# Patient Record
Sex: Female | Born: 1995 | Race: Asian | Hispanic: No | Marital: Single | Smoking: Never smoker
Health system: Southern US, Community
[De-identification: ages and names within clinical notes are randomized; demographics above are authoritative.]

## PROBLEM LIST (undated history)

## (undated) ENCOUNTER — Inpatient Hospital Stay (HOSPITAL_COMMUNITY): Payer: Self-pay

## (undated) DIAGNOSIS — R51 Headache: Secondary | ICD-10-CM

## (undated) DIAGNOSIS — F329 Major depressive disorder, single episode, unspecified: Secondary | ICD-10-CM

## (undated) DIAGNOSIS — R519 Headache, unspecified: Secondary | ICD-10-CM

## (undated) DIAGNOSIS — R87629 Unspecified abnormal cytological findings in specimens from vagina: Secondary | ICD-10-CM

## (undated) DIAGNOSIS — F419 Anxiety disorder, unspecified: Secondary | ICD-10-CM

## (undated) DIAGNOSIS — F32A Depression, unspecified: Secondary | ICD-10-CM

## (undated) DIAGNOSIS — I10 Essential (primary) hypertension: Secondary | ICD-10-CM

## (undated) DIAGNOSIS — O139 Gestational [pregnancy-induced] hypertension without significant proteinuria, unspecified trimester: Secondary | ICD-10-CM

## (undated) HISTORY — PX: OTHER SURGICAL HISTORY: SHX169

## (undated) HISTORY — DX: Depression, unspecified: F32.A

## (undated) HISTORY — DX: Essential (primary) hypertension: I10

## (undated) HISTORY — DX: Major depressive disorder, single episode, unspecified: F32.9

## (undated) HISTORY — DX: Unspecified abnormal cytological findings in specimens from vagina: R87.629

## (undated) HISTORY — DX: Headache: R51

## (undated) HISTORY — DX: Headache, unspecified: R51.9

## (undated) HISTORY — DX: Gestational (pregnancy-induced) hypertension without significant proteinuria, unspecified trimester: O13.9

## (undated) HISTORY — DX: Anxiety disorder, unspecified: F41.9

---
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
## 2014-01-11 ENCOUNTER — Encounter: Payer: Self-pay | Admitting: Obstetrics & Gynecology

## 2014-01-11 ENCOUNTER — Ambulatory Visit (INDEPENDENT_AMBULATORY_CARE_PROVIDER_SITE_OTHER): Payer: Medicaid Other | Admitting: Obstetrics & Gynecology

## 2014-01-11 ENCOUNTER — Ambulatory Visit: Payer: Medicaid Other | Admitting: Obstetrics & Gynecology

## 2014-01-11 VITALS — BP 114/86 | Temp 98.5°F | Ht 61.0 in | Wt 221.0 lb

## 2014-01-11 DIAGNOSIS — Z09 Encounter for follow-up examination after completed treatment for conditions other than malignant neoplasm: Secondary | ICD-10-CM

## 2014-01-11 NOTE — Progress Notes (Signed)
Patient ID: Monica [name], female   DOB: 04/09/1996, 18 y.o.   MRN: [id number]  Chief Complaint  Patient presents with  . Nexplanon Stitches Removal    HPI Monica [name] is a 18 y.o. female.  HPI  History reviewed. No pertinent past medical history.  Past Surgical History  Procedure Laterality Date  . Knee srthoscopy N/A   . Arthoscopic knee Right 2013  . Cesarean section N/A 11/05/2012    Procedure: CESAREAN SECTION;  Surgeon: Antionette CharLisa Jackson-Moore, MD;  Location: [facility];  Service: Obstetrics;  Laterality: N/A;    Family History  Problem Relation Age of Onset  . Heart attack Maternal Grandmother   . Hypertension Maternal Grandmother   . Cancer Neg Hx   . Diabetes Neg Hx   . Healthy Brother     x1  . Healthy Sister     x1`    Social History History  Substance Use Topics  . Smoking status: Never Smoker   . Smokeless tobacco: Never Used  . Alcohol Use: Yes     Comment: sometimes     Allergies  Allergen Reactions  . Shellfish Allergy Hives and Rash    Current Outpatient Prescriptions  Medication Sig Dispense Refill  . cetirizine (ZYRTEC) 10 MG tablet Take 10 mg by mouth daily.       No current facility-administered medications for this visit.    Review of Systems Review of Systems Constitutional: negative for fatigue and weight loss Respiratory: negative for cough and wheezing Cardiovascular: negative for chest pain, fatigue and palpitations Gastrointestinal: negative for abdominal pain and change in bowel habits Genitourinary:negative for abnormal vaginal discharge Integument/breast: negative for nipple discharge Musculoskeletal: positive for pain radiating down back/buttock Neurological: negative for gait problems and tremors Behavioral/Psych: negative for abusive relationship, depression Endocrine: negative for temperature intolerance     Blood pressure 114/86, temperature 98.5 F (36.9 C), height 5\' 1"  (1.549 m), weight 100.245 kg (221 lb), not currently  breastfeeding.  Physical Exam Physical Exam General:   alert  Skin:   Nexplanon site healing well; suture removed      Data Reviewed None  Assessment    S/P suture removal ?radicular back pain     Plan    Possible management options include: supportive measures reviewed Follow up as needed.         JACKSON-MOORE,Monica [name] A 01/11/2014, 3:40 PM

## 2014-01-11 NOTE — Patient Instructions (Signed)
Sciatica Sciatica is pain, weakness, numbness, or tingling along the path of the sciatic nerve. The nerve starts in the lower back and runs down the back of each leg. The nerve controls the muscles in the lower leg and in the back of the knee, while also providing sensation to the back of the thigh, lower leg, and the sole of your foot. Sciatica is a symptom of another medical condition. For instance, nerve damage or certain conditions, such as a herniated disk or bone spur on the spine, pinch or put pressure on the sciatic nerve. This causes the pain, weakness, or other sensations normally associated with sciatica. Generally, sciatica only affects one side of the body. CAUSES   Herniated or slipped disc.  Degenerative disk disease.  A pain disorder involving the narrow muscle in the buttocks (piriformis syndrome).  Pelvic injury or fracture.  Pregnancy.  Tumor (rare). SYMPTOMS  Symptoms can vary from mild to very severe. The symptoms usually travel from the low back to the buttocks and down the back of the leg. Symptoms can include:  Mild tingling or dull aches in the lower back, leg, or hip.  Numbness in the back of the calf or sole of the foot.  Burning sensations in the lower back, leg, or hip.  Sharp pains in the lower back, leg, or hip.  Leg weakness.  Severe back pain inhibiting movement. These symptoms may get worse with coughing, sneezing, laughing, or prolonged sitting or standing. Also, being overweight may worsen symptoms. DIAGNOSIS  Your caregiver will perform a physical exam to look for common symptoms of sciatica. He or she may ask you to do certain movements or activities that would trigger sciatic nerve pain. Other tests may be performed to find the cause of the sciatica. These may include:  Blood tests.  X-rays.  Imaging tests, such as an MRI or CT scan. TREATMENT  Treatment is directed at the cause of the sciatic pain. Sometimes, treatment is not necessary  and the pain and discomfort goes away on its own. If treatment is needed, your caregiver may suggest:  Over-the-counter medicines to relieve pain.  Prescription medicines, such as anti-inflammatory medicine, muscle relaxants, or narcotics.  Applying heat or ice to the painful area.  Steroid injections to lessen pain, irritation, and inflammation around the nerve.  Reducing activity during periods of pain.  Exercising and stretching to strengthen your abdomen and improve flexibility of your spine. Your caregiver may suggest losing weight if the extra weight makes the back pain worse.  Physical therapy.  Surgery to eliminate what is pressing or pinching the nerve, such as a bone spur or part of a herniated disk. HOME CARE INSTRUCTIONS   Only take over-the-counter or prescription medicines for pain or discomfort as directed by your caregiver.  Apply ice to the affected area for 20 minutes, 3-4 times a day for the first 48-72 hours. Then try heat in the same way.  Exercise, stretch, or perform your usual activities if these do not aggravate your pain.  Attend physical therapy sessions as directed by your caregiver.  Keep all follow-up appointments as directed by your caregiver.  Do not wear high heels or shoes that do not provide proper support.  Check your mattress to see if it is too soft. A firm mattress may lessen your pain and discomfort. SEEK IMMEDIATE MEDICAL CARE IF:   You lose control of your bowel or bladder (incontinence).  You have increasing weakness in the lower back, pelvis, buttocks,   it is too soft. A firm mattress may lessen your pain and discomfort.  SEEK IMMEDIATE MEDICAL CARE IF:   · You lose control of your bowel or bladder (incontinence).  · You have increasing weakness in the lower back, pelvis, buttocks, or legs.  · You have redness or swelling of your back.  · You have a burning sensation when you urinate.  · You have pain that gets worse when you lie down or awakens you at night.  · Your pain is worse than you have experienced in the past.  · Your pain is lasting longer than 4 weeks.  · You are suddenly losing weight without reason.  MAKE SURE YOU:  · Understand these  instructions.  · Will watch your condition.  · Will get help right away if you are not doing well or get worse.  Document Released: 05/06/2001 Document Revised: 11/11/2011 Document Reviewed: 09/21/2011  Exit[employer]® Patient Information ©2015 [employer]. This information is not intended to replace advice given to you by your health care provider. Make sure you discuss any questions you have with your health care provider.  Sciatica  Sciatica is pain, weakness, numbness, or tingling along the path of the sciatic nerve. The nerve starts in the lower back and runs down the back of each leg. The nerve controls the muscles in the lower leg and in the back of the knee, while also providing sensation to the back of the thigh, lower leg, and the sole of your foot. Sciatica is a symptom of another medical condition. For instance, nerve damage or certain conditions, such as a herniated disk or bone spur on the spine, pinch or put pressure on the sciatic nerve. This causes the pain, weakness, or other sensations normally associated with sciatica. Generally, sciatica only affects one side of the body.  CAUSES   · Herniated or slipped disc.  · Degenerative disk disease.  · A pain disorder involving the narrow muscle in the buttocks (piriformis syndrome).  · Pelvic injury or fracture.  · Pregnancy.  · Tumor (rare).  SYMPTOMS   Symptoms can vary from mild to very severe. The symptoms usually travel from the low back to the buttocks and down the back of the leg. Symptoms can include:  · Mild tingling or dull aches in the lower back, leg, or hip.  · Numbness in the back of the calf or sole of the foot.  · Burning sensations in the lower back, leg, or hip.  · Sharp pains in the lower back, leg, or hip.  · Leg weakness.  · Severe back pain inhibiting movement.  These symptoms may get worse with coughing, sneezing, laughing, or prolonged sitting or standing. Also, being overweight may worsen symptoms.  DIAGNOSIS   Your caregiver will  perform a physical exam to look for common symptoms of sciatica. He or she may ask you to do certain movements or activities that would trigger sciatic nerve pain. Other tests may be performed to find the cause of the sciatica. These may include:  · Blood tests.  · X-rays.  · Imaging tests, such as an MRI or CT scan.  TREATMENT   Treatment is directed at the cause of the sciatic pain. Sometimes, treatment is not necessary and the pain and discomfort goes away on its own. If treatment is needed, your caregiver may suggest:  · Over-the-counter medicines to relieve pain.  · Prescription medicines, such as anti-inflammatory medicine, muscle relaxants, or narcotics.  · Applying heat or   ice to the painful area.  · Steroid injections to lessen pain, irritation, and inflammation around the nerve.  · Reducing activity during periods of pain.  · Exercising and stretching to strengthen your abdomen and improve flexibility of your spine. Your caregiver may suggest losing weight if the extra weight makes the back pain worse.  · Physical therapy.  · Surgery to eliminate what is pressing or pinching the nerve, such as a bone spur or part of a herniated disk.  HOME CARE INSTRUCTIONS   · Only take over-the-counter or prescription medicines for pain or discomfort as directed by your caregiver.  · Apply ice to the affected area for 20 minutes, 3-4 times a day for the first 48-72 hours. Then try heat in the same way.  · Exercise, stretch, or perform your usual activities if these do not aggravate your pain.  · Attend physical therapy sessions as directed by your caregiver.  · Keep all follow-up appointments as directed by your caregiver.  · Do not wear high heels or shoes that do not provide proper support.  · Check your mattress to see if it is too soft. A firm mattress may lessen your pain and discomfort.  SEEK IMMEDIATE MEDICAL CARE IF:   · You lose control of your bowel or bladder (incontinence).  · You have increasing weakness in  the lower back, pelvis, buttocks, or legs.  · You have redness or swelling of your back.  · You have a burning sensation when you urinate.  · You have pain that gets worse when you lie down or awakens you at night.  · Your pain is worse than you have experienced in the past.  · Your pain is lasting longer than 4 weeks.  · You are suddenly losing weight without reason.  MAKE SURE YOU:  · Understand these instructions.  · Will watch your condition.  · Will get help right away if you are not doing well or get worse.  Document Released: 05/06/2001 Document Revised: 11/11/2011 Document Reviewed: 09/21/2011  [employer]® Patient Information ©2015 [employer]. This information is not intended to replace advice given to you by your health care provider. Make sure you discuss any questions you have with your health care provider.

## 2014-01-23 ENCOUNTER — Ambulatory Visit: Payer: Medicaid Other | Admitting: Obstetrics & Gynecology

## 2014-01-24 ENCOUNTER — Telehealth: Payer: Self-pay | Admitting: *Deleted

## 2014-01-24 NOTE — Telephone Encounter (Signed)
Error

## 2014-01-25 ENCOUNTER — Other Ambulatory Visit: Payer: Self-pay | Admitting: Obstetrics & Gynecology

## 2014-01-25 ENCOUNTER — Ambulatory Visit (INDEPENDENT_AMBULATORY_CARE_PROVIDER_SITE_OTHER): Payer: Medicaid Other | Admitting: Obstetrics & Gynecology

## 2014-01-25 VITALS — BP 113/77 | HR 88 | Temp 98.4°F | Wt 223.0 lb

## 2014-01-25 DIAGNOSIS — Z3049 Encounter for surveillance of other contraceptives: Secondary | ICD-10-CM

## 2014-01-25 DIAGNOSIS — N939 Abnormal uterine and vaginal bleeding, unspecified: Secondary | ICD-10-CM

## 2014-01-25 DIAGNOSIS — N926 Irregular menstruation, unspecified: Secondary | ICD-10-CM

## 2014-01-25 DIAGNOSIS — Z30013 Encounter for initial prescription of injectable contraceptive: Secondary | ICD-10-CM

## 2014-01-25 DIAGNOSIS — Z30432 Encounter for removal of intrauterine contraceptive device: Secondary | ICD-10-CM

## 2014-01-25 MED ORDER — MEDROXYPROGESTERONE ACETATE 150 MG/ML IM SUSP: 150.0000 mg | INTRAMUSCULAR | Status: DC

## 2014-01-25 MED ORDER — MEDROXYPROGESTERONE ACETATE 150 MG/ML IM SUSP
150.0000 mg | INTRAMUSCULAR | Status: AC
  Administered 2014-01-25 – 2014-10-03 (×3): 150 mg via INTRAMUSCULAR

## 2014-01-26 LAB — WET PREP BY MOLECULAR PROBE
Candida species: NEGATIVE
Gardnerella vaginalis: POSITIVE — AB
Trichomonas vaginosis: NEGATIVE

## 2014-01-26 LAB — GC/CHLAMYDIA PROBE AMP
CT Probe RNA: NEGATIVE
GC Probe RNA: NEGATIVE

## 2014-01-27 ENCOUNTER — Encounter: Payer: Self-pay | Admitting: Obstetrics & Gynecology

## 2014-01-27 NOTE — Progress Notes (Signed)
Patient ID: Monica Monica [name], female   DOB: 16-Sep-1995, 18 y.o.   MRN: [id number]  Chief Complaint  Patient presents with  . Menstrual Problem    bleeding with IUD    HPI Monica Monica [name] is Monica [name] 18 y.o. female.  Vaginal Bleeding The patient's primary symptoms include pelvic pain and vaginal bleeding. This is Monica [name] new problem. The current episode started more than 1 month ago. The problem occurs constantly. The problem has been unchanged. The pain is moderate. The problem affects both sides. Pertinent negatives include no constipation, diarrhea, discolored urine, frequency, nausea, painful intercourse or urgency. The vaginal bleeding is lighter than menses. She has not been passing clots. She has not been passing tissue. She has tried nothing for the symptoms. She uses an IUD for contraception. Her menstrual history has been irregular.    No past medical history on file.  Past Surgical History  Procedure Laterality Date  . Knee srthoscopy N/Monica [name]   . Arthoscopic knee Right 2013  . Cesarean section N/Monica [name] 11/05/2012    Procedure: CESAREAN SECTION;  Surgeon: Antionette Char, MD;  Location: [facility];  Service: Obstetrics;  Laterality: N/Monica [name];    Family History  Problem Relation Age of Onset  . Heart attack Maternal Grandmother   . Hypertension Maternal Grandmother   . Cancer Neg Hx   . Diabetes Neg Hx   . Healthy Brother     x1  . Healthy Sister     x1`    Social History History  Substance Use Topics  . Smoking status: Never Smoker   . Smokeless tobacco: Never Used  . Alcohol Use: Yes     Comment: sometimes     Allergies  Allergen Reactions  . Shellfish Allergy Hives and Rash    Current Outpatient Prescriptions  Medication Sig Dispense Refill  . cetirizine (ZYRTEC) 10 MG tablet Take 10 mg by mouth daily.      . medroxyPROGESTERone (DEPO-PROVERA) 150 MG/ML injection Inject 1 mL (150 mg total) into the muscle every 3 (three) months.  1 mL  3   Current Facility-Administered Medications   Medication Dose Route Frequency Provider Last Rate Last Dose  . medroxyPROGESTERone (DEPO-PROVERA) injection 150 mg  150 mg Intramuscular Q90 days Antionette Char, MD   150 mg at 01/25/14 1737    Review of Systems Review of Systems  Gastrointestinal: Negative for nausea, diarrhea and constipation.  Genitourinary: Positive for vaginal bleeding and pelvic pain. Negative for urgency and frequency.   Constitutional: negative for fatigue and weight loss Respiratory: negative for cough and wheezing Cardiovascular: negative for chest pain, fatigue and palpitations Gastrointestinal: negative for abdominal pain and change in bowel habits Integument/breast: negative for nipple discharge Musculoskeletal:negative for myalgias Neurological: negative for gait problems and tremors Behavioral/Psych: negative for abusive relationship, depression Endocrine: negative for temperature intolerance     Blood pressure 113/77, pulse 88, temperature 98.4 F (36.9 C), weight 101.152 kg (223 lb), last menstrual period 01/18/2014, not currently breastfeeding.  Physical Exam Physical Exam General:   alert  Skin:   no rash or abnormalities  Lungs:   clear to auscultation bilaterally  Heart:   regular rate and rhythm, S1, S2 normal, no murmur, click, rub or gallop  Abdomen:  normal findings: no organomegaly, soft, non-tender and no hernia  Pelvis:  External genitalia: normal general appearance Urinary system: urethral meatus normal and bladder without fullness, nontender Vaginal: normal without tenderness, induration or masses Cervix: normal appearance; IUD strings seen Adnexa: normal bimanual exam Uterus: anteverted  and non-tender, normal size    Pelvic U/S performed w/ 3-D coronal views of the endometrial cavity: 2 cm cyst on left ovary; IUD in an oblique position--? Myometrial penetration of the distal ends of the T, the IUD is in the lower 1/2 of the cavity   The IUD was removed intact Data  Reviewed UPT  Assessment    Malpositioned IUD; now s/p removal     Plan    Orders Placed This Encounter  Procedures  . GC/Chlamydia Probe Amp  . WET PREP BY MOLECULAR PROBE   Meds ordered this encounter  Medications  . medroxyPROGESTERone (DEPO-PROVERA) 150 MG/ML injection    Sig: Inject 1 mL (150 mg total) into the muscle every 3 (three) months.    Dispense:  1 mL    Refill:  3  . medroxyPROGESTERone (DEPO-PROVERA) injection 150 mg    Sig:     Follow up as needed.         JACKSON-MOORE,Monica Monica [name] 01/27/2014, 5:40 PM

## 2014-01-27 NOTE — Patient Instructions (Signed)

## 2014-02-02 ENCOUNTER — Ambulatory Visit: Payer: Medicaid Other | Admitting: Obstetrics & Gynecology

## 2014-02-03 ENCOUNTER — Other Ambulatory Visit: Payer: Self-pay | Admitting: *Deleted

## 2014-02-03 DIAGNOSIS — B9689 Other specified bacterial agents as the cause of diseases classified elsewhere: Secondary | ICD-10-CM

## 2014-02-03 DIAGNOSIS — N760 Acute vaginitis: Principal | ICD-10-CM

## 2014-02-03 MED ORDER — METRONIDAZOLE 500 MG PO TABS
500.0000 mg | ORAL_TABLET | Freq: Two times a day (BID) | ORAL | Status: DC
Start: 2014-02-03 — End: 2014-04-18

## 2014-03-27 ENCOUNTER — Encounter: Payer: Self-pay | Admitting: Obstetrics & Gynecology

## 2014-04-06 ENCOUNTER — Ambulatory Visit: Payer: Medicaid Other | Admitting: Obstetrics & Gynecology

## 2014-04-18 ENCOUNTER — Ambulatory Visit (INDEPENDENT_AMBULATORY_CARE_PROVIDER_SITE_OTHER): Payer: Medicaid Other | Admitting: *Deleted

## 2014-04-18 VITALS — BP 114/78 | HR 71 | Ht 61.0 in | Wt 222.0 lb

## 2014-04-18 DIAGNOSIS — Z3042 Encounter for surveillance of injectable contraceptive: Secondary | ICD-10-CM

## 2014-04-18 DIAGNOSIS — Z30013 Encounter for initial prescription of injectable contraceptive: Secondary | ICD-10-CM

## 2014-04-18 NOTE — Progress Notes (Signed)
Patient is in the office today for her DEPO Injection. Patient is on time for her Injection. Injection given in Right Upper Outer Quadrant. Patient tolerated well. Patient notified to make an appointment with the front for July 10, 2013 for her next Depo Injection. Patient voiced understanding.  BP 114/78 mmHg  Pulse 71  Ht 5\' 1"  (1.549 m)  Wt 222 lb (100.699 kg)  BMI 41.97 kg/m2   Administrations This Visit    medroxyPROGESTERone (DEPO-PROVERA) injection 150 mg    Administered Action Dose Route Administered By         04/18/2014 Given 150 mg Intramuscular Odessa FlemingKristina M Bess Saltzman, LPN

## 2014-05-17 ENCOUNTER — Encounter: Payer: Self-pay | Admitting: Obstetrics & Gynecology

## 2014-05-17 ENCOUNTER — Ambulatory Visit: Payer: Medicaid Other | Admitting: Obstetrics & Gynecology

## 2014-05-17 ENCOUNTER — Ambulatory Visit (INDEPENDENT_AMBULATORY_CARE_PROVIDER_SITE_OTHER): Payer: Medicaid Other | Admitting: Obstetrics & Gynecology

## 2014-05-17 VITALS — BP 131/86 | HR 106 | Temp 99.1°F | Ht 62.0 in | Wt 228.0 lb

## 2014-05-17 DIAGNOSIS — Z0000 Encounter for general adult medical examination without abnormal findings: Secondary | ICD-10-CM

## 2014-05-17 DIAGNOSIS — Z01419 Encounter for gynecological examination (general) (routine) without abnormal findings: Secondary | ICD-10-CM

## 2014-05-17 NOTE — Progress Notes (Signed)
Subjective:     Monica [name] is a 18 y.o. female here for a routine exam.      Personal health questionnaire:  Is patient Ashkenazi Jewish, have a family history of breast and/or ovarian cancer: no Is there a family history of uterine cancer diagnosed at age < 7950, gastrointestinal cancer, urinary tract cancer, family member who is a Personnel officerLynch syndrome-associated carrier: no Is the patient overweight and hypertensive, family history of diabetes, personal history of gestational diabetes or PCOS: yes Is patient over 1255, have PCOS,  family history of premature CHD under age 19, diabetes, smoke, have hypertension or peripheral artery disease:  no At any time, has a partner hit, kicked or otherwise hurt or frightened you?: no Over the past 2 weeks, have you felt down, depressed or hopeless?: no Over the past 2 weeks, have you felt little interest or pleasure in doing things?:no   Gynecologic History No LMP recorded. Patient has had an injection. Contraception: Depo-Provera injections   Obstetric History OB History  Gravida Para Term Preterm AB SAB TAB Ectopic Multiple Living  1 1 1       1     # Outcome Date GA Lbr Len/2nd Weight Sex Delivery Anes PTL Lv  1 Term 11/05/12 [id number]  3.615 kg (7 lb 15.5 oz) F CS-LTranv Spinal  Y      History reviewed. No pertinent past medical history.  Past Surgical History  Procedure Laterality Date  . Knee srthoscopy N/A   . Arthoscopic knee Right 2013  . Cesarean section N/A 11/05/2012    Procedure: CESAREAN SECTION;  Surgeon: Antionette CharLisa Jackson-Moore, MD;  Location: [facility];  Service: Obstetrics;  Laterality: N/A;    Current outpatient prescriptions: cetirizine (ZYRTEC) 10 MG tablet, Take 10 mg by mouth daily., Disp: , Rfl: ;  medroxyPROGESTERone (DEPO-PROVERA) 150 MG/ML injection, Inject 1 mL (150 mg total) into the muscle every 3 (three) months., Disp: 1 mL, Rfl: 3 Current facility-administered medications: medroxyPROGESTERone (DEPO-PROVERA) injection 150 mg,  150 mg, Intramuscular, Q90 days, Antionette CharLisa Jackson-Moore, MD, 150 mg at 04/18/14 0920 Allergies  Allergen Reactions  . Shellfish Allergy Hives and Rash    History  Substance Use Topics  . Smoking status: Never Smoker   . Smokeless tobacco: Never Used  . Alcohol Use: 0.0 oz/week    0 Not specified per week     Comment: sometimes     Family History  Problem Relation Age of Onset  . Heart attack Maternal Grandmother   . Hypertension Maternal Grandmother   . Cancer Neg Hx   . Diabetes Neg Hx   . Healthy Brother     x1  . Healthy Sister     x1`      Review of Systems  Constitutional: negative for fatigue and weight loss Respiratory: negative for cough and wheezing Cardiovascular: negative for chest pain, fatigue and palpitations Gastrointestinal: negative for abdominal pain and change in bowel habits Musculoskeletal:negative for myalgias Neurological: negative for gait problems and tremors Behavioral/Psych: negative for abusive relationship, depression Endocrine: negative for temperature intolerance   Genitourinary:negative for abnormal menstrual periods, genital lesions, hot flashes, sexual problems and vaginal discharge Integument/breast: negative for breast lump, breast tenderness, nipple discharge and skin lesion(s)    Objective:       BP 131/86 mmHg  Pulse 106  Temp(Src) 99.1 F (37.3 C)  Ht 5\' 2"  (1.575 m)  Wt 103.42 kg (228 lb)  BMI 41.69 kg/m2 General:   alert  Skin:   no rash or  abnormalities  Lungs:   clear to auscultation bilaterally  Heart:   regular rate and rhythm, S1, S2 normal, no murmur, click, rub or gallop  Breasts:   normal without suspicious masses, skin or nipple changes or axillary nodes  Abdomen:  normal findings: no organomegaly, soft, non-tender and no hernia  Pelvis:  External genitalia: normal general appearance Urinary system: urethral meatus normal and bladder without fullness, nontender Vaginal: normal without tenderness, induration or  masses Cervix: normal appearance Adnexa: normal bimanual exam Uterus: anteverted and non-tender, normal size   Lab Review  Labs reviewed no Radiologic studies reviewed no    Assessment:    Healthy female exam.   Has received the HPV vaccine Plan:    Education reviewed: calcium supplements, low fat, low cholesterol diet, safe sex/STD prevention and weight bearing exercise.    Follow up as needed.

## 2014-05-19 NOTE — Patient Instructions (Signed)

## 2014-05-22 ENCOUNTER — Encounter: Payer: Self-pay | Admitting: *Deleted

## 2014-05-23 ENCOUNTER — Encounter: Payer: Self-pay | Admitting: Obstetrics & Gynecology

## 2014-07-11 ENCOUNTER — Ambulatory Visit (INDEPENDENT_AMBULATORY_CARE_PROVIDER_SITE_OTHER): Payer: Medicaid Other | Admitting: *Deleted

## 2014-07-11 VITALS — BP 105/72 | HR 77 | Temp 98.4°F | Ht 61.0 in | Wt 215.0 lb

## 2014-07-11 DIAGNOSIS — Z3042 Encounter for surveillance of injectable contraceptive: Secondary | ICD-10-CM

## 2014-07-11 NOTE — Progress Notes (Signed)
Patient is doing well with her depo. Patient reports no bleeding. She is actively trying to loss weight and doing good with that. Blood pressure 105/72, pulse 77, temperature 98.4 F (36.9 C), height 5\' 1"  (1.549 m), weight 215 lb (97.523 kg), not currently breastfeeding. .Marland Kitchen

## 2014-10-03 ENCOUNTER — Ambulatory Visit (INDEPENDENT_AMBULATORY_CARE_PROVIDER_SITE_OTHER): Payer: Medicaid Other | Admitting: *Deleted

## 2014-10-03 VITALS — BP 122/82 | HR 78 | Temp 98.4°F | Ht 61.0 in | Wt 212.0 lb

## 2014-10-03 DIAGNOSIS — Z3042 Encounter for surveillance of injectable contraceptive: Secondary | ICD-10-CM

## 2014-10-03 DIAGNOSIS — Z3049 Encounter for surveillance of other contraceptives: Secondary | ICD-10-CM | POA: Diagnosis not present

## 2014-10-03 DIAGNOSIS — Z30013 Encounter for initial prescription of injectable contraceptive: Secondary | ICD-10-CM

## 2014-10-03 NOTE — Progress Notes (Signed)
Patient in office today for a Depo injection. Patient is on time for her injection. Patient tolerated injection well.  Patient to return to office on December 25, 2014  BP 122/82 mmHg  Pulse 78  Temp(Src) 98.4 F (36.9 C)  Ht 5\' 1"  (1.549 m)  Wt 212 lb (96.163 kg)  BMI 40.08 kg/m2  Breastfeeding? No   Administrations This Visit    medroxyPROGESTERone (DEPO-PROVERA) injection 150 mg    Admin Date Action Dose Route Administered By         10/03/2014 Given 150 mg Intramuscular Henriette CombsAndrea L Harrington Jobe, LPN

## 2014-12-25 ENCOUNTER — Ambulatory Visit: Payer: Medicaid Other

## 2016-02-09 ENCOUNTER — Emergency Department (HOSPITAL_COMMUNITY)
Admission: EM | Admit: 2016-02-09 | Discharge: 2016-02-09 | Disposition: A | Discharge status: Home / Self Care | Payer: Medicaid Other | Attending: Emergency Medicine | Admitting: Emergency Medicine

## 2016-02-09 ENCOUNTER — Emergency Department (HOSPITAL_COMMUNITY): Payer: Medicaid Other

## 2016-02-09 ENCOUNTER — Encounter (HOSPITAL_COMMUNITY): Payer: Self-pay | Admitting: Emergency Medicine

## 2016-02-09 DIAGNOSIS — Y999 Unspecified external cause status: Secondary | ICD-10-CM | POA: Insufficient documentation

## 2016-02-09 DIAGNOSIS — M25561 Pain in right knee: Secondary | ICD-10-CM | POA: Diagnosis not present

## 2016-02-09 DIAGNOSIS — W101XXA Fall (on)(from) sidewalk curb, initial encounter: Secondary | ICD-10-CM | POA: Insufficient documentation

## 2016-02-09 DIAGNOSIS — S93402A Sprain of unspecified ligament of left ankle, initial encounter: Secondary | ICD-10-CM

## 2016-02-09 DIAGNOSIS — Y9289 Other specified places as the place of occurrence of the external cause: Secondary | ICD-10-CM | POA: Insufficient documentation

## 2016-02-09 DIAGNOSIS — Y939 Activity, unspecified: Secondary | ICD-10-CM | POA: Diagnosis not present

## 2016-02-09 DIAGNOSIS — S99912A Unspecified injury of left ankle, initial encounter: Secondary | ICD-10-CM | POA: Diagnosis present

## 2016-02-09 MED ORDER — NAPROXEN 500 MG PO TABS: 500.0000 mg | ORAL_TABLET | Freq: Two times a day (BID) | ORAL | 0 refills | Status: DC | PRN

## 2016-02-09 NOTE — ED Notes (Signed)
MD at bedside. EDPA PRESENT 

## 2016-02-09 NOTE — Discharge Instructions (Signed)
Be sure to read and understand instructions below prior to leaving the hospital. If your symptoms persist without any improvement in 1-2 weeks it is recommended that you follow up with the orthopedist listed above. Naproxen for pain as needed.   Ankle Sprain  An ankle sprain is an injury to the ligaments that hold the ankle joint together. Your X-ray today showed no evidence of fracture, however keep all follow-up appointments with an orthopedic specialist to have follow-up X-rays, because fractures may not appear until 3 days after the acute injury.    TREATMENT  Rest, ice, elevation, and compression are the basic modes of treatment.    HOME CARE INSTRUCTIONS  Apply ice to the sore area for 15 to 20 minutes, 3 to 4 times per day. Do this while you are awake for the first 2 days. This can be stopped when the swelling goes away. Put the ice in a plastic bag and place a towel between the bag of ice and your skin.  Keep your leg elevated when possible to lessen swelling.  If your caregiver recommends crutches, use them as instructed for 1 week. Then, you may walk on your ankle weight bearing as tolerated.  You may take off your ankle stabilizer at night and to take a shower or bath. Wiggle your toes in the splint several times per day if you are able.  Do not drive a vehicle on pain medication. ACTIVITY:            - Weight bearing as tolerated            - Exercises should be limited to pain free range of motion            - Can start mobilization by tracing the alphabet with your foot in the air.       SEEK MEDICAL CARE IF:  You have an increase in bruising, swelling, or pain.  Your toes feel cold.  Pain relief is not achieved with medications.  EMERGENCY:: Your toes are numb or blue or you have severe pain.   MAKE SURE YOU:  Understand these instructions.  Will watch your condition.  Will get help right away if you are not doing well or get worse   COLD THERAPY DIRECTIONS:  Ice or  gel packs can be used to reduce both pain and swelling. Ice is the most helpful within the first 24 to 48 hours after an injury or flareup from overusing a muscle or joint.  Ice is effective, has very few side effects, and is safe for most people to use.   If you expose your skin to cold temperatures for too long or without the proper protection, you can damage your skin or nerves. Watch for signs of skin damage due to cold.   HOME CARE INSTRUCTIONS  Follow these tips to use ice and cold packs safely.  Place a dry or damp towel between the ice and skin. A damp towel will cool the skin more quickly, so you may need to shorten the time that the ice is used.  For a more rapid response, add gentle compression to the ice.  Ice for no more than 10 to 20 minutes at a time. The bonier the area you are icing, the less time it will take to get the benefits of ice.  Check your skin after 5 minutes to make sure there are no signs of a poor response to cold or skin damage.  Rest 20 minutes  or more in between uses.  Once your skin is numb, you can end your treatment. You can test numbness by very lightly touching your skin. The touch should be so light that you do not see the skin dimple from the pressure of your fingertip. When using ice, most people will feel these normal sensations in this order: cold, burning, aching, and numbness.

## 2016-02-09 NOTE — ED Notes (Signed)
Crutches and aso with pt. Good CMS intact with left foot

## 2016-02-09 NOTE — ED Provider Notes (Signed)
[facility]-EMERGENCY DEPT Provider Note   CSN: [id number] Arrival date & time: 02/09/16  1225  By signing my name below, I, Monica [name], attest that this documentation has been prepared under the direction and in the presence of non-physician practitioner, Elizabeth SauerJaime Morrisa Aldaba, PA-C. Electronically Signed: Nelwyn SalisburyJoshua [name], Scribe. 02/09/2016. 12:35 PM.  History   Chief Complaint Chief Complaint  Patient presents with  . Ankle Injury   The history is provided by the patient. No language interpreter was used.    HPI Comments:  Monica [name] is a 20 y.o. female with no pertinent PMHx who presents to the Emergency Department complaining of sudden-onset unchanged left ankle pain onset last night. Pt reports that she stepped down from a curb when she injured her ankle and fell. She has tried icing the location with minimal relief. No medications taken prior to arrival for symptoms. Pt notes that her pain is exacerbated by palpation and movement. She reports associated wound to her right knee which she also hit during the fall. She denies any numbness or weakness.  History reviewed. No pertinent past medical history.  There are no active problems to display for this patient.   Past Surgical History:  Procedure Laterality Date  . arthoscopic knee Right 2013  . CESAREAN SECTION N/A 11/05/2012   Procedure: CESAREAN SECTION;  Surgeon: Antionette CharLisa Jackson-Moore, MD;  Location: [facility];  Service: Obstetrics;  Laterality: N/A;  . knee srthoscopy N/A     OB History    Gravida Para Term Preterm AB Living   1 1 1     1    SAB TAB Ectopic Multiple Live Births           1       Home Medications    Prior to Admission medications   Medication Sig Start Date End Date Taking? Authorizing Provider  cetirizine (ZYRTEC) 10 MG tablet Take 10 mg by mouth daily.    Historical Provider, MD  medroxyPROGESTERone (DEPO-PROVERA) 150 MG/ML injection Inject 1 mL (150 mg total) into the muscle every 3 (three) months. 01/25/14   Antionette CharLisa  Jackson-Moore, MD  naproxen (NAPROSYN) 500 MG tablet Take 1 tablet (500 mg total) by mouth 2 (two) times daily as needed. 02/09/16   Chase PicketJaime Pilcher Kepler Mccabe, PA-C    Family History Family History  Problem Relation Age of Onset  . Heart attack Maternal Grandmother   . Hypertension Maternal Grandmother   . Cancer Neg Hx   . Diabetes Neg Hx   . Healthy Brother     x1  . Healthy Sister     x1`    Social History Social History  Substance Use Topics  . Smoking status: Never Smoker  . Smokeless tobacco: Never Used  . Alcohol use 0.0 oz/week     Comment: sometimes      Allergies   Shellfish allergy   Review of Systems Review of Systems  Musculoskeletal: Positive for arthralgias and joint swelling.  Skin: Positive for wound.  Neurological: Negative for weakness and numbness.     Physical Exam Updated Vital Signs BP 134/85 (BP Location: Left Arm)   Pulse 97   Temp 98 F (36.7 C) (Oral)   Resp 18   Ht 5\' 2"  (1.575 m)   Wt 99.8 kg   LMP 01/19/2016   SpO2 100%   BMI 40.24 kg/m   Physical Exam  Constitutional: She is oriented to person, place, and time. She appears well-developed and well-nourished. No distress.  HENT:  Head: Normocephalic and atraumatic.  Cardiovascular: Normal rate, regular rhythm, normal heart sounds and intact distal pulses.   No murmur heard. Pulmonary/Chest: Effort normal and breath sounds normal. No respiratory distress. She has no wheezes. She has no rales.  Musculoskeletal:  Left ankle with swelling and tenderness to lateral malleolus. No deformity appreciated. No TTP or swelling of fore foot or calf. Achilles intact. Good pedal pulse and cap refill of all toes. Wiggling toes without difficulty. Sensation grossly intact. Right knee with tenderness to palpation and superficial abrasions over patella.  No gross deformity noted.Full ROM. Ligaments intact. No abnormal alignment or patellar mobility. No bruising, erythema, or warmth overlaying the joint.  2+ DP pulses bilaterally. All compartments are soft. Sensation intact distal to injury.  Neurological: She is alert and oriented to person, place, and time.  Skin: Skin is warm and dry.  Nursing note and vitals reviewed.    ED Treatments / Results  DIAGNOSTIC STUDIES:  Oxygen Saturation is 100% on RA, normal by my interpretation.    COORDINATION OF CARE:  12:47 PM Discussed treatment plan with pt at bedside which included imaging and pt agreed to plan.  Labs (all labs ordered are listed, but only abnormal results are displayed) Labs Reviewed - No data to display  EKG  EKG Interpretation None       Radiology Dg Ankle Complete Left  Result Date: 02/09/2016 CLINICAL DATA:  Patient is status post fall. Anterior knee pain and ankle pain. Initial encounter. EXAM: LEFT ANKLE COMPLETE - 3+ VIEW COMPARISON:  None. FINDINGS: There is no evidence of fracture, dislocation, or joint effusion. There is no evidence of arthropathy or other focal bone abnormality. Soft tissues are unremarkable. IMPRESSION: No acute osseous abnormality. Electronically Signed   By: Monica Belt M.D.   On: 02/09/2016 13:15   Dg Knee Complete 4 Views Right  Result Date: 02/09/2016 CLINICAL DATA:  Patient status post fall. Knee pain. Initial encounter. EXAM: RIGHT KNEE - COMPLETE 4+ VIEW COMPARISON:  None. FINDINGS: Normal anatomic alignment. Postsurgical changes compatible with prior ACL repair. No joint effusion. Soft tissues unremarkable. IMPRESSION: No acute osseous abnormality. Electronically Signed   By: Monica Belt M.D.   On: 02/09/2016 13:17    Procedures Procedures (including critical care time)  Medications Ordered in ED Medications - No data to display   Initial Impression / Assessment and Plan / ED Course  I have reviewed the triage vital signs and the nursing notes.  Pertinent labs & imaging results that were available during my care of the patient were reviewed by me and considered in my medical  decision making (see chart for details).  Clinical Course   Monica [name] is a 20 y.o. female who presents to ED for left ankle pain and right knee pain after fall yesterday. Bilateral lower extremities are NVI. X-rays obtained which are reassuring. Patient feels as if she needs crutches for ambulation which were provided in ED. ASO brace also provided. Symptomatic home care instructions were discussed and Rx for naproxen given. Reasons to return to ED discussed. PCP follow-up strongly encouraged if symptoms persist greater than 1-2 weeks. All questions answered.  Final Clinical Impressions(s) / ED Diagnoses   Final diagnoses:  Ankle sprain, left, initial encounter  Knee pain, acute, right    New Prescriptions New Prescriptions   NAPROXEN (NAPROSYN) 500 MG TABLET    Take 1 tablet (500 mg total) by mouth 2 (two) times daily as needed.   I personally performed the services described in this documentation,  which was scribed in my presence. The recorded information has been reviewed and is accurate.    [facility] Curlie Sittner, PA-C 02/09/16 1353    Maia Plan, MD 02/09/16 2004

## 2016-02-09 NOTE — ED Triage Notes (Signed)
Pt c/o left ankle pain and edema onset last night after tripping and falling off curb. Pt does not remember specifics of injury. Was using alcohol.

## 2016-03-05 ENCOUNTER — Other Ambulatory Visit (HOSPITAL_COMMUNITY)
Admission: RE | Admit: 2016-03-05 | Discharge: 2016-03-05 | Disposition: A | Discharge status: Home / Self Care | Payer: Medicaid Other | Source: Ambulatory Visit | Attending: Obstetrics | Admitting: Obstetrics

## 2016-03-05 ENCOUNTER — Ambulatory Visit (INDEPENDENT_AMBULATORY_CARE_PROVIDER_SITE_OTHER): Payer: Medicaid Other | Admitting: Obstetrics

## 2016-03-05 ENCOUNTER — Encounter: Payer: Self-pay | Admitting: Obstetrics

## 2016-03-05 VITALS — BP 120/85 | HR 74 | Temp 97.9°F | Wt 221.0 lb

## 2016-03-05 DIAGNOSIS — N760 Acute vaginitis: Secondary | ICD-10-CM | POA: Diagnosis present

## 2016-03-05 DIAGNOSIS — Z1151 Encounter for screening for human papillomavirus (HPV): Secondary | ICD-10-CM | POA: Insufficient documentation

## 2016-03-05 DIAGNOSIS — Z01419 Encounter for gynecological examination (general) (routine) without abnormal findings: Secondary | ICD-10-CM

## 2016-03-05 DIAGNOSIS — Z01411 Encounter for gynecological examination (general) (routine) with abnormal findings: Secondary | ICD-10-CM | POA: Insufficient documentation

## 2016-03-05 DIAGNOSIS — Z3042 Encounter for surveillance of injectable contraceptive: Secondary | ICD-10-CM

## 2016-03-05 DIAGNOSIS — Z124 Encounter for screening for malignant neoplasm of cervix: Secondary | ICD-10-CM

## 2016-03-05 DIAGNOSIS — Z30013 Encounter for initial prescription of injectable contraceptive: Secondary | ICD-10-CM

## 2016-03-05 LAB — POCT URINE PREGNANCY: Preg Test, Ur: NEGATIVE

## 2016-03-05 MED ORDER — MEDROXYPROGESTERONE ACETATE 150 MG/ML IM SUSP: 150.0000 mg | INTRAMUSCULAR | 3 refills | Status: DC

## 2016-03-05 MED ORDER — MEDROXYPROGESTERONE ACETATE 150 MG/ML IM SUSP
150.0000 mg | INTRAMUSCULAR | Status: DC
  Administered 2016-03-05 – 2016-05-29 (×2): 150 mg via INTRAMUSCULAR

## 2016-03-05 NOTE — Addendum Note (Signed)
Addended by: Elby BeckPAUL, Aundra Espin F on: 03/05/2016 10:45 AM   Modules accepted: Orders

## 2016-03-05 NOTE — Progress Notes (Signed)
Subjective:        Monica [name] is a 20 y.o. female here for a routine exam.  Current complaints: None.    Personal health questionnaire:  Is patient Monica [name], have a family history of breast and/or ovarian cancer: no Is there a family history of uterine cancer diagnosed at age < 93, gastrointestinal cancer, urinary tract cancer, family member who is a Personnel officer syndrome-associated carrier: no Is the patient overweight and hypertensive, family history of diabetes, personal history of gestational diabetes, preeclampsia or PCOS: no Is patient over 66, have PCOS,  family history of premature CHD under age 24, diabetes, smoke, have hypertension or peripheral artery disease:  no At any time, has a partner hit, kicked or otherwise hurt or frightened you?: no Over the past 2 weeks, have you felt down, depressed or hopeless?: no Over the past 2 weeks, have you felt little interest or pleasure in doing things?:no   Gynecologic History Patient's last menstrual period was 02/14/2016 (approximate). Contraception: Depo-Provera injections Last Pap: n/a. Results were: n/a Last mammogram: n/a. Results were: n/a  Obstetric History OB History  Gravida Para Term Preterm AB Living  1 1 1     1   SAB TAB Ectopic Multiple Live Births          1    # Outcome Date GA Lbr Len/2nd Weight Sex Delivery Anes PTL Lv  1 Term 11/05/12 [redacted]w[redacted]d  7 lb 15.5 oz (3.615 kg) F CS-LTranv Spinal  LIV      History reviewed. No pertinent past medical history.  Past Surgical History:  Procedure Laterality Date  . arthoscopic knee Right 2013  . CESAREAN SECTION N/A 11/05/2012   Procedure: CESAREAN SECTION;  Surgeon: Antionette Char, MD;  Location: [facility];  Service: Obstetrics;  Laterality: N/A;  . knee srthoscopy N/A      Current Outpatient Prescriptions:  .  cetirizine (ZYRTEC) 10 MG tablet, Take 10 mg by mouth daily., Disp: , Rfl:  .  naproxen (NAPROSYN) 500 MG tablet, Take 1 tablet (500 mg total) by mouth  2 (two) times daily as needed., Disp: 30 tablet, Rfl: 0 .  medroxyPROGESTERone (DEPO-PROVERA) 150 MG/ML injection, Inject 1 mL (150 mg total) into the muscle every 3 (three) months. (Patient not taking: Reported on 03/05/2016), Disp: 1 mL, Rfl: 3 Allergies  Allergen Reactions  . Shellfish Allergy Hives and Rash    Social History  Substance Use Topics  . Smoking status: Never Smoker  . Smokeless tobacco: Never Used  . Alcohol use 0.0 oz/week     Comment: sometimes     Family History  Problem Relation Age of Onset  . Heart attack Maternal Grandmother   . Hypertension Maternal Grandmother   . Healthy Brother     x1  . Healthy Sister     x1`  . Cancer Neg Hx   . Diabetes Neg Hx       Review of Systems  Constitutional: negative for fatigue and weight loss Respiratory: negative for cough and wheezing Cardiovascular: negative for chest pain, fatigue and palpitations Gastrointestinal: negative for abdominal pain and change in bowel habits Musculoskeletal:negative for myalgias Neurological: negative for gait problems and tremors Behavioral/Psych: negative for abusive relationship, depression Endocrine: negative for temperature intolerance   Genitourinary:negative for abnormal menstrual periods, genital lesions, hot flashes, sexual problems and vaginal discharge Integument/breast: negative for breast lump, breast tenderness, nipple discharge and skin lesion(s)    Objective:       BP 120/85  Pulse 74   Temp 97.9 F (36.6 C)   Wt 221 lb (100.2 kg)   LMP 02/14/2016 (Approximate)   BMI 40.42 kg/m  General:   alert  Skin:   no rash or abnormalities  Lungs:   clear to auscultation bilaterally  Heart:   regular rate and rhythm, S1, S2 normal, no murmur, click, rub or gallop  Breasts:   normal without suspicious masses, skin or nipple changes or axillary nodes  Abdomen:  normal findings: no organomegaly, soft, non-tender and no hernia  Pelvis:  External genitalia: normal  general appearance Urinary system: urethral meatus normal and bladder without fullness, nontender Vaginal: normal without tenderness, induration or masses Cervix: normal appearance Adnexa: normal bimanual exam Uterus: anteverted and non-tender, normal size   Lab Review Urine pregnancy test Labs reviewed yes Radiologic studies reviewed no  50% of 20 min visit spent on counseling and coordination of care.   Assessment:    Healthy female exam.    Contraceptive management.  Wants Depo Provera.   Plan:    Depo Provera Rx  Education reviewed: low fat, low cholesterol diet, safe sex/STD prevention, self breast exams and weight bearing exercise. Contraception: Depo-Provera injections. Follow up in: 1 year.   No orders of the defined types were placed in this encounter.  No orders of the defined types were placed in this encounter.     Patient ID: Monica [name], female   DOB: 01/15/1996, 20 y.o.   MRN: [id number]

## 2016-03-05 NOTE — Addendum Note (Signed)
Addended by: Elby BeckPAUL, Oliviana Mcgahee F on: 03/05/2016 10:34 AM   Modules accepted: Orders

## 2016-03-06 LAB — CERVICOVAGINAL ANCILLARY ONLY: Trichomonas: NEGATIVE

## 2016-03-10 LAB — CERVICOVAGINAL ANCILLARY ONLY: Candida vaginitis: NEGATIVE

## 2016-03-10 LAB — CYTOLOGY - PAP
Diagnosis: UNDETERMINED — AB
HPV: DETECTED — AB

## 2016-03-13 ENCOUNTER — Other Ambulatory Visit: Payer: Self-pay | Admitting: Obstetrics

## 2016-03-13 DIAGNOSIS — N760 Acute vaginitis: Principal | ICD-10-CM

## 2016-03-13 DIAGNOSIS — B9689 Other specified bacterial agents as the cause of diseases classified elsewhere: Secondary | ICD-10-CM

## 2016-03-13 MED ORDER — METRONIDAZOLE 500 MG PO TABS: 500.0000 mg | ORAL_TABLET | Freq: Two times a day (BID) | ORAL | 0 refills | Status: DC

## 2016-04-10 ENCOUNTER — Encounter: Payer: Self-pay | Admitting: *Deleted

## 2016-05-07 ENCOUNTER — Encounter: Payer: Self-pay | Admitting: Obstetrics

## 2016-05-07 ENCOUNTER — Ambulatory Visit (INDEPENDENT_AMBULATORY_CARE_PROVIDER_SITE_OTHER): Payer: Medicaid Other | Admitting: Obstetrics

## 2016-05-07 ENCOUNTER — Other Ambulatory Visit: Payer: Self-pay | Admitting: Obstetrics

## 2016-05-07 ENCOUNTER — Encounter: Payer: Self-pay | Admitting: *Deleted

## 2016-05-07 VITALS — BP 113/78 | HR 75 | Temp 98.5°F | Wt 229.9 lb

## 2016-05-07 DIAGNOSIS — R87610 Atypical squamous cells of undetermined significance on cytologic smear of cervix (ASC-US): Secondary | ICD-10-CM | POA: Diagnosis not present

## 2016-05-07 DIAGNOSIS — R87810 Cervical high risk human papillomavirus (HPV) DNA test positive: Secondary | ICD-10-CM

## 2016-05-07 HISTORY — PX: COLPOSCOPY: SHX161

## 2016-05-07 NOTE — Progress Notes (Signed)
Colposcopy Procedure Note  Indications: Pap smear 2 months ago showed: ASCUS with POSITIVE high risk HPV. The prior pap showed n/a.  Prior cervical/vaginal disease: normal exam without visible pathology. Prior cervical treatment: no treatment.  Procedure Details  The risks and benefits of the procedure and Written informed consent obtained.  A time-out was performed confirming the patient, procedure and allergy status  Speculum placed in vagina and excellent visualization of cervix achieved, cervix swabbed x 3 with acetic acid solution.  Findings: Cervix: no visible lesions, no mosaicism, no punctation and no abnormal vasculature; SCJ visualized 360 degrees without lesions, endocervical curettage performed, cervical biopsies taken at 6 and 12 o'clock, specimen labelled and sent to pathology and hemostasis achieved with silver nitrate.   Vaginal inspection: normal without visible lesions. Vulvar colposcopy: vulvar colposcopy not performed.   Physical Exam   Specimens: ECC and cervical biopsies  Complications: none.  Plan: Specimens labelled and sent to Pathology. Will base further treatment on Pathology findings. Treatment options discussed with patient. Post biopsy instructions given to patient. Return to discuss Pathology results in 2 weeks.

## 2016-05-07 NOTE — Patient Instructions (Addendum)
Cervical Biopsy °A cervical biopsy is a procedure to remove a small sample of tissue from the cervix. The cervix is the lowest part of the womb (uterus), which opens into the vagina (birth canal). °You may have this procedure to check for cancer or growths that may become cancer. This procedure may also be done if the results of your Pap test were abnormal or if your health care provider saw an abnormality during a pelvic exam. °Tell a health care provider about: °· Any allergies you have. °· All medicines you are taking, including vitamins, herbs, eye drops, creams, and over-the-counter medicines. °· Any problems you or family members have had with anesthetic medicines. °· Any blood disorders you have. °· Any surgeries you have had. °· Any medical conditions you have. °· Whether you are pregnant or may be pregnant. °· Whether you are having your menstrual period or will be having your period at the time of the procedure. °What are the risks? °Generally, this is a safe procedure. However, problems may occur, including: °· Infection. °· Bleeding. °· Allergic reactions to medicines or dyes. °· Damage to other structures or organs. °What happens before the procedure? °· Do not douche, have sex, use tampons, or use any vaginal medicines before the procedure as told by your health care provider. °· Follow instructions from your health care provider about eating or drinking restrictions. °· Ask your health care provider about: °¨ Changing or stopping your regular medicines. This is especially important if you are taking diabetes medicines or blood thinners. °¨ Taking medicines such as aspirin and ibuprofen. These medicines can thin your blood. Do not take these medicines before your procedure if your health care provider instructs you not to. °· You may be given an over-the-counter pain medicine to take right before the procedure. °· You may be asked to empty your bladder and bowel right before the procedure. °What  happens during the procedure? °· You will undress from the waist down. °· You will lie on an examining table and put your feet in stirrups. °· To reduce your risk of infection: °¨ Your health care team will wash or sanitize their hands. °¨ Your skin will be washed with soap. °· Your health care provider will use a lubricated instrument (speculum) to open your vagina. An instrument that has a magnifying lens and a light (colposcope) will let your health care provider examine the cervix more closely. °· You may be given a medicine to numb the area (local anesthetic). °· Your health care provider will apply a solution to your cervix. This turns abnormal areas a pale color. °· Your health care provider will use an instrument (biopsy forceps) to take one or more small pieces of tissue that appear abnormal. °· If there seems to be an abnormal area in the part of your cervix that leads to the uterus (endocervical canal), your health care provider will use an instrument (curette) to scrape tissue from that area. This is called endocervical curettage. °· Your health care provider will apply a paste over the biopsy areas to help control bleeding. °The procedure may vary among health care providers and hospitals. °What happens after the procedure? °It is your responsibility to get the results of your procedure. Ask your health care provider or the department performing the procedure when your results will be ready. °This information is not intended to replace advice given to you by your health care provider. Make sure you discuss any questions you have with your health   care provider. Document Released: 05/12/2005 Document Revised: 09/20/2015 Document Reviewed: 09/27/2014 Elsevier Interactive Patient Education  2017 [employer]  Colposcopy, Care After This sheet gives you information about how to care for yourself after your procedure. Your health care provider may also give you more specific instructions. If you have  problems or questions, contact your health care provider. What can I expect after the procedure? If you had a colposcopy without a biopsy, you can expect to feel fine right away, but you may have some spotting for a few days. You can go back to your normal activities. If you had a colposcopy with a biopsy, it is common to have:  Soreness and pain. This may last for a few days.  Light-headedness.  Mild vaginal bleeding or dark-colored, grainy discharge. This may last for a few days. The discharge may be due to a solution that was used during the procedure. You may need to wear a sanitary pad during this time.  Spotting for at least 48 hours after the procedure. Follow these instructions at home:  Take over-the-counter and prescription medicines only as told by your health care provider. Talk with your health care provider about what type of over-the-counter pain medicine and prescription medicine you can start taking again. It is especially important to talk with your health care provider if you take blood-thinning medicine.  Do not drive or use heavy machinery while taking prescription pain medicine.  For at least 3 days after your procedure, or as long as told by your health care provider, avoid:  Douching.  Using tampons.  Having sexual intercourse.  Continue to use birth control (contraception).  Limit your physical activity for the first day after the procedure as told by your health care provider. Ask your health care provider what activities are safe for you.  It is up to you to get the results of your procedure. Ask your health care provider, or the department performing the procedure, when your results will be ready.  Keep all follow-up visits as told by your health care provider. This is important. Contact a health care provider if:  You develop a skin rash. Get help right away if:  You are bleeding heavily from your vagina or you are passing blood clots. This includes  using more than one sanitary pad per hour for 2 hours in a row.  You have a fever or chills.  You have pelvic pain.  You have abnormal, yellow-colored, or bad-smelling vaginal discharge. This could be a sign of infection.  You have severe pain or cramps in your lower abdomen that do not get better with medicine.  You feel light-headed or dizzy, or you faint. Summary  If you had a colposcopy without a biopsy, you can expect to feel fine right away, but you may have some spotting for a few days. You can go back to your normal activities.  If you had a colposcopy with a biopsy, you may notice mild pain and spotting for 48 hours after the procedure.  Avoid douching, using tampons, and having sexual intercourse for 3 days after the procedure or as long as told by your health care provider.  Contact your health care provider if you have bleeding, severe pain, or signs of infection. This information is not intended to replace advice given to you by your health care provider. Make sure you discuss any questions you have with your health care provider. Document Released: 03/02/2013 Document Revised: 12/28/2015 Document Reviewed: 12/28/2015 Elsevier Interactive Patient  Education  2017 [employer] Inc[employer]  

## 2016-05-11 ENCOUNTER — Other Ambulatory Visit: Payer: Self-pay | Admitting: Obstetrics

## 2016-05-21 NOTE — Progress Notes (Signed)
Pt has appt scheduled for f/u results. Will discuss at that time.

## 2016-05-27 ENCOUNTER — Ambulatory Visit: Payer: Medicaid Other

## 2016-05-29 ENCOUNTER — Ambulatory Visit (INDEPENDENT_AMBULATORY_CARE_PROVIDER_SITE_OTHER): Payer: Medicaid Other | Admitting: Obstetrics

## 2016-05-29 ENCOUNTER — Encounter: Payer: Self-pay | Admitting: Obstetrics

## 2016-05-29 VITALS — BP 117/80 | HR 99 | Ht 62.0 in | Wt 229.0 lb

## 2016-05-29 DIAGNOSIS — Z30013 Encounter for initial prescription of injectable contraceptive: Secondary | ICD-10-CM

## 2016-05-29 DIAGNOSIS — R87612 Low grade squamous intraepithelial lesion on cytologic smear of cervix (LGSIL): Secondary | ICD-10-CM

## 2016-05-29 NOTE — Progress Notes (Signed)
Pt present to discuss Colposcopy bx results. LGSIL, CIN1, mild dysplasia. Depo given today R buttock w/o difficulty. Next depo due March 28th.

## 2016-08-21 ENCOUNTER — Ambulatory Visit: Payer: Medicaid Other

## 2016-09-09 ENCOUNTER — Ambulatory Visit (INDEPENDENT_AMBULATORY_CARE_PROVIDER_SITE_OTHER): Payer: Medicaid Other | Admitting: Obstetrics

## 2016-09-09 VITALS — BP 151/86 | HR 82 | Wt 242.0 lb

## 2016-09-09 DIAGNOSIS — Z30011 Encounter for initial prescription of contraceptive pills: Secondary | ICD-10-CM

## 2016-09-09 DIAGNOSIS — Z3009 Encounter for other general counseling and advice on contraception: Secondary | ICD-10-CM

## 2016-09-09 MED ORDER — NORETHIN-ETH ESTRAD-FE BIPHAS 1 MG-10 MCG / 10 MCG PO TABS: 1.0000 | ORAL_TABLET | Freq: Every day | ORAL | 11 refills | Status: DC

## 2016-09-09 NOTE — Progress Notes (Signed)
Subjective:    Monica [name] is a 21 y.o. female who presents for contraception counseling. The patient has no complaints today. The patient is sexually active. Pertinent past medical history: none.  The information documented in the HPI was reviewed and verified.  Menstrual History: OB History    Gravida Para Term Preterm AB Living   SAB TAB Ectopic Multiple Live Births           1      No LMP recorded. Patient is not currently having periods (Reason: Other).   There are no active problems to display for this patient.  No past medical history on file.  Past Surgical History:  Procedure Laterality Date  . arthoscopic knee Right 2013  . CESAREAN SECTION N/A 11/05/2012   Procedure: CESAREAN SECTION;  Surgeon: Antionette Char, MD;  Location: [facility];  Service: Obstetrics;  Laterality: N/A;  . COLPOSCOPY  05/07/2016  . knee srthoscopy N/A      Current Outpatient Prescriptions:  .  Norethin-Eth Estrad-Fe Biphas (LO LOESTRIN FE PO), Take by mouth., Disp: , Rfl:  .  cetirizine (ZYRTEC) 10 MG tablet, Take 10 mg by mouth daily., Disp: , Rfl:  .  naproxen (NAPROSYN) 500 MG tablet, Take 1 tablet (500 mg total) by mouth 2 (two) times daily as needed. (Patient not taking: Reported on 05/29/2016), Disp: 30 tablet, Rfl: 0 .  Norethindrone-Ethinyl Estradiol-Fe Biphas (LO LOESTRIN FE) 1 MG-10 MCG / 10 MCG tablet, Take 1 tablet by mouth daily., Disp: 1 Package, Rfl: 11  Current Facility-Administered Medications:  .  medroxyPROGESTERone (DEPO-PROVERA) injection 150 mg, 150 mg, Intramuscular, Q90 days, Brock Bad, MD, 150 mg at 05/29/16 1425 Allergies  Allergen Reactions  . Iodine   . Shellfish Allergy Hives and Rash    Social History  Substance Use Topics  . Smoking status: Never Smoker  . Smokeless tobacco: Never Used  . Alcohol use 0.0 oz/week     Comment: sometimes     Family History  Problem Relation Age of Onset  . Heart attack Maternal Grandmother   .  Hypertension Maternal Grandmother   . Healthy Brother     x1  . Healthy Sister     x1`  . Cancer Neg Hx   . Diabetes Neg Hx        Review of Systems Constitutional: positive for weight gain on Depo Provera Genitourinary:negative for abnormal menstrual periods and vaginal discharge   Objective:   BP (!) 151/86   Pulse 82   Wt 242 lb (109.8 kg)   BMI 44.26 kg/m    PE:  Deferred   Lab Review Urine pregnancy test Labs reviewed yes Radiologic studies reviewed no  >50% of 10 min visit spent on counseling and coordination of care.    Assessment:    21 y.o., discontinuing Depo-Provera injections and starting OCP's, no contraindications.   Plan:   Lo Loestrin Fe 24 Rx  All questions answered. Contraception: OCP (estrogen/progesterone). Discussed healthy lifestyle modifications. Follow up in 7 months.    Meds ordered this encounter  Medications  . Norethin-Eth Estrad-Fe Biphas (LO LOESTRIN FE PO)    Sig: Take by mouth.  . Norethindrone-Ethinyl Estradiol-Fe Biphas (LO LOESTRIN FE) 1 MG-10 MCG / 10 MCG tablet    Sig: Take 1 tablet by mouth daily.    Dispense:  1 Package    Refill:  11   No orders of the defined types were placed  in this encounter.

## 2016-09-09 NOTE — Progress Notes (Signed)
Pt states that she would like long term BC. Pt had had problems with all birth control in past.

## 2016-09-15 ENCOUNTER — Emergency Department (HOSPITAL_COMMUNITY): Payer: Medicaid Other

## 2016-09-15 ENCOUNTER — Emergency Department (HOSPITAL_COMMUNITY)
Admission: EM | Admit: 2016-09-15 | Discharge: 2016-09-15 | Disposition: A | Discharge status: Home / Self Care | Payer: Medicaid Other | Attending: Emergency Medicine | Admitting: Emergency Medicine

## 2016-09-15 ENCOUNTER — Encounter (HOSPITAL_COMMUNITY): Payer: Self-pay

## 2016-09-15 DIAGNOSIS — S63502A Unspecified sprain of left wrist, initial encounter: Secondary | ICD-10-CM | POA: Diagnosis not present

## 2016-09-15 DIAGNOSIS — Y9389 Activity, other specified: Secondary | ICD-10-CM | POA: Diagnosis not present

## 2016-09-15 DIAGNOSIS — Y999 Unspecified external cause status: Secondary | ICD-10-CM | POA: Diagnosis not present

## 2016-09-15 DIAGNOSIS — S20212A Contusion of left front wall of thorax, initial encounter: Secondary | ICD-10-CM | POA: Diagnosis not present

## 2016-09-15 DIAGNOSIS — Y92481 Parking lot as the place of occurrence of the external cause: Secondary | ICD-10-CM | POA: Diagnosis not present

## 2016-09-15 DIAGNOSIS — S6992XA Unspecified injury of left wrist, hand and finger(s), initial encounter: Secondary | ICD-10-CM | POA: Diagnosis present

## 2016-09-15 MED ORDER — METHOCARBAMOL 500 MG PO TABS
500.0000 mg | ORAL_TABLET | Freq: Two times a day (BID) | ORAL | 0 refills | Status: DC
Start: 2016-09-15 — End: 2016-09-18

## 2016-09-15 MED ORDER — DICLOFENAC SODIUM 75 MG PO TBEC: 75.0000 mg | DELAYED_RELEASE_TABLET | Freq: Two times a day (BID) | ORAL | 0 refills | Status: DC
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
## 2016-09-15 NOTE — Discharge Instructions (Signed)
Return if any problems.  Ice to area of swelling.  See your Physician for recheck in 1 week if pain persist
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
## 2016-09-18 ENCOUNTER — Encounter (HOSPITAL_COMMUNITY): Payer: Self-pay | Admitting: Emergency Medicine

## 2016-09-18 ENCOUNTER — Ambulatory Visit (HOSPITAL_COMMUNITY)
Admission: EM | Admit: 2016-09-18 | Discharge: 2016-09-18 | Disposition: A | Discharge status: Home / Self Care | Payer: Medicaid Other | Attending: Family Medicine | Admitting: Family Medicine

## 2016-09-18 DIAGNOSIS — S63502A Unspecified sprain of left wrist, initial encounter: Secondary | ICD-10-CM | POA: Diagnosis not present

## 2016-09-18 DIAGNOSIS — S40012A Contusion of left shoulder, initial encounter: Secondary | ICD-10-CM | POA: Diagnosis not present

## 2016-09-18 MED ORDER — PREDNISONE 20 MG PO TABS: ORAL_TABLET | ORAL | 0 refills | Status: DC

## 2016-09-18 MED ORDER — HYDROCODONE-ACETAMINOPHEN 5-325 MG PO TABS: 1.0000 | ORAL_TABLET | Freq: Every evening | ORAL | 0 refills | Status: DC | PRN
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
## 2016-09-18 NOTE — Discharge Instructions (Signed)
Return if pain is not subsiding by Sunday

## 2017-06-29 ENCOUNTER — Ambulatory Visit: Payer: Self-pay | Admitting: Obstetrics and Gynecology

## 2017-07-17 ENCOUNTER — Ambulatory Visit: Payer: Medicaid Other | Admitting: Medical

## 2017-09-01 ENCOUNTER — Ambulatory Visit (INDEPENDENT_AMBULATORY_CARE_PROVIDER_SITE_OTHER): Payer: Medicaid Other

## 2017-09-01 DIAGNOSIS — N912 Amenorrhea, unspecified: Secondary | ICD-10-CM | POA: Diagnosis not present

## 2017-09-01 DIAGNOSIS — Z3201 Encounter for pregnancy test, result positive: Secondary | ICD-10-CM | POA: Diagnosis not present

## 2017-09-01 LAB — POCT URINE PREGNANCY: Preg Test, Ur: POSITIVE — AB

## 2017-09-01 NOTE — Progress Notes (Signed)
Monica [name] presents today for UPT. She has no unusual complaints. LMP:07/03/2017    OBJECTIVE: Appears well, in no apparent distress.  OB History    Gravida  1   Para  1   Term  1   Preterm      AB      Living  1     SAB      TAB      Ectopic      Multiple      Live Births  1          Home UPT Result:Positive took 3 at home test  In-Office UPT result:Positive I have reviewed the patient's medical, obstetrical, social, and family histories, and medications.   ASSESSMENT: Positive pregnancy test  PLAN [facility]

## 2017-09-14 DIAGNOSIS — O0990 Supervision of high risk pregnancy, unspecified, unspecified trimester: Secondary | ICD-10-CM | POA: Insufficient documentation

## 2017-09-15 ENCOUNTER — Other Ambulatory Visit (HOSPITAL_COMMUNITY)
Admission: RE | Admit: 2017-09-15 | Discharge: 2017-09-15 | Disposition: A | Discharge status: Home / Self Care | Payer: Medicaid Other | Source: Ambulatory Visit | Attending: Certified Nurse Midwife | Admitting: Certified Nurse Midwife

## 2017-09-15 ENCOUNTER — Other Ambulatory Visit: Payer: Self-pay

## 2017-09-15 ENCOUNTER — Ambulatory Visit (INDEPENDENT_AMBULATORY_CARE_PROVIDER_SITE_OTHER): Payer: Medicaid Other | Admitting: Certified Nurse Midwife

## 2017-09-15 ENCOUNTER — Encounter: Payer: Self-pay | Admitting: Certified Nurse Midwife

## 2017-09-15 VITALS — BP 133/88 | HR 92 | Temp 97.8°F | Wt 237.9 lb

## 2017-09-15 DIAGNOSIS — O10919 Unspecified pre-existing hypertension complicating pregnancy, unspecified trimester: Secondary | ICD-10-CM

## 2017-09-15 DIAGNOSIS — O0990 Supervision of high risk pregnancy, unspecified, unspecified trimester: Secondary | ICD-10-CM | POA: Diagnosis not present

## 2017-09-15 DIAGNOSIS — O99211 Obesity complicating pregnancy, first trimester: Secondary | ICD-10-CM

## 2017-09-15 DIAGNOSIS — O99210 Obesity complicating pregnancy, unspecified trimester: Secondary | ICD-10-CM | POA: Insufficient documentation

## 2017-09-15 DIAGNOSIS — O34219 Maternal care for unspecified type scar from previous cesarean delivery: Secondary | ICD-10-CM

## 2017-09-15 DIAGNOSIS — O0991 Supervision of high risk pregnancy, unspecified, first trimester: Secondary | ICD-10-CM

## 2017-09-15 DIAGNOSIS — O219 Vomiting of pregnancy, unspecified: Secondary | ICD-10-CM

## 2017-09-15 DIAGNOSIS — Z3A10 10 weeks gestation of pregnancy: Secondary | ICD-10-CM | POA: Diagnosis not present

## 2017-09-15 DIAGNOSIS — I10 Essential (primary) hypertension: Secondary | ICD-10-CM | POA: Insufficient documentation

## 2017-09-15 DIAGNOSIS — Z98891 History of uterine scar from previous surgery: Secondary | ICD-10-CM | POA: Insufficient documentation

## 2017-09-15 DIAGNOSIS — E669 Obesity, unspecified: Secondary | ICD-10-CM

## 2017-09-15 HISTORY — DX: Unspecified pre-existing hypertension complicating pregnancy, unspecified trimester: O10.919

## 2017-09-15 MED ORDER — DOXYLAMINE-PYRIDOXINE 10-10 MG PO TBEC: DELAYED_RELEASE_TABLET | ORAL | 4 refills | Status: DC

## 2017-09-15 MED ORDER — PROMETHAZINE HCL 12.5 MG PO TABS: 12.5000 mg | ORAL_TABLET | Freq: Four times a day (QID) | ORAL | 0 refills | Status: DC | PRN

## 2017-09-15 MED ORDER — ASPIRIN 81 MG PO CHEW: 81.0000 mg | CHEWABLE_TABLET | Freq: Every day | ORAL | 12 refills | Status: DC

## 2017-09-15 MED ORDER — PROVIDA OB 20-20-1.25 MG PO CAPS
1.0000 | ORAL_CAPSULE | Freq: Every day | ORAL | 12 refills | Status: DC
Start: 2017-09-15 — End: 2020-02-09

## 2017-09-15 NOTE — Progress Notes (Signed)
NOB 

## 2017-09-15 NOTE — Progress Notes (Signed)
Subjective:   Monica [name] is a 22 y.o. G2P1001 at 61w4dby LMP being seen today for her first obstetrical visit.  Her obstetrical history is significant for obesity, pregnancy induced hypertension and chronic hypertension, previous C-section X1: desires TOLAC. Patient does not intend to breast feed. Pregnancy history fully reviewed.  Employed as a nEngineer, civil (consulting)   Patient reports nausea, no bleeding, no contractions, no cramping and no leaking.  HISTORY: OB History  Gravida Para Term Preterm AB Living  _0 0 0 1  SAB TAB Ectopic Multiple Live Births  0 0 0 0 1    # Outcome Date GA Lbr Len/2nd Weight Sex Delivery Anes PTL Lv  2 Current           1 Term 11/05/12 381w0d7 lb 15.5 oz (3.615 kg) F CS-LTranv Spinal  LIV     Name: Frazee,GIRL Alithea     Apgar1: 9  Apgar5: 9    Last pap smear was done 03/05/16 and was abnormal - ASCUS +HPV  Past Medical History:  Diagnosis Date  . Anxiety   . Depression   . Headache   . Hypertension   . Pregnancy induced hypertension   . Vaginal Pap smear, abnormal    Past Surgical History:  Procedure Laterality Date  . arthoscopic knee Right 2013  . CESAREAN SECTION N/A 11/05/2012   Procedure: CESAREAN SECTION;  Surgeon: LiLahoma CrockerMD;  [facility];  Service: Obstetrics;  Laterality: N/A;  . COLPOSCOPY  05/07/2016  . knee srthoscopy N/A    Family History  Problem Relation Age of Onset  . Heart attack Maternal Grandmother   . Hypertension Maternal Grandmother   . Hearing loss Maternal Grandmother   . Kidney disease Maternal Grandmother   . Healthy Brother        x1  . Asthma Brother   . Healthy Sister        x1`  . Intellectual disability Maternal Aunt   . Cancer Neg Hx   . Diabetes Neg Hx    Social History   Tobacco Use  . Smoking status: Never Smoker  . Smokeless tobacco: Never Used  Substance Use Topics  . Alcohol use: Yes    Alcohol/week: 0.0 oz    Comment: sometimes   . Drug use: No   Allergies  Allergen  Reactions  . Iodine   . Shellfish Allergy Hives and Rash   Current Outpatient Medications on File Prior to Visit  Medication Sig Dispense Refill  . cetirizine (ZYRTEC) 10 MG tablet Take 10 mg by mouth daily.     No current facility-administered medications on file prior to visit.     Review of Systems Pertinent items noted in HPI and remainder of comprehensive ROS otherwise negative.  Exam   Vitals:   09/15/17 1015 09/15/17 1042  BP: (!) 139/93 133/88  Pulse: 79 92  Temp: 97.8 F (36.6 C)   Weight: 237 lb 14.4 oz (107.9 kg)    Fetal Heart Rate (bpm): 162; [place]terus:     Pelvic Exam: Perineum: no hemorrhoids, normal perineum   Vulva: normal external genitalia, no lesions   Vagina:  normal mucosa, normal discharge   Cervix: no lesions and normal, pap smear done.    Adnexa: normal adnexa and no mass, fullness, tenderness   Bony Pelvis: average  System: General: well-developed, well-nourished female in no acute distress   Breast:  normal appearance, no masses or tenderness  Skin: normal coloration and turgor, no rashes   Neurologic: oriented, normal, negative, normal mood   Extremities: normal strength, tone, and muscle mass, ROM of all joints is normal   HEENT PERRLA, extraocular movement intact and sclera clear, anicteric   Mouth/Teeth mucous membranes moist, pharynx normal without lesions and dental hygiene good   Neck supple and no masses   Cardiovascular: regular rate and rhythm   Respiratory:  no respiratory distress, normal breath sounds   Abdomen: soft, non-tender; bowel sounds normal; no masses,  no organomegaly    Limited US in office for viability: FHR present.  Assessment:   Pregnancy: G2P1001 Patient Active Problem List   Diagnosis Date Noted  . History of C-section 09/15/2017  . Hypertension 09/15/2017  . Obesity in pregnancy 09/15/2017  . Supervision of high risk pregnancy, antepartum 09/14/2017     Plan:  1. Supervision of high risk pregnancy,  antepartum     - Enroll Patient in Babyscripts - Obstetric Panel, Including HIV - Hemoglobinopathy evaluation - Culture, OB Urine - Cervicovaginal ancillary only - Cytology - PAP - Cystic Fibrosis Mutation 97 - Genetic Screening - Prenat w/o A Vit-FeFum-FePo-FA (PROVIDA OB) 20-20-1.25 MG CAPS; Take 1 tablet by mouth daily.  Dispense: 30 capsule; Refill: 12 - [place] MFM OB DETAIL +14 WK; Future  2. History of C-section     TOLAC  3. Essential hypertension      - aspirin 81 MG chewable tablet; Chew 1 tablet (81 mg total) by mouth daily.  Dispense: 30 tablet; Refill: 12 - Comp Met (CMET) - Protein / creatinine ratio, urine - [place] MFM OB DETAIL +14 WK; Future  4. Nausea/vomiting in pregnancy     - Doxylamine-Pyridoxine (DICLEGIS) 10-10 MG TBEC; Take 1 tablet with breakfast and lunch.  Take 2 tablets at bedtime.  Dispense: 100 tablet; Refill: 4 - promethazine (PHENERGAN) 12.5 MG tablet; Take 1 tablet (12.5 mg total) by mouth every 6 (six) hours as needed for nausea or vomiting.  Dispense: 30 tablet; Refill: 0   Initial labs drawn. Continue prenatal vitamins. Genetic Screening discussed, NIPS: requested. Ultrasound discussed; fetal anatomic survey: ordered. Problem list reviewed and updated. The nature of [place] with multiple MDs and other Advanced Practice Providers was explained to patient; also emphasized that residents, students are part of our team. Routine obstetric precautions reviewed. Return in about 1 month (around 10/13/2017) for [facility].     Kandis Cocking, [facility], [place]

## 2017-09-16 ENCOUNTER — Other Ambulatory Visit: Payer: Self-pay | Admitting: Certified Nurse Midwife

## 2017-09-16 DIAGNOSIS — B9689 Other specified bacterial agents as the cause of diseases classified elsewhere: Secondary | ICD-10-CM

## 2017-09-16 DIAGNOSIS — N760 Acute vaginitis: Principal | ICD-10-CM

## 2017-09-16 LAB — CERVICOVAGINAL ANCILLARY ONLY
Bacterial vaginitis: POSITIVE — AB
Candida vaginitis: NEGATIVE
Chlamydia: NEGATIVE
Neisseria Gonorrhea: NEGATIVE
Trichomonas: NEGATIVE

## 2017-09-16 LAB — PROTEIN / CREATININE RATIO, URINE
Creatinine, Urine: 148.3 mg/dL
Protein, Ur: 46.9 mg/dL
Protein/Creat Ratio: 316 mg/g creat — ABNORMAL HIGH (ref 0–200)

## 2017-09-16 MED ORDER — METRONIDAZOLE 0.75 % VA GEL
1.0000 | Freq: Two times a day (BID) | VAGINAL | 0 refills | Status: DC
Start: 2017-09-16 — End: 2017-10-13

## 2017-09-17 ENCOUNTER — Telehealth: Payer: Self-pay

## 2017-09-17 NOTE — Telephone Encounter (Signed)
Patient notified of results and Rx. 

## 2017-09-17 NOTE — Telephone Encounter (Signed)
-----   Message from Roe Coombsachelle A Denney, CNM sent at 09/16/2017  4:48 PM EDT ----- Please call and let her know that her labs look good.  She does have BV.  Metrogel was sent to the pharmacy for her to use.  Thank you.  R.Denney CNM

## 2017-09-17 NOTE — Telephone Encounter (Signed)
[id number] SOLOSEC  EFFECTIVE 09/17/17/-09/12/18

## 2017-09-17 NOTE — Progress Notes (Signed)
Patient notified

## 2017-09-18 LAB — URINE CULTURE, OB REFLEX

## 2017-09-18 LAB — CULTURE, OB URINE

## 2017-09-21 ENCOUNTER — Other Ambulatory Visit: Payer: Self-pay | Admitting: Certified Nurse Midwife

## 2017-09-21 DIAGNOSIS — O0990 Supervision of high risk pregnancy, unspecified, unspecified trimester: Secondary | ICD-10-CM

## 2017-09-21 LAB — CYTOLOGY - PAP
Adequacy: ABSENT
Diagnosis: NEGATIVE

## 2017-09-22 ENCOUNTER — Other Ambulatory Visit: Payer: Self-pay | Admitting: Certified Nurse Midwife

## 2017-09-22 DIAGNOSIS — O0990 Supervision of high risk pregnancy, unspecified, unspecified trimester: Secondary | ICD-10-CM

## 2017-09-22 LAB — COMPREHENSIVE METABOLIC PANEL
ALT: 9 IU/L (ref 0–32)
AST: 13 IU/L (ref 0–40)
Albumin/Globulin Ratio: 1.3 (ref 1.2–2.2)
Albumin: 3.8 g/dL (ref 3.5–5.5)
Alkaline Phosphatase: 49 IU/L (ref 39–117)
BUN/Creatinine Ratio: 12 (ref 9–23)
BUN: 5 mg/dL — ABNORMAL LOW (ref 6–20)
Bilirubin Total: 0.2 mg/dL (ref 0.0–1.2)
CO2: 21 mmol/L (ref 20–29)
Calcium: 9.5 mg/dL (ref 8.7–10.2)
Chloride: 103 mmol/L (ref 96–106)
Creatinine, Ser: 0.42 mg/dL — ABNORMAL LOW (ref 0.57–1.00)
GFR calc Af Amer: 168 mL/min/{1.73_m2} (ref >59)
GFR calc non Af Amer: 146 mL/min/{1.73_m2} (ref >59)
Globulin, Total: 2.9 g/dL (ref 1.5–4.5)
Glucose: 102 mg/dL — ABNORMAL HIGH (ref 65–99)
Potassium: 4 mmol/L (ref 3.5–5.2)
Sodium: 140 mmol/L (ref 134–144)
Total Protein: 6.7 g/dL (ref 6.0–8.5)

## 2017-09-22 LAB — OBSTETRIC PANEL, INCLUDING HIV
Antibody Screen: NEGATIVE
Basophils Absolute: 0 10*3/uL (ref 0.0–0.2)
Basos: 0 %
EOS (ABSOLUTE): 0.2 10*3/uL (ref 0.0–0.4)
Eos: 2 %
HIV Screen 4th Generation wRfx: NONREACTIVE
Hematocrit: 39.2 % (ref 34.0–46.6)
Hemoglobin: 13 g/dL (ref 11.1–15.9)
Hepatitis B Surface Ag: NEGATIVE
Immature Grans (Abs): 0 10*3/uL (ref 0.0–0.1)
Immature Granulocytes: 0 %
Lymphocytes Absolute: 2.5 10*3/uL (ref 0.7–3.1)
Lymphs: 23 %
MCH: 25.6 pg — ABNORMAL LOW (ref 26.6–33.0)
MCHC: 33.2 g/dL (ref 31.5–35.7)
MCV: 77 fL — ABNORMAL LOW (ref 79–97)
Monocytes Absolute: 0.7 10*3/uL (ref 0.1–0.9)
Monocytes: 7 %
Neutrophils Absolute: 7.4 10*3/uL — ABNORMAL HIGH (ref 1.4–7.0)
Neutrophils: 68 %
Platelets: 322 10*3/uL (ref 150–379)
RBC: 5.08 x10E6/uL (ref 3.77–5.28)
RDW: 14.9 % (ref 12.3–15.4)
RPR Ser Ql: NONREACTIVE
Rh Factor: POSITIVE
Rubella Antibodies, IGG: 3.71 index (ref >0.99)
WBC: 10.8 10*3/uL (ref 3.4–10.8)

## 2017-09-22 LAB — HEMOGLOBINOPATHY EVALUATION
HGB C: 0 %
HGB S: 0 %
HGB VARIANT: 0 %
Hemoglobin A2 Quantitation: 2.2 % (ref 1.8–3.2)
Hemoglobin F Quantitation: 0 % (ref 0.0–2.0)
Hgb A: 97.8 % (ref 96.4–98.8)

## 2017-09-22 LAB — CYSTIC FIBROSIS MUTATION 97: Interpretation: NOT DETECTED

## 2017-09-25 ENCOUNTER — Encounter: Payer: Self-pay | Admitting: *Deleted

## 2017-10-13 ENCOUNTER — Ambulatory Visit (INDEPENDENT_AMBULATORY_CARE_PROVIDER_SITE_OTHER): Payer: Medicaid Other | Admitting: Obstetrics and Gynecology

## 2017-10-13 ENCOUNTER — Encounter: Payer: Self-pay | Admitting: Obstetrics and Gynecology

## 2017-10-13 VITALS — BP 116/82 | HR 81 | Wt 236.3 lb

## 2017-10-13 DIAGNOSIS — I10 Essential (primary) hypertension: Secondary | ICD-10-CM

## 2017-10-13 DIAGNOSIS — O34219 Maternal care for unspecified type scar from previous cesarean delivery: Secondary | ICD-10-CM

## 2017-10-13 DIAGNOSIS — O0990 Supervision of high risk pregnancy, unspecified, unspecified trimester: Secondary | ICD-10-CM

## 2017-10-13 DIAGNOSIS — Z98891 History of uterine scar from previous surgery: Secondary | ICD-10-CM

## 2017-10-13 DIAGNOSIS — O0992 Supervision of high risk pregnancy, unspecified, second trimester: Secondary | ICD-10-CM

## 2017-10-13 NOTE — Patient Instructions (Signed)

## 2017-10-13 NOTE — Progress Notes (Signed)
Subjective:  Monica [name] is a 22 y.o. G2P1001 at [redacted]w[redacted]d being seen today for ongoing prenatal care.  She is currently monitored for the following issues for this high-risk pregnancy and has Supervision of high risk pregnancy, antepartum; History of C-section; Hypertension; and Obesity in pregnancy on their problem list.  Patient reports no complaints.  Contractions: Not present. Vag. Bleeding: None.  Movement: Present. Denies leaking of fluid.   The following portions of the patient's history were reviewed and updated as appropriate: allergies, current medications, past family history, past medical history, past social history, past surgical history and problem list. Problem list updated.  Objective:   Vitals:   10/13/17 0833  BP: 116/82  Pulse: 81  Weight: 236 lb 4.8 oz (107.2 kg)    Fetal Status: Fetal Heart Rate (bpm): 155   Movement: Present     General:  Alert, oriented and cooperative. Patient is in no acute distress.  Skin: Skin is warm and dry. No rash noted.   Cardiovascular: Normal heart rate noted  Respiratory: Normal respiratory effort, no problems with respiration noted  Abdomen: Soft, gravid, appropriate for gestational age. Pain/Pressure: Present     Pelvic:  Cervical exam deferred        Extremities: Normal range of motion.  Edema: Trace  Mental Status: Normal mood and affect. Normal behavior. Normal judgment and thought content.   Urinalysis:      Assessment and Plan:  Pregnancy: G2P1001 at [redacted]w[redacted]d  1. Supervision of high risk pregnancy, antepartum Stable Anatomy scan ordered Genetic testing today  2. Essential hypertension BP stable without meds Continue with BASA - Hemoglobin A1c  3. History of C-section Desires TOLAC Will discuss at later visits  Preterm labor symptoms and general obstetric precautions including but not limited to vaginal bleeding, contractions, leaking of fluid and fetal movement were reviewed in detail with the patient. Please refer  to After Visit Summary for other counseling recommendations.  Return in about 1 month (around 11/10/2017) for OB visit.   Hermina Staggers, MD

## 2017-10-14 LAB — HEMOGLOBIN A1C
Est. average glucose Bld gHb Est-mCnc: 105 mg/dL
Hgb A1c MFr Bld: 5.3 % (ref 4.8–5.6)

## 2017-10-20 ENCOUNTER — Other Ambulatory Visit: Payer: Self-pay | Admitting: Certified Nurse Midwife

## 2017-10-20 DIAGNOSIS — O0990 Supervision of high risk pregnancy, unspecified, unspecified trimester: Secondary | ICD-10-CM

## 2017-11-02 ENCOUNTER — Encounter (HOSPITAL_COMMUNITY): Payer: Self-pay

## 2017-11-10 ENCOUNTER — Ambulatory Visit (HOSPITAL_COMMUNITY)
Admission: RE | Admit: 2017-11-10 | Discharge: 2017-11-10 | Disposition: A | Discharge status: Home / Self Care | Payer: Medicaid Other | Source: Ambulatory Visit | Attending: Certified Nurse Midwife | Admitting: Certified Nurse Midwife

## 2017-11-10 ENCOUNTER — Other Ambulatory Visit: Payer: Self-pay | Admitting: Certified Nurse Midwife

## 2017-11-10 ENCOUNTER — Ambulatory Visit (INDEPENDENT_AMBULATORY_CARE_PROVIDER_SITE_OTHER): Payer: Medicaid Other | Admitting: Obstetrics and Gynecology

## 2017-11-10 ENCOUNTER — Encounter: Payer: Self-pay | Admitting: Obstetrics and Gynecology

## 2017-11-10 ENCOUNTER — Other Ambulatory Visit (HOSPITAL_COMMUNITY): Payer: Self-pay | Admitting: *Deleted

## 2017-11-10 ENCOUNTER — Encounter (HOSPITAL_COMMUNITY): Payer: Self-pay

## 2017-11-10 VITALS — BP 118/80 | HR 88 | Wt 235.8 lb

## 2017-11-10 DIAGNOSIS — O10012 Pre-existing essential hypertension complicating pregnancy, second trimester: Secondary | ICD-10-CM | POA: Insufficient documentation

## 2017-11-10 DIAGNOSIS — O0990 Supervision of high risk pregnancy, unspecified, unspecified trimester: Secondary | ICD-10-CM

## 2017-11-10 DIAGNOSIS — I10 Essential (primary) hypertension: Secondary | ICD-10-CM

## 2017-11-10 DIAGNOSIS — O0992 Supervision of high risk pregnancy, unspecified, second trimester: Secondary | ICD-10-CM

## 2017-11-10 DIAGNOSIS — Z3A18 18 weeks gestation of pregnancy: Secondary | ICD-10-CM | POA: Insufficient documentation

## 2017-11-10 DIAGNOSIS — O09299 Supervision of pregnancy with other poor reproductive or obstetric history, unspecified trimester: Secondary | ICD-10-CM | POA: Insufficient documentation

## 2017-11-10 DIAGNOSIS — Z363 Encounter for antenatal screening for malformations: Secondary | ICD-10-CM | POA: Diagnosis present

## 2017-11-10 DIAGNOSIS — O99212 Obesity complicating pregnancy, second trimester: Secondary | ICD-10-CM | POA: Diagnosis not present

## 2017-11-10 DIAGNOSIS — O34219 Maternal care for unspecified type scar from previous cesarean delivery: Secondary | ICD-10-CM | POA: Insufficient documentation

## 2017-11-10 DIAGNOSIS — O10912 Unspecified pre-existing hypertension complicating pregnancy, second trimester: Secondary | ICD-10-CM

## 2017-11-10 DIAGNOSIS — O09292 Supervision of pregnancy with other poor reproductive or obstetric history, second trimester: Secondary | ICD-10-CM

## 2017-11-10 DIAGNOSIS — Z98891 History of uterine scar from previous surgery: Secondary | ICD-10-CM

## 2017-11-10 HISTORY — DX: Supervision of pregnancy with other poor reproductive or obstetric history, unspecified trimester: O09.299

## 2017-11-10 NOTE — Progress Notes (Signed)
   PRENATAL VISIT NOTE  Subjective:  Monica [name] is a 22 y.o. G2P1001 at 6129w4d being seen today for ongoing prenatal care.  She is currently monitored for the following issues for this high-risk pregnancy and has Supervision of high risk pregnancy, antepartum; History of C-section; Hypertension; Obesity in pregnancy; and H/O pre-eclampsia in prior pregnancy, currently pregnant on their problem list.  Patient reports no complaints.  Contractions: Not present. Vag. Bleeding: None.  Movement: Present. Denies leaking of fluid.   The following portions of the patient's history were reviewed and updated as appropriate: allergies, current medications, past family history, past medical history, past social history, past surgical history and problem list. Problem list updated.  Objective:   Vitals:   11/10/17 0953  BP: 118/80  Pulse: 88  Weight: 235 lb 12.8 oz (107 kg)    Fetal Status: Fetal Heart Rate (bpm): 148   Movement: Present     General:  Alert, oriented and cooperative. Patient is in no acute distress.  Skin: Skin is warm and dry. No rash noted.   Cardiovascular: Normal heart rate noted  Respiratory: Normal respiratory effort, no problems with respiration noted  Abdomen: Soft, gravid, appropriate for gestational age.  Pain/Pressure: Present     Pelvic: Cervical exam deferred        Extremities: Normal range of motion.  Edema: None  Mental Status: Normal mood and affect. Normal behavior. Normal judgment and thought content.   Assessment and Plan:  Pregnancy: G2P1001 at 5429w4d  1. Supervision of high risk pregnancy, antepartum - AFP, Serum, Open Spina Bifida - anatomy normal  2. History of C-section For TOLAC  3. Essential hypertension Cont baby ASA Stable BP no meds   Preterm labor symptoms and general obstetric precautions including but not limited to vaginal bleeding, contractions, leaking of fluid and fetal movement were reviewed in detail with the patient. Please  refer to After Visit Summary for other counseling recommendations.  Return in about 1 month (around 12/08/2017) for OB visit.  Future Appointments  Date Time Provider Department Center  12/08/2017  7:45 AM [facility] US 2 [facility]    Conan BowensKelly M Davis, MD

## 2017-11-13 ENCOUNTER — Other Ambulatory Visit: Payer: Self-pay | Admitting: Certified Nurse Midwife

## 2017-11-13 DIAGNOSIS — O0990 Supervision of high risk pregnancy, unspecified, unspecified trimester: Secondary | ICD-10-CM

## 2017-11-16 ENCOUNTER — Other Ambulatory Visit (HOSPITAL_COMMUNITY): Payer: Medicaid Other

## 2017-11-18 LAB — AFP, SERUM, OPEN SPINA BIFIDA
AFP MoM: 1.65
AFP Value: 57.5 ng/mL
Gest. Age on Collection Date: 18.6 weeks
Maternal Age At EDD: 23.3 yr
OSBR Risk 1 IN: 1848
Test Results:: NEGATIVE
Weight: 235 [lb_av]

## 2017-12-08 ENCOUNTER — Encounter (HOSPITAL_COMMUNITY): Payer: Self-pay

## 2017-12-08 ENCOUNTER — Ambulatory Visit (HOSPITAL_COMMUNITY)
Admission: RE | Admit: 2017-12-08 | Discharge: 2017-12-08 | Disposition: A | Discharge status: Home / Self Care | Payer: Medicaid Other | Source: Ambulatory Visit | Attending: Certified Nurse Midwife | Admitting: Certified Nurse Midwife

## 2017-12-08 ENCOUNTER — Encounter: Payer: Self-pay | Admitting: Obstetrics and Gynecology

## 2017-12-08 ENCOUNTER — Other Ambulatory Visit (HOSPITAL_COMMUNITY): Payer: Self-pay | Admitting: *Deleted

## 2017-12-08 ENCOUNTER — Other Ambulatory Visit (HOSPITAL_COMMUNITY): Payer: Self-pay | Admitting: Obstetrics and Gynecology

## 2017-12-08 ENCOUNTER — Ambulatory Visit (INDEPENDENT_AMBULATORY_CARE_PROVIDER_SITE_OTHER): Payer: Medicaid Other | Admitting: Obstetrics and Gynecology

## 2017-12-08 VITALS — BP 113/73 | HR 81 | Wt 240.0 lb

## 2017-12-08 DIAGNOSIS — O09299 Supervision of pregnancy with other poor reproductive or obstetric history, unspecified trimester: Secondary | ICD-10-CM

## 2017-12-08 DIAGNOSIS — O10919 Unspecified pre-existing hypertension complicating pregnancy, unspecified trimester: Secondary | ICD-10-CM | POA: Insufficient documentation

## 2017-12-08 DIAGNOSIS — IMO0002 Reserved for concepts with insufficient information to code with codable children: Secondary | ICD-10-CM

## 2017-12-08 DIAGNOSIS — O10912 Unspecified pre-existing hypertension complicating pregnancy, second trimester: Secondary | ICD-10-CM | POA: Diagnosis not present

## 2017-12-08 DIAGNOSIS — O99212 Obesity complicating pregnancy, second trimester: Secondary | ICD-10-CM | POA: Insufficient documentation

## 2017-12-08 DIAGNOSIS — O0990 Supervision of high risk pregnancy, unspecified, unspecified trimester: Secondary | ICD-10-CM

## 2017-12-08 DIAGNOSIS — Z3A22 22 weeks gestation of pregnancy: Secondary | ICD-10-CM | POA: Diagnosis not present

## 2017-12-08 DIAGNOSIS — Z0489 Encounter for examination and observation for other specified reasons: Secondary | ICD-10-CM

## 2017-12-08 DIAGNOSIS — O0992 Supervision of high risk pregnancy, unspecified, second trimester: Secondary | ICD-10-CM

## 2017-12-08 DIAGNOSIS — O34219 Maternal care for unspecified type scar from previous cesarean delivery: Secondary | ICD-10-CM | POA: Insufficient documentation

## 2017-12-08 DIAGNOSIS — O09292 Supervision of pregnancy with other poor reproductive or obstetric history, second trimester: Secondary | ICD-10-CM

## 2017-12-08 HISTORY — DX: Unspecified pre-existing hypertension complicating pregnancy, unspecified trimester: O10.919

## 2017-12-08 NOTE — Progress Notes (Signed)
Subjective:  Monica [name] is a 22 y.o. G2P1001 at 865w4d being seen today for ongoing prenatal care.  She is currently monitored for the following issues for this high-risk pregnancy and has Supervision of high risk pregnancy, antepartum; History of C-section; Hypertension; Obesity in pregnancy; H/O pre-eclampsia in prior pregnancy, currently pregnant; and Chronic hypertension affecting pregnancy on their problem list.  Patient reports no complaints.  Contractions: Not present. Vag. Bleeding: None.  Movement: Present. Denies leaking of fluid.   The following portions of the patient's history were reviewed and updated as appropriate: allergies, current medications, past family history, past medical history, past social history, past surgical history and problem list. Problem list updated.  Objective:   Vitals:   12/08/17 0909  BP: 113/73  Pulse: 81  Weight: 108.9 kg (240 lb)    Fetal Status: Fetal Heart Rate (bpm): 147   Movement: Present     General:  Alert, oriented and cooperative. Patient is in no acute distress.  Skin: Skin is warm and dry. No rash noted.   Cardiovascular: Normal heart rate noted  Respiratory: Normal respiratory effort, no problems with respiration noted  Abdomen: Soft, gravid, appropriate for gestational age. Pain/Pressure: Present     Pelvic:  Cervical exam deferred        Extremities: Normal range of motion.     Mental Status: Normal mood and affect. Normal behavior. Normal judgment and thought content.   Urinalysis:      Assessment and Plan:  Pregnancy: G2P1001 at 5965w4d  1. Supervision of high risk pregnancy, antepartum Stable Glucola next visit  2. H/O pre-eclampsia in prior pregnancy, currently pregnant Stable No S/Sx Continue with BASA  3. Chronic hypertension affecting pregnancy See above  Preterm labor symptoms and general obstetric precautions including but not limited to vaginal bleeding, contractions, leaking of fluid and fetal movement  were reviewed in detail with the patient. Please refer to After Visit Summary for other counseling recommendations.  Return in about 1 month (around 01/05/2018) for OB visit.   Hermina StaggersErvin, Hassani Sliney L, MD

## 2017-12-08 NOTE — Patient Instructions (Signed)

## 2018-01-05 ENCOUNTER — Other Ambulatory Visit (HOSPITAL_COMMUNITY): Payer: Self-pay | Admitting: *Deleted

## 2018-01-05 ENCOUNTER — Ambulatory Visit (HOSPITAL_COMMUNITY)
Admission: RE | Admit: 2018-01-05 | Discharge: 2018-01-05 | Disposition: A | Discharge status: Home / Self Care | Payer: Medicaid Other | Source: Ambulatory Visit | Attending: Obstetrics | Admitting: Obstetrics

## 2018-01-05 DIAGNOSIS — O10012 Pre-existing essential hypertension complicating pregnancy, second trimester: Secondary | ICD-10-CM | POA: Diagnosis not present

## 2018-01-05 DIAGNOSIS — Z362 Encounter for other antenatal screening follow-up: Secondary | ICD-10-CM | POA: Diagnosis not present

## 2018-01-05 DIAGNOSIS — O34219 Maternal care for unspecified type scar from previous cesarean delivery: Secondary | ICD-10-CM | POA: Diagnosis not present

## 2018-01-05 DIAGNOSIS — O09299 Supervision of pregnancy with other poor reproductive or obstetric history, unspecified trimester: Secondary | ICD-10-CM

## 2018-01-05 DIAGNOSIS — O10919 Unspecified pre-existing hypertension complicating pregnancy, unspecified trimester: Secondary | ICD-10-CM | POA: Insufficient documentation

## 2018-01-05 DIAGNOSIS — Z3A26 26 weeks gestation of pregnancy: Secondary | ICD-10-CM | POA: Insufficient documentation

## 2018-01-12 ENCOUNTER — Ambulatory Visit (INDEPENDENT_AMBULATORY_CARE_PROVIDER_SITE_OTHER): Payer: Medicaid Other | Admitting: Family Medicine

## 2018-01-12 ENCOUNTER — Other Ambulatory Visit: Payer: Medicaid Other

## 2018-01-12 ENCOUNTER — Encounter: Payer: Self-pay | Admitting: Family Medicine

## 2018-01-12 VITALS — BP 101/69 | HR 97 | Wt 244.4 lb

## 2018-01-12 DIAGNOSIS — Z23 Encounter for immunization: Secondary | ICD-10-CM | POA: Diagnosis not present

## 2018-01-12 DIAGNOSIS — O0993 Supervision of high risk pregnancy, unspecified, third trimester: Secondary | ICD-10-CM

## 2018-01-12 DIAGNOSIS — O10913 Unspecified pre-existing hypertension complicating pregnancy, third trimester: Secondary | ICD-10-CM

## 2018-01-12 DIAGNOSIS — O0990 Supervision of high risk pregnancy, unspecified, unspecified trimester: Secondary | ICD-10-CM

## 2018-01-12 DIAGNOSIS — O10919 Unspecified pre-existing hypertension complicating pregnancy, unspecified trimester: Secondary | ICD-10-CM

## 2018-01-12 NOTE — Progress Notes (Signed)
    PRENATAL VISIT NOTE  Subjective:  Monica [name] is a 22 y.o. G2P1001 at 4867w4d being seen today for ongoing prenatal care.  She is currently monitored for the following issues for this high-risk pregnancy and has Supervision of high risk pregnancy, antepartum; History of C-section; Hypertension; Obesity in pregnancy; H/O pre-eclampsia in prior pregnancy, currently pregnant; and Chronic hypertension affecting pregnancy on their problem list.  Patient reports no complaints.  Contractions: Not present. Vag. Bleeding: None.  Movement: Present. Denies leaking of fluid.   The following portions of the patient's history were reviewed and updated as appropriate: allergies, current medications, past family history, past medical history, past social history, past surgical history and problem list. Problem list updated.  Objective:   Vitals:   01/12/18 0830  BP: 101/69  Pulse: 97  Weight: 244 lb 6.4 oz (110.9 kg)    Fetal Status: Fetal Heart Rate (bpm): 142 Fundal Height: 27 cm Movement: Present     General:  Alert, oriented and cooperative. Patient is in no acute distress.  Skin: Skin is warm and dry. No rash noted.   Cardiovascular: Normal heart rate noted  Respiratory: Normal respiratory effort, no problems with respiration noted  Abdomen: Soft, gravid, appropriate for gestational age.  Pain/Pressure: Present     Pelvic: Cervical exam deferred        Extremities: Normal range of motion.  Edema: None  Mental Status: Normal mood and affect. Normal behavior. Normal judgment and thought content.   Assessment and Plan:  Pregnancy: G2P1001 at 5267w4d  1. Supervision of high risk pregnancy, antepartum 28 wk labs today + TDaP - Glucose Tolerance, 2 Hours w/1 Hour - CBC - HIV antibody - RPR  2. Chronic hypertension in pregnancy Continue ASA BP is well controlled on no meds Recent growth is WNL 1066 gms (70%)  Preterm labor symptoms and general obstetric precautions including but not  limited to vaginal bleeding, contractions, leaking of fluid and fetal movement were reviewed in detail with the patient. Please refer to After Visit Summary for other counseling recommendations.  Return in 2 weeks (on 01/26/2018).  Future Appointments  Date Time Provider Department Center  02/02/2018  8:30 AM [facility] US 5 [facility]  02/02/2018  9:45 AM Reva BoresPratt, Gedalia Mcmillon S, MD [facility] None    Reva Boresanya S Viren Lebeau, MD

## 2018-01-12 NOTE — Patient Instructions (Signed)

## 2018-01-12 NOTE — Progress Notes (Signed)
Pt presents for ROB and 2 gtt. Tdap given R Del w/o difficulty.

## 2018-01-13 LAB — CBC
Hematocrit: 33.9 % — ABNORMAL LOW (ref 34.0–46.6)
Hemoglobin: 11.1 g/dL (ref 11.1–15.9)
MCH: 25.4 pg — ABNORMAL LOW (ref 26.6–33.0)
MCHC: 32.7 g/dL (ref 31.5–35.7)
MCV: 78 fL — ABNORMAL LOW (ref 79–97)
Platelets: 297 10*3/uL (ref 150–450)
RBC: 4.37 x10E6/uL (ref 3.77–5.28)
RDW: 14.9 % (ref 12.3–15.4)
WBC: 12.2 10*3/uL — ABNORMAL HIGH (ref 3.4–10.8)

## 2018-01-13 LAB — RPR: RPR Ser Ql: NONREACTIVE

## 2018-01-13 LAB — GLUCOSE TOLERANCE, 2 HOURS W/ 1HR
Glucose, 1 hour: 141 mg/dL (ref 65–179)
Glucose, 2 hour: 126 mg/dL (ref 65–152)
Glucose, Fasting: 90 mg/dL (ref 65–91)

## 2018-01-13 LAB — HIV ANTIBODY (ROUTINE TESTING W REFLEX): HIV Screen 4th Generation wRfx: NONREACTIVE

## 2018-02-02 ENCOUNTER — Ambulatory Visit (HOSPITAL_COMMUNITY)
Admission: RE | Admit: 2018-02-02 | Discharge: 2018-02-02 | Disposition: A | Discharge status: Home / Self Care | Payer: Medicaid Other | Source: Ambulatory Visit | Attending: Obstetrics | Admitting: Obstetrics

## 2018-02-02 ENCOUNTER — Other Ambulatory Visit (HOSPITAL_COMMUNITY): Payer: Self-pay | Admitting: *Deleted

## 2018-02-02 ENCOUNTER — Encounter (HOSPITAL_COMMUNITY): Payer: Self-pay

## 2018-02-02 ENCOUNTER — Ambulatory Visit (INDEPENDENT_AMBULATORY_CARE_PROVIDER_SITE_OTHER): Payer: Medicaid Other | Admitting: Family Medicine

## 2018-02-02 VITALS — BP 115/79 | HR 98 | Wt 245.3 lb

## 2018-02-02 DIAGNOSIS — Z3A30 30 weeks gestation of pregnancy: Secondary | ICD-10-CM | POA: Insufficient documentation

## 2018-02-02 DIAGNOSIS — O99213 Obesity complicating pregnancy, third trimester: Secondary | ICD-10-CM | POA: Insufficient documentation

## 2018-02-02 DIAGNOSIS — O10913 Unspecified pre-existing hypertension complicating pregnancy, third trimester: Secondary | ICD-10-CM | POA: Diagnosis not present

## 2018-02-02 DIAGNOSIS — O10013 Pre-existing essential hypertension complicating pregnancy, third trimester: Secondary | ICD-10-CM | POA: Diagnosis not present

## 2018-02-02 DIAGNOSIS — O0993 Supervision of high risk pregnancy, unspecified, third trimester: Secondary | ICD-10-CM

## 2018-02-02 DIAGNOSIS — E669 Obesity, unspecified: Secondary | ICD-10-CM | POA: Diagnosis not present

## 2018-02-02 DIAGNOSIS — O10919 Unspecified pre-existing hypertension complicating pregnancy, unspecified trimester: Secondary | ICD-10-CM

## 2018-02-02 DIAGNOSIS — Z98891 History of uterine scar from previous surgery: Secondary | ICD-10-CM

## 2018-02-02 DIAGNOSIS — O34219 Maternal care for unspecified type scar from previous cesarean delivery: Secondary | ICD-10-CM | POA: Insufficient documentation

## 2018-02-02 DIAGNOSIS — O24419 Gestational diabetes mellitus in pregnancy, unspecified control: Secondary | ICD-10-CM | POA: Diagnosis not present

## 2018-02-02 DIAGNOSIS — O0990 Supervision of high risk pregnancy, unspecified, unspecified trimester: Secondary | ICD-10-CM

## 2018-02-02 DIAGNOSIS — O09293 Supervision of pregnancy with other poor reproductive or obstetric history, third trimester: Secondary | ICD-10-CM | POA: Diagnosis not present

## 2018-02-02 NOTE — Progress Notes (Signed)
Patient reports good fetal movement, denies pain. 

## 2018-02-02 NOTE — Patient Instructions (Signed)

## 2018-02-03 NOTE — Progress Notes (Signed)
   PRENATAL VISIT NOTE  Subjective:  Monica [name] is a 22 y.o. G2P1001 at [redacted]w[redacted]d being seen today for ongoing prenatal care.  She is currently monitored for the following issues for this high-risk pregnancy and has Supervision of high risk pregnancy, antepartum; History of C-section; Hypertension; Obesity in pregnancy; H/O pre-eclampsia in prior pregnancy, currently pregnant; and Chronic hypertension affecting pregnancy on their problem list.  Patient reports no complaints.  Contractions: Not present. Vag. Bleeding: None.  Movement: Present. Denies leaking of fluid.   The following portions of the patient's history were reviewed and updated as appropriate: allergies, current medications, past family history, past medical history, past social history, past surgical history and problem list. Problem list updated.  Objective:   Vitals:   02/02/18 1004  BP: 115/79  Pulse: 98  Weight: 245 lb 4.8 oz (111.3 kg)    Fetal Status: Fetal Heart Rate (bpm): 140 Fundal Height: 32 cm Movement: Present     General:  Alert, oriented and cooperative. Patient is in no acute distress.  Skin: Skin is warm and dry. No rash noted.   Cardiovascular: Normal heart rate noted  Respiratory: Normal respiratory effort, no problems with respiration noted  Abdomen: Soft, gravid, appropriate for gestational age.  Pain/Pressure: Absent     Pelvic: Cervical exam deferred        Extremities: Normal range of motion.  Edema: None  Mental Status: Normal mood and affect. Normal behavior. Normal judgment and thought content.   Assessment and Plan:  Pregnancy: G2P1001 at [redacted]w[redacted]d  1. Supervision of high risk pregnancy, antepartum Continue prenatal care.   2. Chronic hypertension affecting pregnancy BP is well contolled on no meds Continue ASA  3. History of C-section Desires TOLAC--consent signed.  Preterm labor symptoms and general obstetric precautions including but not limited to vaginal bleeding, contractions,  leaking of fluid and fetal movement were reviewed in detail with the patient. Please refer to After Visit Summary for other counseling recommendations.  Return in 2 weeks (on 02/16/2018).  Future Appointments  Date Time Provider Department Center  02/16/2018  9:00 AM Adam Phenix, MD [facility] None  03/02/2018  8:30 AM [facility] [place] 5 [facility]    Reva Bores, MD

## 2018-02-16 ENCOUNTER — Ambulatory Visit (INDEPENDENT_AMBULATORY_CARE_PROVIDER_SITE_OTHER): Payer: Medicaid Other | Admitting: Obstetrics & Gynecology

## 2018-02-16 ENCOUNTER — Encounter: Payer: Self-pay | Admitting: Obstetrics & Gynecology

## 2018-02-16 VITALS — BP 116/75 | HR 96 | Wt 246.0 lb

## 2018-02-16 DIAGNOSIS — Z98891 History of uterine scar from previous surgery: Secondary | ICD-10-CM

## 2018-02-16 DIAGNOSIS — O09293 Supervision of pregnancy with other poor reproductive or obstetric history, third trimester: Secondary | ICD-10-CM

## 2018-02-16 DIAGNOSIS — O0990 Supervision of high risk pregnancy, unspecified, unspecified trimester: Secondary | ICD-10-CM

## 2018-02-16 DIAGNOSIS — O0993 Supervision of high risk pregnancy, unspecified, third trimester: Secondary | ICD-10-CM

## 2018-02-16 DIAGNOSIS — O09299 Supervision of pregnancy with other poor reproductive or obstetric history, unspecified trimester: Secondary | ICD-10-CM

## 2018-02-16 NOTE — Patient Instructions (Signed)

## 2018-02-16 NOTE — Progress Notes (Signed)
ROB.  C/o cramping 5-8/10 x 2 days.

## 2018-02-16 NOTE — Progress Notes (Signed)
   PRENATAL VISIT NOTE  Subjective:  Monica [name] is a 22 y.o. G2P1001 at 1866w4d being seen today for ongoing prenatal care.  She is currently monitored for the following issues for this high-risk pregnancy and has Supervision of high risk pregnancy, antepartum; History of C-section; Hypertension; Obesity in pregnancy; H/O pre-eclampsia in prior pregnancy, currently pregnant; and Chronic hypertension affecting pregnancy on their problem list.  Patient reports no complaints.  Contractions: Irritability. Vag. Bleeding: None.  Movement: Present. Denies leaking of fluid.   The following portions of the patient's history were reviewed and updated as appropriate: allergies, current medications, past family history, past medical history, past social history, past surgical history and problem list. Problem list updated.  Objective:   Vitals:   02/16/18 0859  BP: 116/75  Pulse: 96  Weight: 246 lb (111.6 kg)    Fetal Status:     Movement: Present     General:  Alert, oriented and cooperative. Patient is in no acute distress.  Skin: Skin is warm and dry. No rash noted.   Cardiovascular: Normal heart rate noted  Respiratory: Normal respiratory effort, no problems with respiration noted  Abdomen: Soft, gravid, appropriate for gestational age.  Pain/Pressure: Present     Pelvic: Cervical exam deferred        Extremities: Normal range of motion.  Edema: None  Mental Status: Normal mood and affect. Normal behavior. Normal judgment and thought content.   Assessment and Plan:  Pregnancy: G2P1001 at 866w4d . Supervision of high risk pregnancy, antepartum  2. History of C-section TOLAC  3. H/O pre-eclampsia in prior pregnancy, currently pregnant Normal BP  Preterm labor symptoms and general obstetric precautions including but not limited to vaginal bleeding, contractions, leaking of fluid and fetal movement were reviewed in detail with the patient. Please refer to After Visit Summary for other  counseling recommendations.  Return in about 2 weeks (around 03/02/2018).  Future Appointments  Date Time Provider Department Center  03/02/2018  8:30 AM [facility] US 5 [facility]  03/02/2018 11:15 AM Adam PhenixArnold, Anella Nakata G, MD [facility] None    Scheryl DarterJames Kacin Dancy, MD

## 2018-03-02 ENCOUNTER — Encounter (HOSPITAL_COMMUNITY): Payer: Self-pay

## 2018-03-02 ENCOUNTER — Ambulatory Visit (INDEPENDENT_AMBULATORY_CARE_PROVIDER_SITE_OTHER): Payer: Medicaid Other | Admitting: Obstetrics & Gynecology

## 2018-03-02 ENCOUNTER — Ambulatory Visit (HOSPITAL_COMMUNITY)
Admission: RE | Admit: 2018-03-02 | Discharge: 2018-03-02 | Disposition: A | Discharge status: Home / Self Care | Payer: Medicaid Other | Source: Ambulatory Visit | Attending: Obstetrics | Admitting: Obstetrics

## 2018-03-02 ENCOUNTER — Encounter: Payer: Self-pay | Admitting: Obstetrics & Gynecology

## 2018-03-02 ENCOUNTER — Other Ambulatory Visit (HOSPITAL_COMMUNITY): Payer: Self-pay | Admitting: *Deleted

## 2018-03-02 DIAGNOSIS — Z3A34 34 weeks gestation of pregnancy: Secondary | ICD-10-CM | POA: Diagnosis not present

## 2018-03-02 DIAGNOSIS — O10919 Unspecified pre-existing hypertension complicating pregnancy, unspecified trimester: Secondary | ICD-10-CM

## 2018-03-02 DIAGNOSIS — O99213 Obesity complicating pregnancy, third trimester: Secondary | ICD-10-CM

## 2018-03-02 DIAGNOSIS — O0990 Supervision of high risk pregnancy, unspecified, unspecified trimester: Secondary | ICD-10-CM

## 2018-03-02 DIAGNOSIS — O10013 Pre-existing essential hypertension complicating pregnancy, third trimester: Secondary | ICD-10-CM

## 2018-03-02 DIAGNOSIS — O10913 Unspecified pre-existing hypertension complicating pregnancy, third trimester: Secondary | ICD-10-CM | POA: Insufficient documentation

## 2018-03-02 DIAGNOSIS — O09293 Supervision of pregnancy with other poor reproductive or obstetric history, third trimester: Secondary | ICD-10-CM | POA: Diagnosis not present

## 2018-03-02 NOTE — Progress Notes (Signed)
   PRENATAL VISIT NOTE  Subjective:  Monica [name] is a 22 y.o. G2P1001 at [redacted]w[redacted]d being seen today for ongoing prenatal care.  She is currently monitored for the following issues for this high-risk pregnancy and has Supervision of high risk pregnancy, antepartum; History of C-section; Hypertension; Obesity in pregnancy; H/O pre-eclampsia in prior pregnancy, currently pregnant; and Chronic hypertension affecting pregnancy on their problem list.  Patient reports no complaints.  Contractions: Not present. Vag. Bleeding: None.  Movement: Present. Denies leaking of fluid.   The following portions of the patient's history were reviewed and updated as appropriate: allergies, current medications, past family history, past medical history, past social history, past surgical history and problem list. Problem list updated.  Objective:   Vitals:   03/02/18 1131  BP: 111/73  Pulse: 98  Weight: 249 lb (112.9 kg)    Fetal Status: Fetal Heart Rate (bpm): 148   Movement: Present     General:  Alert, oriented and cooperative. Patient is in no acute distress.  Skin: Skin is warm and dry. No rash noted.   Cardiovascular: Normal heart rate noted  Respiratory: Normal respiratory effort, no problems with respiration noted  Abdomen: Soft, gravid, appropriate for gestational age.  Pain/Pressure: Present     Pelvic: Cervical exam deferred        Extremities: Normal range of motion.  Edema: None  Mental Status: Normal mood and affect. Normal behavior. Normal judgment and thought content.   Assessment and Plan:  Pregnancy: G2P1001 at [redacted]w[redacted]d  1. Supervision of high risk pregnancy, antepartum Normal BP and growth [place] nl today  Preterm labor symptoms and general obstetric precautions including but not limited to vaginal bleeding, contractions, leaking of fluid and fetal movement were reviewed in detail with the patient. Please refer to After Visit Summary for other counseling recommendations.  Return in about 2  weeks (around 03/16/2018).  Future Appointments  Date Time Provider Department Center  03/16/2018  8:30 AM [facility] [place] 1 [facility]  03/23/2018  8:30 AM [facility] [place] 5 [facility]  03/30/2018  9:30 AM [facility] [place] 5 [facility]    Scheryl Darter, MD

## 2018-03-02 NOTE — Patient Instructions (Signed)

## 2018-03-16 ENCOUNTER — Ambulatory Visit (HOSPITAL_COMMUNITY)
Admission: RE | Admit: 2018-03-16 | Discharge: 2018-03-16 | Disposition: A | Discharge status: Home / Self Care | Payer: Medicaid Other | Source: Ambulatory Visit | Attending: Obstetrics & Gynecology | Admitting: Obstetrics & Gynecology

## 2018-03-16 ENCOUNTER — Other Ambulatory Visit (HOSPITAL_COMMUNITY)
Admission: RE | Admit: 2018-03-16 | Discharge: 2018-03-16 | Disposition: A | Discharge status: Home / Self Care | Payer: Medicaid Other | Source: Ambulatory Visit | Attending: Obstetrics & Gynecology | Admitting: Obstetrics & Gynecology

## 2018-03-16 ENCOUNTER — Encounter: Payer: Self-pay | Admitting: Obstetrics & Gynecology

## 2018-03-16 ENCOUNTER — Encounter (HOSPITAL_COMMUNITY): Payer: Self-pay

## 2018-03-16 ENCOUNTER — Ambulatory Visit (INDEPENDENT_AMBULATORY_CARE_PROVIDER_SITE_OTHER): Payer: Medicaid Other | Admitting: Obstetrics & Gynecology

## 2018-03-16 VITALS — BP 116/75 | HR 97 | Wt 252.0 lb

## 2018-03-16 DIAGNOSIS — O99213 Obesity complicating pregnancy, third trimester: Secondary | ICD-10-CM | POA: Diagnosis not present

## 2018-03-16 DIAGNOSIS — O34219 Maternal care for unspecified type scar from previous cesarean delivery: Secondary | ICD-10-CM | POA: Insufficient documentation

## 2018-03-16 DIAGNOSIS — Z3A36 36 weeks gestation of pregnancy: Secondary | ICD-10-CM | POA: Diagnosis not present

## 2018-03-16 DIAGNOSIS — O0990 Supervision of high risk pregnancy, unspecified, unspecified trimester: Secondary | ICD-10-CM | POA: Diagnosis not present

## 2018-03-16 DIAGNOSIS — O09293 Supervision of pregnancy with other poor reproductive or obstetric history, third trimester: Secondary | ICD-10-CM | POA: Insufficient documentation

## 2018-03-16 DIAGNOSIS — O10919 Unspecified pre-existing hypertension complicating pregnancy, unspecified trimester: Secondary | ICD-10-CM

## 2018-03-16 DIAGNOSIS — O10013 Pre-existing essential hypertension complicating pregnancy, third trimester: Secondary | ICD-10-CM | POA: Insufficient documentation

## 2018-03-16 DIAGNOSIS — E669 Obesity, unspecified: Secondary | ICD-10-CM | POA: Diagnosis not present

## 2018-03-16 DIAGNOSIS — O0993 Supervision of high risk pregnancy, unspecified, third trimester: Secondary | ICD-10-CM | POA: Diagnosis not present

## 2018-03-16 DIAGNOSIS — Z98891 History of uterine scar from previous surgery: Secondary | ICD-10-CM

## 2018-03-16 NOTE — Patient Instructions (Signed)
Vaginal Birth After Cesarean Delivery Vaginal birth after cesarean delivery (VBAC) is giving birth vaginally after previously delivering a baby by a cesarean. In the past, if a woman had a cesarean delivery, all births afterward would be done by cesarean delivery. This is no longer true. It can be safe for the mother to try a vaginal delivery after having a cesarean delivery. It is important to discuss VBAC with your health care provider early in the pregnancy so you can understand the risks, benefits, and options. It will give you time to decide what is best in your particular case. The final decision about whether to have a VBAC or repeat cesarean delivery should be between you and your health care provider. Any changes in your health or your baby's health during your pregnancy may make it necessary to change your initial decision about VBAC. Women who plan to have a VBAC should check with their health care provider to be sure that:  The previous cesarean delivery was done with a low transverse uterine cut (incision) (not a vertical classical incision).  The birth canal is big enough for the baby.  There were no other operations on the uterus.  An electronic fetal monitor (EFM) will be on at all times during labor.  An operating room will be available and ready in case an emergency cesarean delivery is needed.  A health care provider and surgical nursing staff will be available at all times during labor to be ready to do an emergency delivery cesarean if necessary.  An anesthesiologist will be present in case an emergency cesarean delivery is needed.  The nursery is prepared and has adequate personnel and necessary equipment available to care for the baby in case of an emergency cesarean delivery. Benefits of VBAC  Shorter stay in the hospital.  Avoidance of risks associated with cesarean delivery, such as: ? Surgical complications, such as opening of the incision or hernia in the  incision. ? Injury to other organs. ? Fever. This can occur if an infection develops after surgery. It can also occur as a reaction to the medicine given to make you numb during the surgery.  Less blood loss and need for blood transfusions.  Lower risk of blood clots and infection.  Shorter recovery.  Decreased risk for having to remove the uterus (hysterectomy).  Decreased risk for the placenta to completely or partially cover the opening of the uterus (placenta previa) with a future pregnancy.  Decrease risk in future labor and delivery. Risks of a VBAC  Tearing (rupture) of the uterus. This is occurs in less than 1% of VBACs. The risk of this happening is higher if: ? Steps are taken to begin the labor process (induce labor) or stimulate or strengthen contractions (augment labor). ? Medicine is used to soften (ripen) the cervix.  Having to remove the uterus (hysterectomy) if it ruptures. VBAC should not be done if:  The previous cesarean delivery was done with a vertical (classical) or T-shaped incision or you do not know what kind of incision was made.  You had a ruptured uterus.  You have had certain types of surgery on your uterus, such as removal of uterine fibroids. Ask your health care provider about other types of surgeries that prevent you from having a VBAC.  You have certain medical or childbirth (obstetrical) problems.  There are problems with the baby.  You have had two previous cesarean deliveries and no vaginal deliveries. Other facts to know about VBAC:  It   is safe to have an epidural anesthetic with VBAC.  It is safe to turn the baby from a breech position (attempt an external cephalic version).  It is safe to try a VBAC with twins.  VBAC may not be successful if your baby weights 8.8 lb (4 kg) or more. However, weight predictions are not always accurate and should not be used alone to decide if VBAC is right for you.  There is an increased failure rate  if the time between the cesarean delivery and VBAC is less than 19 months.  Your health care provider may advise against a VBAC if you have preeclampsia (high blood pressure, protein in the urine, and swelling of face and extremities).  VBAC is often successful if you previously gave birth vaginally.  VBAC is often successful when the labor starts spontaneously before the due date.  Delivering a baby through a VBAC is similar to having a normal spontaneous vaginal delivery. This information is not intended to replace advice given to you by your health care provider. Make sure you discuss any questions you have with your health care provider. Document Released: 11/02/2006 Document Revised: 10/18/2015 Document Reviewed: 12/09/2012 Elsevier Interactive Patient Education  2018 [employer]  

## 2018-03-16 NOTE — Progress Notes (Signed)
   PRENATAL VISIT NOTE  Subjective:  Monica [name] is a 22 y.o. G2P1001 at [redacted]w[redacted]d being seen today for ongoing prenatal care.  She is currently monitored for the following issues for this high-risk pregnancy and has Supervision of high risk pregnancy, antepartum; History of C-section; Hypertension; Obesity in pregnancy; H/O pre-eclampsia in prior pregnancy, currently pregnant; and Chronic hypertension affecting pregnancy on their problem list.  Patient reports no complaints.  Contractions: Irregular. Vag. Bleeding: None.  Movement: Present. Denies leaking of fluid.   The following portions of the patient's history were reviewed and updated as appropriate: allergies, current medications, past family history, past medical history, past social history, past surgical history and problem list. Problem list updated.  Objective:   Vitals:   03/16/18 1101  BP: 116/75  Pulse: 97  Weight: 252 lb (114.3 kg)    Fetal Status: Fetal Heart Rate (bpm): 132   Movement: Present     General:  Alert, oriented and cooperative. Patient is in no acute distress.  Skin: Skin is warm and dry. No rash noted.   Cardiovascular: Normal heart rate noted  Respiratory: Normal respiratory effort, no problems with respiration noted  Abdomen: Soft, gravid, appropriate for gestational age.  Pain/Pressure: Present     Pelvic: Cervical exam performed        Extremities: Normal range of motion.  Edema: Trace  Mental Status: Normal mood and affect. Normal behavior. Normal judgment and thought content.   Assessment and Plan:  Pregnancy: G2P1001 at [redacted]w[redacted]d  1. Supervision of high risk pregnancy, antepartum Routine testing - Strep Gp B NAA - GC/Chlamydia probe amp ([place])not at [facility]  2. History of C-section Plans TOLAC  3. Chronic hypertension affecting pregnancy Normal BP, good fetal surveillance  Preterm labor symptoms and general obstetric precautions including but not limited to vaginal bleeding,  contractions, leaking of fluid and fetal movement were reviewed in detail with the patient. Please refer to After Visit Summary for other counseling recommendations.  Return in about 1 week (around 03/23/2018).  Future Appointments  Date Time Provider Department Center  03/23/2018  8:30 AM [facility] [place] 5 [facility]  03/30/2018  9:30 AM [facility] [place] 5 [facility]    Scheryl Darter, MD

## 2018-03-16 NOTE — Progress Notes (Signed)
ROB w/ no concerns today.  

## 2018-03-17 LAB — GC/CHLAMYDIA PROBE AMP (~~LOC~~) NOT AT ARMC
Chlamydia: NEGATIVE
Neisseria Gonorrhea: NEGATIVE

## 2018-03-18 LAB — STREP GP B NAA: Strep Gp B NAA: NEGATIVE

## 2018-03-23 ENCOUNTER — Ambulatory Visit (INDEPENDENT_AMBULATORY_CARE_PROVIDER_SITE_OTHER): Payer: Medicaid Other | Admitting: Obstetrics

## 2018-03-23 ENCOUNTER — Encounter: Payer: Self-pay | Admitting: Obstetrics

## 2018-03-23 ENCOUNTER — Ambulatory Visit (HOSPITAL_COMMUNITY)
Admission: RE | Admit: 2018-03-23 | Discharge: 2018-03-23 | Disposition: A | Discharge status: Home / Self Care | Payer: Medicaid Other | Source: Ambulatory Visit | Attending: Obstetrics & Gynecology | Admitting: Obstetrics & Gynecology

## 2018-03-23 ENCOUNTER — Encounter (HOSPITAL_COMMUNITY): Payer: Self-pay

## 2018-03-23 VITALS — BP 106/73 | HR 98 | Wt 253.0 lb

## 2018-03-23 DIAGNOSIS — O10913 Unspecified pre-existing hypertension complicating pregnancy, third trimester: Secondary | ICD-10-CM | POA: Diagnosis not present

## 2018-03-23 DIAGNOSIS — O09293 Supervision of pregnancy with other poor reproductive or obstetric history, third trimester: Secondary | ICD-10-CM | POA: Diagnosis not present

## 2018-03-23 DIAGNOSIS — Z3A37 37 weeks gestation of pregnancy: Secondary | ICD-10-CM | POA: Insufficient documentation

## 2018-03-23 DIAGNOSIS — O10919 Unspecified pre-existing hypertension complicating pregnancy, unspecified trimester: Secondary | ICD-10-CM

## 2018-03-23 DIAGNOSIS — O99213 Obesity complicating pregnancy, third trimester: Secondary | ICD-10-CM

## 2018-03-23 DIAGNOSIS — O10013 Pre-existing essential hypertension complicating pregnancy, third trimester: Secondary | ICD-10-CM | POA: Diagnosis not present

## 2018-03-23 DIAGNOSIS — O34219 Maternal care for unspecified type scar from previous cesarean delivery: Secondary | ICD-10-CM | POA: Diagnosis not present

## 2018-03-23 DIAGNOSIS — Z98891 History of uterine scar from previous surgery: Secondary | ICD-10-CM

## 2018-03-23 DIAGNOSIS — O09299 Supervision of pregnancy with other poor reproductive or obstetric history, unspecified trimester: Secondary | ICD-10-CM

## 2018-03-23 DIAGNOSIS — O0990 Supervision of high risk pregnancy, unspecified, unspecified trimester: Secondary | ICD-10-CM

## 2018-03-23 DIAGNOSIS — O0993 Supervision of high risk pregnancy, unspecified, third trimester: Secondary | ICD-10-CM

## 2018-03-23 DIAGNOSIS — O10019 Pre-existing essential hypertension complicating pregnancy, unspecified trimester: Secondary | ICD-10-CM | POA: Diagnosis present

## 2018-03-23 NOTE — Progress Notes (Signed)
GBS NEG on 03/16/18 GC/CT : NEG on 03/16/18  Pt has no concerns today.

## 2018-03-23 NOTE — Progress Notes (Signed)
Subjective:  Monica [name] is a 22 y.o. G2P1001 at [redacted]w[redacted]d being seen today for ongoing prenatal care.  She is currently monitored for the following issues for this high-risk pregnancy and has Supervision of high risk pregnancy, antepartum; History of C-section; Hypertension; Obesity in pregnancy; H/O pre-eclampsia in prior pregnancy, currently pregnant; and Chronic hypertension affecting pregnancy on their problem list.  Patient reports no complaints.  Contractions: Irregular. Vag. Bleeding: None.  Movement: Present. Denies leaking of fluid.   The following portions of the patient's history were reviewed and updated as appropriate: allergies, current medications, past family history, past medical history, past social history, past surgical history and problem list. Problem list updated.  Objective:   Vitals:   03/23/18 1015  BP: 106/73  Pulse: 98  Weight: 253 lb (114.8 kg)    Fetal Status: Fetal Heart Rate (bpm): 150   Movement: Present     General:  Alert, oriented and cooperative. Patient is in no acute distress.  Skin: Skin is warm and dry. No rash noted.   Cardiovascular: Normal heart rate noted  Respiratory: Normal respiratory effort, no problems with respiration noted  Abdomen: Soft, gravid, appropriate for gestational age. Pain/Pressure: Present     Pelvic:  Cervical exam deferred        Extremities: Normal range of motion.  Edema: Trace  Mental Status: Normal mood and affect. Normal behavior. Normal judgment and thought content.   Urinalysis:      Assessment and Plan:  Pregnancy: G2P1001 at [redacted]w[redacted]d  1. Supervision of high risk pregnancy, antepartum  2. History of C-section - TOLAC agreed to.  Consent signrd.  3. Chronic hypertension affecting pregnancy - stable  4. H/O pre-eclampsia in prior pregnancy, currently pregnant   Term labor symptoms and general obstetric precautions including but not limited to vaginal bleeding, contractions, leaking of fluid and fetal  movement were reviewed in detail with the patient. Please refer to After Visit Summary for other counseling recommendations.  Return in about 1 week (around 03/30/2018) for ROB.   Brock Bad, MD

## 2018-03-25 ENCOUNTER — Other Ambulatory Visit: Payer: Self-pay | Admitting: Obstetrics

## 2018-03-30 ENCOUNTER — Encounter (HOSPITAL_COMMUNITY): Payer: Self-pay

## 2018-03-30 ENCOUNTER — Ambulatory Visit (HOSPITAL_COMMUNITY)
Admission: RE | Admit: 2018-03-30 | Discharge: 2018-03-30 | Disposition: A | Discharge status: Home / Self Care | Payer: Medicaid Other | Source: Ambulatory Visit | Attending: Obstetrics | Admitting: Obstetrics

## 2018-03-30 ENCOUNTER — Other Ambulatory Visit (HOSPITAL_COMMUNITY): Payer: Self-pay | Admitting: *Deleted

## 2018-03-30 DIAGNOSIS — O99213 Obesity complicating pregnancy, third trimester: Secondary | ICD-10-CM | POA: Diagnosis not present

## 2018-03-30 DIAGNOSIS — O09293 Supervision of pregnancy with other poor reproductive or obstetric history, third trimester: Secondary | ICD-10-CM

## 2018-03-30 DIAGNOSIS — O10013 Pre-existing essential hypertension complicating pregnancy, third trimester: Secondary | ICD-10-CM

## 2018-03-30 DIAGNOSIS — O10913 Unspecified pre-existing hypertension complicating pregnancy, third trimester: Secondary | ICD-10-CM | POA: Diagnosis not present

## 2018-03-30 DIAGNOSIS — Z362 Encounter for other antenatal screening follow-up: Secondary | ICD-10-CM

## 2018-03-30 DIAGNOSIS — Z3A38 38 weeks gestation of pregnancy: Secondary | ICD-10-CM | POA: Diagnosis not present

## 2018-03-30 DIAGNOSIS — O10919 Unspecified pre-existing hypertension complicating pregnancy, unspecified trimester: Secondary | ICD-10-CM

## 2018-03-31 ENCOUNTER — Ambulatory Visit (INDEPENDENT_AMBULATORY_CARE_PROVIDER_SITE_OTHER): Payer: Medicaid Other | Admitting: Obstetrics

## 2018-03-31 ENCOUNTER — Encounter: Payer: Self-pay | Admitting: Obstetrics

## 2018-03-31 VITALS — BP 110/74 | HR 99 | Wt 256.8 lb

## 2018-03-31 DIAGNOSIS — O99210 Obesity complicating pregnancy, unspecified trimester: Secondary | ICD-10-CM

## 2018-03-31 DIAGNOSIS — O09299 Supervision of pregnancy with other poor reproductive or obstetric history, unspecified trimester: Secondary | ICD-10-CM

## 2018-03-31 DIAGNOSIS — O0990 Supervision of high risk pregnancy, unspecified, unspecified trimester: Secondary | ICD-10-CM

## 2018-03-31 DIAGNOSIS — Z98891 History of uterine scar from previous surgery: Secondary | ICD-10-CM

## 2018-03-31 DIAGNOSIS — O3663X0 Maternal care for excessive fetal growth, third trimester, not applicable or unspecified: Secondary | ICD-10-CM

## 2018-03-31 NOTE — Progress Notes (Signed)
Subjective:  Monica [name] is a 22 y.o. G2P1001 at [redacted]w[redacted]d being seen today for ongoing prenatal care.  She is currently monitored for the following issues for this high-risk pregnancy and has Supervision of high risk pregnancy, antepartum; History of C-section; Hypertension; Obesity in pregnancy; H/O pre-eclampsia in prior pregnancy, currently pregnant; and Chronic hypertension affecting pregnancy on their problem list.  Patient reports no complaints.  Contractions: Irregular. Vag. Bleeding: None.  Movement: Present. Denies leaking of fluid.   The following portions of the patient's history were reviewed and updated as appropriate: allergies, current medications, past family history, past medical history, past social history, past surgical history and problem list. Problem list updated.  Objective:   Vitals:   03/31/18 1126  BP: 110/74  Pulse: 99  Weight: 256 lb 12.8 oz (116.5 kg)    Fetal Status: Fetal Heart Rate (bpm): 140   Movement: Present     General:  Alert, oriented and cooperative. Patient is in no acute distress.  Skin: Skin is warm and dry. No rash noted.   Cardiovascular: Normal heart rate noted  Respiratory: Normal respiratory effort, no problems with respiration noted  Abdomen: Soft, gravid, appropriate for gestational age. Pain/Pressure: Present     Pelvic:  Cervical exam deferred        Extremities: Normal range of motion.  Edema: Trace  Mental Status: Normal mood and affect. Normal behavior. Normal judgment and thought content.   Urinalysis:       ULTRASOUND: [place] MFM OB FOLLOW UP (Accession [id number]) (Order [id number])  Imaging  Date: 03/30/2018 Department: [facility] MATERNAL FETAL CARE ULTRASOUND Released By: Elesa Hacker Authorizing: Noralee Space, MD  Exam Information   Status Exam Begun  Exam Ended   Final [99] 03/30/2018 9:38 AM 03/30/2018 10:45 AM  PACS Images   Show images for [place] MFM OB FOLLOW UP  Study Result    ----------------------------------------------------------------------  OBSTETRICS REPORT                       (Signed Final 03/30/2018 10:43 am) ---------------------------------------------------------------------- Patient Info  ID #:       [id number]                          D.O.B.:  Jun 26, 1995 (22 yrs)  Name:       Joesphine Bare [name]                   Visit Date: 03/30/2018 09:41 am ---------------------------------------------------------------------- Performed By  Performed By:     Tomma Lightning             Ref. Address:     [address]                    [address]                                                             [id number]  [place]                                                             [address]  Attending:        Lin Landsman      Location:         [facility]                    MD  Referred By:      Roe Coombs CNM ---------------------------------------------------------------------- Orders   #  Description                          Code         Ordered By   1  [place] MFM FETAL BPP WO NON              76819.01     RAVI SHANKAR      STRESS   2  [place] MFM OB FOLLOW UP                  [id number]     [facility]VI [facility]  ----------------------------------------------------------------------   #  Order #                    Accession #                 Episode #   1  [id number]                  [id number]   2  [id number]                  [id number]                  [id number]  ---------------------------------------------------------------------- Indications   Encounter for other antenatal screening        Z36.2   follow-up   [redacted] weeks gestation of pregnancy                Z3A.38   Hypertension - Chronic/Pre-existing            O10.019   Previous cesarean delivery, antepartum         O34.219   Obesity complicating pregnancy, third          O99.213   trimester    Poor obstetric history: Previous preeclampsia  O09.299  ---------------------------------------------------------------------- Vital Signs  Weight (lb): 257                               Height:        5'1"  BMI:         48.55        Pulse:  97  BP:          112/65 ---------------------------------------------------------------------- Fetal Evaluation  Num Of Fetuses:         1  Fetal Heart Rate(bpm):  135  Cardiac Activity:       Observed  Presentation:           Cephalic  Placenta:               Anterior  P. Cord Insertion:      Previously Visualized  Amniotic Fluid  AFI FV:      Within normal limits  AFI Sum(cm)     %Tile       Largest Pocket(cm)  10.41           30          4.01  RUQ(cm)       RLQ(cm)       LUQ(cm)        LLQ(cm)  2.27          2.43          4.01           1.7 ---------------------------------------------------------------------- Biophysical Evaluation  Amniotic F.V:   Within normal limits       F. Tone:        Observed  F. Movement:    Observed                   Score:          8/8  F. Breathing:   Observed ---------------------------------------------------------------------- Biometry  BPD:      95.6  mm     G. Age:  39w 0d         86  %    CI:        71.74   %    70 - 86                                                          FL/HC:      20.8   %    20.6 - 23.4  HC:      359.3  mm     G. Age:  N/A            97  %    HC/AC:      0.91        0.87 - 1.06  AC:       396   mm     G. Age:  N/A          > 97  %    FL/BPD:     78.2   %    71 - 87  FL:       74.8  mm     G. Age:  38w 2d         47  %    FL/AC:      18.9   %    20 - 24  LV:        4.8  mm  Est. FW:    4547  gm         10 lb    > 90  % ---------------------------------------------------------------------- OB History  Gravidity:    2         Term:   1 ---------------------------------------------------------------------- Gestational Age  LMP:           38w 4d        Date:  07/03/17  EDD:   04/09/18  U/S Today:     38w 5d                                        EDD:   04/08/18  Best:          38w 4d     Det. By:  LMP  (07/03/17)          EDD:   04/09/18 ---------------------------------------------------------------------- Anatomy  Cranium:               Appears normal         Aortic Arch:            Previously seen  Cavum:                 Previously seen        Ductal Arch:            Previously seen  Ventricles:            Appears normal         Diaphragm:              Appears normal  Choroid Plexus:        Previously seen        Stomach:                Appears normal, left                                                                        sided  Cerebellum:            Previously seen        Abdomen:                Previously seen  Posterior Fossa:       Previously seen        Abdominal Wall:         Previously seen  Nuchal Fold:           Not applicable (>20    Cord Vessels:           Previously seen                         wks GA)  Face:                  Orbits and profile     Kidneys:                Appear normal                         previously seen  Lips:                  Previously seen        Bladder:                Appears normal  Thoracic:              Appears normal         Spine:  Previously seen  Heart:                 Previously seen        Upper Extremities:      Previously seen  RVOT:                  Previously seen        Lower Extremities:      Previously seen  LVOT:                  Previously seen  Other:  Fetus appears to be a female prev. vis. Nasal bone prev visualized.          Technically difficult due to maternal habitus and fetal position. ---------------------------------------------------------------------- Cervix Uterus Adnexa  Cervix  Not visualized (advanced GA >24wks) ---------------------------------------------------------------------- Comments  I discussed below findings and recommendations with Ms.  Seide ---------------------------------------------------------------------- Impression  Normal interval growth.  EFW>90th% AC >97%10 lb- no documentation of  GDM/T2DM- normal 2hr GTT 01/2018 ---------------------------------------------------------------------- Recommendations  Consider counseling regarding fetal size and discussion of  NSVD vs cesarean delivery and caution around operative  delivery  Given history CHTN continue weekly testing  Consider delivery at 39 weeks. ----------------------------------------------------------------------               Lin Landsman, MD Electronically Signed Final Report   03/30/2018 10:43 am ----------------------------------------------------------------------    Assessment and Plan:  Pregnancy: G2P1001 at [redacted]w[redacted]d  1. Supervision of high risk pregnancy, antepartum  2. History of C-section - was planning VBAC, but ultrasound yesterday revealed an EFW of ~ 4500 gms - she is now requesting a repeat C/S  3. H/O pre-eclampsia in prior pregnancy, currently pregnant - stable clinically  4. Excessive fetal growth affecting management of pregnancy in third trimester, single or unspecified fetus ( see U/S report )  5. Obesity in pregnancy   Term labor symptoms and general obstetric precautions including but not limited to vaginal bleeding, contractions, leaking of fluid and fetal movement were reviewed in detail with the patient. Please refer to After Visit Summary for other counseling recommendations.  Return in about 4 weeks (around 04/28/2018) for Postpartum Care.   Brock Bad, MD

## 2018-04-01 ENCOUNTER — Encounter (HOSPITAL_COMMUNITY): Payer: Self-pay | Admitting: *Deleted

## 2018-04-02 ENCOUNTER — Inpatient Hospital Stay (HOSPITAL_COMMUNITY): Payer: Medicaid Other

## 2018-04-02 ENCOUNTER — Other Ambulatory Visit: Payer: Self-pay

## 2018-04-02 ENCOUNTER — Encounter (HOSPITAL_COMMUNITY)
Admission: RE | Disposition: A | Discharge status: Home / Self Care | Payer: Self-pay | Source: Home / Self Care | Attending: Obstetrics & Gynecology

## 2018-04-02 ENCOUNTER — Inpatient Hospital Stay (HOSPITAL_COMMUNITY)
Admission: RE | Admit: 2018-04-02 | Discharge: 2018-04-04 | DRG: 787 | Disposition: A | Discharge status: Home / Self Care | Payer: Medicaid Other | Attending: Obstetrics & Gynecology | Admitting: Obstetrics & Gynecology

## 2018-04-02 ENCOUNTER — Encounter (HOSPITAL_COMMUNITY): Payer: Self-pay

## 2018-04-02 DIAGNOSIS — O9902 Anemia complicating childbirth: Secondary | ICD-10-CM | POA: Diagnosis not present

## 2018-04-02 DIAGNOSIS — Z98891 History of uterine scar from previous surgery: Secondary | ICD-10-CM

## 2018-04-02 DIAGNOSIS — O1002 Pre-existing essential hypertension complicating childbirth: Secondary | ICD-10-CM | POA: Diagnosis present

## 2018-04-02 DIAGNOSIS — I10 Essential (primary) hypertension: Secondary | ICD-10-CM | POA: Diagnosis present

## 2018-04-02 DIAGNOSIS — O99210 Obesity complicating pregnancy, unspecified trimester: Secondary | ICD-10-CM | POA: Diagnosis present

## 2018-04-02 DIAGNOSIS — Z3A39 39 weeks gestation of pregnancy: Secondary | ICD-10-CM

## 2018-04-02 DIAGNOSIS — O3663X0 Maternal care for excessive fetal growth, third trimester, not applicable or unspecified: Secondary | ICD-10-CM | POA: Diagnosis not present

## 2018-04-02 DIAGNOSIS — O99214 Obesity complicating childbirth: Secondary | ICD-10-CM | POA: Diagnosis present

## 2018-04-02 DIAGNOSIS — O09299 Supervision of pregnancy with other poor reproductive or obstetric history, unspecified trimester: Secondary | ICD-10-CM

## 2018-04-02 DIAGNOSIS — O10919 Unspecified pre-existing hypertension complicating pregnancy, unspecified trimester: Secondary | ICD-10-CM | POA: Diagnosis present

## 2018-04-02 DIAGNOSIS — D509 Iron deficiency anemia, unspecified: Secondary | ICD-10-CM | POA: Diagnosis present

## 2018-04-02 DIAGNOSIS — O34211 Maternal care for low transverse scar from previous cesarean delivery: Secondary | ICD-10-CM

## 2018-04-02 DIAGNOSIS — O0990 Supervision of high risk pregnancy, unspecified, unspecified trimester: Secondary | ICD-10-CM

## 2018-04-02 LAB — TYPE AND SCREEN
ABO/RH(D): O POS
Antibody Screen: NEGATIVE

## 2018-04-02 LAB — CBC
HCT: 38.5 % (ref 36.0–46.0)
Hemoglobin: 12.1 g/dL (ref 12.0–15.0)
MCH: 24 pg — ABNORMAL LOW (ref 26.0–34.0)
MCHC: 31.4 g/dL (ref 30.0–36.0)
MCV: 76.4 fL — ABNORMAL LOW (ref 80.0–100.0)
Platelets: 313 10*3/uL (ref 150–400)
RBC: 5.04 MIL/uL (ref 3.87–5.11)
RDW: 15.4 % (ref 11.5–15.5)
WBC: 12.2 10*3/uL — ABNORMAL HIGH (ref 4.0–10.5)
nRBC: 0 % (ref 0.0–0.2)

## 2018-04-02 SURGERY — Surgical Case
Anesthesia: Spinal

## 2018-04-02 MED ORDER — FENTANYL CITRATE (PF) 100 MCG/2ML IJ SOLN
INTRAMUSCULAR | Status: AC
  Filled 2018-04-02: qty 2

## 2018-04-02 MED ORDER — ONDANSETRON HCL 4 MG/2ML IJ SOLN
INTRAMUSCULAR | Status: AC
  Filled 2018-04-02: qty 2

## 2018-04-02 MED ORDER — KETOROLAC TROMETHAMINE 30 MG/ML IJ SOLN
INTRAMUSCULAR | Status: AC
  Filled 2018-04-02: qty 1

## 2018-04-02 MED ORDER — TETANUS-DIPHTH-ACELL PERTUSSIS 5-2.5-18.5 LF-MCG/0.5 IM SUSP: 0.5000 mL | Freq: Once | INTRAMUSCULAR | Status: DC

## 2018-04-02 MED ORDER — NALBUPHINE HCL 10 MG/ML IJ SOLN: 5.0000 mg | INTRAMUSCULAR | Status: DC | PRN

## 2018-04-02 MED ORDER — LACTATED RINGERS IV SOLN
INTRAVENOUS | Status: DC
  Administered 2018-04-02 (×3): via INTRAVENOUS

## 2018-04-02 MED ORDER — SOD CITRATE-CITRIC ACID 500-334 MG/5ML PO SOLN
30.0000 mL | Freq: Once | ORAL | Status: AC
  Administered 2018-04-02: 30 mL via ORAL
  Filled 2018-04-02: qty 15

## 2018-04-02 MED ORDER — SODIUM BICARBONATE 8.4 % IV SOLN
INTRAVENOUS | Status: AC
  Filled 2018-04-02: qty 50

## 2018-04-02 MED ORDER — MORPHINE SULFATE (PF) 0.5 MG/ML IJ SOLN
INTRAMUSCULAR | Status: AC
  Filled 2018-04-02: qty 10

## 2018-04-02 MED ORDER — PRENATAL MULTIVITAMIN CH
1.0000 | ORAL_TABLET | Freq: Every day | ORAL | Status: DC
  Administered 2018-04-03: 1 via ORAL
  Filled 2018-04-02: qty 1

## 2018-04-02 MED ORDER — CEFAZOLIN SODIUM-DEXTROSE 2-4 GM/100ML-% IV SOLN
2.0000 g | INTRAVENOUS | Status: AC
  Administered 2018-04-02: 2 g via INTRAVENOUS
  Filled 2018-04-02: qty 100

## 2018-04-02 MED ORDER — FENTANYL CITRATE (PF) 100 MCG/2ML IJ SOLN: 25.0000 ug | INTRAMUSCULAR | Status: DC | PRN

## 2018-04-02 MED ORDER — PHENYLEPHRINE 8 MG IN D5W 100 ML (0.08MG/ML) PREMIX OPTIME
INJECTION | INTRAVENOUS | Status: AC
  Filled 2018-04-02: qty 100

## 2018-04-02 MED ORDER — DEXAMETHASONE SODIUM PHOSPHATE 4 MG/ML IJ SOLN
INTRAMUSCULAR | Status: AC
  Filled 2018-04-02: qty 1

## 2018-04-02 MED ORDER — PHENYLEPHRINE 40 MCG/ML (10ML) SYRINGE FOR IV PUSH (FOR BLOOD PRESSURE SUPPORT)
PREFILLED_SYRINGE | INTRAVENOUS | Status: DC | PRN
  Administered 2018-04-02 (×2): 80 ug via INTRAVENOUS

## 2018-04-02 MED ORDER — WITCH HAZEL-GLYCERIN EX PADS: 1.0000 "application " | MEDICATED_PAD | CUTANEOUS | Status: DC | PRN

## 2018-04-02 MED ORDER — FENTANYL CITRATE (PF) 100 MCG/2ML IJ SOLN
INTRAMUSCULAR | Status: DC | PRN
  Administered 2018-04-02: 15 ug via INTRATHECAL

## 2018-04-02 MED ORDER — BUPIVACAINE IN DEXTROSE 0.75-8.25 % IT SOLN
INTRATHECAL | Status: DC | PRN
  Administered 2018-04-02: 10.5 mg via INTRATHECAL

## 2018-04-02 MED ORDER — SIMETHICONE 80 MG PO CHEW
80.0000 mg | CHEWABLE_TABLET | ORAL | Status: DC
  Administered 2018-04-03: 80 mg via ORAL
  Filled 2018-04-02 (×2): qty 1

## 2018-04-02 MED ORDER — SIMETHICONE 80 MG PO CHEW
80.0000 mg | CHEWABLE_TABLET | ORAL | Status: DC | PRN
  Administered 2018-04-02: 80 mg via ORAL

## 2018-04-02 MED ORDER — KETOROLAC TROMETHAMINE 30 MG/ML IJ SOLN
30.0000 mg | Freq: Four times a day (QID) | INTRAMUSCULAR | Status: AC | PRN
  Administered 2018-04-02: 30 mg via INTRAMUSCULAR

## 2018-04-02 MED ORDER — NALOXONE HCL 4 MG/10ML IJ SOLN
1.0000 ug/kg/h | INTRAVENOUS | Status: DC | PRN
  Filled 2018-04-02: qty 5

## 2018-04-02 MED ORDER — DIPHENHYDRAMINE HCL 25 MG PO CAPS
25.0000 mg | ORAL_CAPSULE | ORAL | Status: DC | PRN
  Administered 2018-04-03: 25 mg via ORAL
  Filled 2018-04-02: qty 1

## 2018-04-02 MED ORDER — LACTATED RINGERS IV SOLN
INTRAVENOUS | Status: DC
  Administered 2018-04-02: 19:00:00 via INTRAVENOUS

## 2018-04-02 MED ORDER — SODIUM CHLORIDE 0.9% FLUSH: 3.0000 mL | INTRAVENOUS | Status: DC | PRN

## 2018-04-02 MED ORDER — DIBUCAINE 1 % RE OINT: 1.0000 "application " | TOPICAL_OINTMENT | RECTAL | Status: DC | PRN

## 2018-04-02 MED ORDER — DIPHENHYDRAMINE HCL 25 MG PO CAPS
25.0000 mg | ORAL_CAPSULE | Freq: Four times a day (QID) | ORAL | Status: DC | PRN
  Filled 2018-04-02: qty 1

## 2018-04-02 MED ORDER — PHENYLEPHRINE 8 MG IN D5W 100 ML (0.08MG/ML) PREMIX OPTIME
INJECTION | INTRAVENOUS | Status: DC | PRN
  Administered 2018-04-02: 80 ug/min via INTRAVENOUS
  Administered 2018-04-02: 60 ug/min via INTRAVENOUS

## 2018-04-02 MED ORDER — MEPERIDINE HCL 25 MG/ML IJ SOLN: 6.2500 mg | INTRAMUSCULAR | Status: DC | PRN

## 2018-04-02 MED ORDER — DIPHENHYDRAMINE HCL 50 MG/ML IJ SOLN
INTRAMUSCULAR | Status: AC
  Filled 2018-04-02: qty 1

## 2018-04-02 MED ORDER — SCOPOLAMINE 1 MG/3DAYS TD PT72
1.0000 | MEDICATED_PATCH | Freq: Once | TRANSDERMAL | Status: DC
  Administered 2018-04-02: 1.5 mg via TRANSDERMAL
  Filled 2018-04-02: qty 1

## 2018-04-02 MED ORDER — LIDOCAINE-EPINEPHRINE (PF) 2 %-1:200000 IJ SOLN
INTRAMUSCULAR | Status: AC
  Filled 2018-04-02: qty 20

## 2018-04-02 MED ORDER — MEASLES, MUMPS & RUBELLA VAC IJ SOLR: 0.5000 mL | Freq: Once | INTRAMUSCULAR | Status: DC

## 2018-04-02 MED ORDER — COCONUT OIL OIL: 1.0000 "application " | TOPICAL_OIL | Status: DC | PRN

## 2018-04-02 MED ORDER — ACETAMINOPHEN 325 MG PO TABS
650.0000 mg | ORAL_TABLET | ORAL | Status: DC | PRN
  Administered 2018-04-02 – 2018-04-04 (×4): 650 mg via ORAL
  Filled 2018-04-02 (×4): qty 2

## 2018-04-02 MED ORDER — OXYTOCIN 10 UNIT/ML IJ SOLN
INTRAVENOUS | Status: DC | PRN
  Administered 2018-04-02: 40 [IU] via INTRAVENOUS

## 2018-04-02 MED ORDER — NALBUPHINE HCL 10 MG/ML IJ SOLN
5.0000 mg | INTRAMUSCULAR | Status: DC | PRN
  Administered 2018-04-02: 5 mg via INTRAVENOUS
  Filled 2018-04-02: qty 1

## 2018-04-02 MED ORDER — KETOROLAC TROMETHAMINE 30 MG/ML IJ SOLN: 30.0000 mg | Freq: Four times a day (QID) | INTRAMUSCULAR | Status: AC | PRN

## 2018-04-02 MED ORDER — IBUPROFEN 600 MG PO TABS
600.0000 mg | ORAL_TABLET | Freq: Four times a day (QID) | ORAL | Status: DC
  Administered 2018-04-02 – 2018-04-04 (×6): 600 mg via ORAL
  Filled 2018-04-02 (×6): qty 1

## 2018-04-02 MED ORDER — MORPHINE SULFATE (PF) 0.5 MG/ML IJ SOLN
INTRAMUSCULAR | Status: DC | PRN
  Administered 2018-04-02: .15 mg via INTRATHECAL

## 2018-04-02 MED ORDER — ENOXAPARIN SODIUM 60 MG/0.6ML ~~LOC~~ SOLN
0.5000 mg/kg | SUBCUTANEOUS | Status: DC
  Administered 2018-04-03: 60 mg via SUBCUTANEOUS
  Filled 2018-04-02 (×2): qty 0.6

## 2018-04-02 MED ORDER — NALBUPHINE HCL 10 MG/ML IJ SOLN: 5.0000 mg | Freq: Once | INTRAMUSCULAR | Status: DC | PRN

## 2018-04-02 MED ORDER — SODIUM CHLORIDE 0.9 % IR SOLN
Status: DC | PRN
  Administered 2018-04-02: 1

## 2018-04-02 MED ORDER — MISOPROSTOL 200 MCG PO TABS
ORAL_TABLET | ORAL | Status: AC
  Filled 2018-04-02: qty 2

## 2018-04-02 MED ORDER — MENTHOL 3 MG MT LOZG: 1.0000 | LOZENGE | OROMUCOSAL | Status: DC | PRN

## 2018-04-02 MED ORDER — DEXAMETHASONE SODIUM PHOSPHATE 4 MG/ML IJ SOLN
INTRAMUSCULAR | Status: DC | PRN
  Administered 2018-04-02: 4 mg via INTRAVENOUS

## 2018-04-02 MED ORDER — SENNOSIDES-DOCUSATE SODIUM 8.6-50 MG PO TABS
2.0000 | ORAL_TABLET | ORAL | Status: DC
  Administered 2018-04-02 – 2018-04-03 (×2): 2 via ORAL
  Filled 2018-04-02 (×2): qty 2

## 2018-04-02 MED ORDER — ZOLPIDEM TARTRATE 5 MG PO TABS: 5.0000 mg | ORAL_TABLET | Freq: Every evening | ORAL | Status: DC | PRN

## 2018-04-02 MED ORDER — PHENYLEPHRINE 40 MCG/ML (10ML) SYRINGE FOR IV PUSH (FOR BLOOD PRESSURE SUPPORT)
PREFILLED_SYRINGE | INTRAVENOUS | Status: AC
  Filled 2018-04-02: qty 10

## 2018-04-02 MED ORDER — ONDANSETRON HCL 4 MG/2ML IJ SOLN: 4.0000 mg | Freq: Three times a day (TID) | INTRAMUSCULAR | Status: DC | PRN

## 2018-04-02 MED ORDER — NALOXONE HCL 0.4 MG/ML IJ SOLN: 0.4000 mg | INTRAMUSCULAR | Status: DC | PRN

## 2018-04-02 MED ORDER — OXYTOCIN 40 UNITS IN LACTATED RINGERS INFUSION - SIMPLE MED: 2.5000 [IU]/h | INTRAVENOUS | Status: AC

## 2018-04-02 MED ORDER — ONDANSETRON HCL 4 MG/2ML IJ SOLN
INTRAMUSCULAR | Status: DC | PRN
  Administered 2018-04-02: 4 mg via INTRAVENOUS

## 2018-04-02 MED ORDER — DIPHENHYDRAMINE HCL 50 MG/ML IJ SOLN
12.5000 mg | INTRAMUSCULAR | Status: DC | PRN
  Administered 2018-04-02: 12.5 mg via INTRAVENOUS

## 2018-04-02 MED ORDER — LACTATED RINGERS IV SOLN
INTRAVENOUS | Status: DC
  Administered 2018-04-02: 13:00:00 via INTRAVENOUS

## 2018-04-02 SURGICAL SUPPLY — 40 items
BENZOIN TINCTURE PRP APPL 2/3 (GAUZE/BANDAGES/DRESSINGS) ×3 IMPLANT
CHLORAPREP W/TINT 26ML (MISCELLANEOUS) ×3 IMPLANT
CLAMP CORD UMBIL (MISCELLANEOUS) IMPLANT
CLOSURE STERI-STRIP 1/2X4 (GAUZE/BANDAGES/DRESSINGS) ×1
CLOTH BEACON ORANGE TIMEOUT ST (SAFETY) ×3 IMPLANT
CLSR STERI-STRIP ANTIMIC 1/2X4 (GAUZE/BANDAGES/DRESSINGS) ×2 IMPLANT
DRAIN JACKSON PRT FLT 7MM (DRAIN) IMPLANT
DRSG OPSITE POSTOP 4X10 (GAUZE/BANDAGES/DRESSINGS) ×3 IMPLANT
ELECT REM PT RETURN 9FT ADLT (ELECTROSURGICAL) ×3
ELECTRODE REM PT RTRN 9FT ADLT (ELECTROSURGICAL) ×1 IMPLANT
EVACUATOR SILICONE 100CC (DRAIN) IMPLANT
EXTRACTOR VACUUM M CUP 4 TUBE (SUCTIONS) IMPLANT
EXTRACTOR VACUUM M CUP 4' TUBE (SUCTIONS)
GAUZE SPONGE 4X4 12PLY STRL LF (GAUZE/BANDAGES/DRESSINGS) ×6 IMPLANT
GLOVE BIO SURGEON STRL SZ7 (GLOVE) ×3 IMPLANT
GLOVE BIOGEL PI IND STRL 7.0 (GLOVE) ×2 IMPLANT
GLOVE BIOGEL PI INDICATOR 7.0 (GLOVE) ×4
GOWN STRL REUS W/TWL LRG LVL3 (GOWN DISPOSABLE) ×6 IMPLANT
HOVERMATT SINGLE USE (MISCELLANEOUS) ×3 IMPLANT
KIT ABG SYR 3ML LUER SLIP (SYRINGE) IMPLANT
NEEDLE HYPO 25X5/8 SAFETYGLIDE (NEEDLE) ×3 IMPLANT
NS IRRIG 1000ML POUR BTL (IV SOLUTION) ×3 IMPLANT
PACK C SECTION WH (CUSTOM PROCEDURE TRAY) ×3 IMPLANT
PAD ABD 7.5X8 STRL (GAUZE/BANDAGES/DRESSINGS) ×3 IMPLANT
PAD OB MATERNITY 4.3X12.25 (PERSONAL CARE ITEMS) ×3 IMPLANT
PENCIL SMOKE EVAC W/HOLSTER (ELECTROSURGICAL) ×3 IMPLANT
RETRACTOR TRAXI PANNICULUS (MISCELLANEOUS) ×1 IMPLANT
RTRCTR C-SECT PINK 25CM LRG (MISCELLANEOUS) ×3 IMPLANT
SPONGE LAP 18X18 RF (DISPOSABLE) ×9 IMPLANT
SUT CHROMIC 2 0 CT 1 (SUTURE) ×3 IMPLANT
SUT MNCRL 0 VIOLET CTX 36 (SUTURE) ×1 IMPLANT
SUT MONOCRYL 0 CTX 36 (SUTURE) ×2
SUT PLAIN 2 0 XLH (SUTURE) ×3 IMPLANT
SUT VIC AB 0 CTX 36 (SUTURE) ×10
SUT VIC AB 0 CTX36XBRD ANBCTRL (SUTURE) ×5 IMPLANT
SUT VIC AB 4-0 KS 27 (SUTURE) ×3 IMPLANT
SYR KIT LINE DRAW 1CC W/FILTR (LINER) ×3 IMPLANT
TOWEL OR 17X24 6PK STRL BLUE (TOWEL DISPOSABLE) ×3 IMPLANT
TRAXI PANNICULUS RETRACTOR (MISCELLANEOUS) ×2
TRAY FOLEY W/BAG SLVR 14FR LF (SET/KITS/TRAYS/PACK) ×3 IMPLANT

## 2018-04-02 NOTE — Anesthesia Postprocedure Evaluation (Signed)
Anesthesia Post Note  Patient: Monica [name]  Procedure(s) Performed: CESAREAN SECTION (N/A )     Patient location during evaluation: PACU Anesthesia Type: Spinal Level of consciousness: oriented and awake and alert Pain management: pain level controlled Vital Signs Assessment: post-procedure vital signs reviewed and stable Respiratory status: spontaneous breathing, respiratory function stable and nonlabored ventilation Cardiovascular status: blood pressure returned to baseline and stable Postop Assessment: no headache, no backache, no apparent nausea or vomiting, patient able to bend at knees and spinal receding Anesthetic complications: no    Last Vitals:  Vitals:   04/02/18 1430 04/02/18 1440  BP: 116/65 111/63  Pulse: 74 63  Resp: 16 18  Temp:  36.8 C  SpO2: 98% 100%    Last Pain:  Vitals:   04/02/18 1440  TempSrc: Oral  PainSc:    Pain Goal: Patients Stated Pain Goal: 8 (04/02/18 1032)               Aziel Morgan A.

## 2018-04-02 NOTE — Anesthesia Preprocedure Evaluation (Addendum)
Anesthesia Evaluation  Patient identified by MRN, date of birth, ID band Patient awake    Reviewed: Allergy & Precautions, NPO status , Patient's Chart, lab work & pertinent test results  Airway Mallampati: II  TM Distance: >3 FB Neck ROM: Full    Dental no notable dental hx. (+) Teeth Intact   Pulmonary neg pulmonary ROS,    Pulmonary exam normal breath sounds clear to auscultation       Cardiovascular hypertension, Normal cardiovascular exam Rhythm:Regular Rate:Normal  Hx/o cHTN- not currently on Rx   Neuro/Psych  Headaches, PSYCHIATRIC DISORDERS Anxiety Depression    GI/Hepatic Neg liver ROS, GERD  ,  Endo/Other  Morbid obesity  Renal/GU negative Renal ROS  negative genitourinary   Musculoskeletal negative musculoskeletal ROS (+)   Abdominal (+) + obese,   Peds  Hematology negative hematology ROS (+)   Anesthesia Other Findings   Reproductive/Obstetrics (+) Pregnancy Previous C/Section                            Anesthesia Physical Anesthesia Plan  ASA: III  Anesthesia Plan: Spinal   Post-op Pain Management:    Induction:   PONV Risk Score and Plan: 4 or greater and Scopolamine patch - Pre-op, Dexamethasone and Ondansetron  Airway Management Planned: Natural Airway  Additional Equipment:   Intra-op Plan:   Post-operative Plan:   Informed Consent: I have reviewed the patients History and Physical, chart, labs and discussed the procedure including the risks, benefits and alternatives for the proposed anesthesia with the patient or authorized representative who has indicated his/her understanding and acceptance.     Plan Discussed with: CRNA and Surgeon  Anesthesia Plan Comments:        Anesthesia Quick Evaluation

## 2018-04-02 NOTE — H&P (Signed)
Monica [name] is a 22 y.o. female presenting for repeat cesarean section at 39 weeks. OB History    Gravida  2   Para  1   Term  1   Preterm      AB      Living  1     SAB      TAB      Ectopic      Multiple      Live Births  1          Past Medical History:  Diagnosis Date  . Anxiety   . Depression   . Headache   . Hypertension   . Pregnancy induced hypertension   . Vaginal Pap smear, abnormal    Past Surgical History:  Procedure Laterality Date  . arthoscopic knee Right 2013  . CESAREAN SECTION N/A 11/05/2012   Procedure: CESAREAN SECTION;  Surgeon: Antionette Char, MD;  Location: [facility];  Service: Obstetrics;  Laterality: N/A;  . COLPOSCOPY  05/07/2016  . knee srthoscopy N/A    Family History: family history includes Asthma in her brother; Healthy in her brother and sister; Hearing loss in her maternal grandmother; Heart attack in her maternal grandmother; Hypertension in her maternal grandmother; Intellectual disability in her maternal aunt; Kidney disease in her maternal grandmother. Social History:  reports that she has never smoked. She has never used smokeless tobacco. She reports that she drank alcohol. She reports that she does not use drugs.     Maternal Diabetes: No Genetic Screening: Normal Maternal Ultrasounds/Referrals: Normal Fetal Ultrasounds or other Referrals:  None Maternal Substance Abuse:  No Significant Maternal Medications:  None Significant Maternal Lab Results:  None Other Comments:  None  Review of Systems  Constitutional: Negative.   Respiratory: Negative.   Cardiovascular: Negative.   Gastrointestinal: Negative.   Genitourinary: Negative.   Musculoskeletal: Negative.   Neurological: Negative.   Psychiatric/Behavioral: Negative.    History   Blood pressure 119/69, pulse 93, temperature (!) 97.4 F (36.3 C), temperature source Oral, resp. rate 18, height 5\' 1"  (1.549 m), weight 116.1 kg, last menstrual period  07/03/2017. Exam Physical Exam  Vitals reviewed. Constitutional: She is oriented to person, place, and time. She appears well-developed and well-nourished. No distress.  HENT:  Head: Normocephalic and atraumatic.  Eyes: Conjunctivae are normal.  Neck: Neck supple. No thyromegaly present.  Cardiovascular: Normal rate and regular rhythm.  Respiratory: Effort normal and breath sounds normal.  GI: Soft. There is no tenderness. There is no rebound and no guarding.  Musculoskeletal: Normal range of motion. She exhibits no tenderness.  Neurological: She is alert and oriented to person, place, and time.  Skin: Skin is warm and dry.  Psychiatric: She has a normal mood and affect.    Vitals:   04/01/18 1033 04/02/18 0956 04/02/18 1032  BP:  119/69   Pulse:  93   Resp:  18   Temp:  (!) 97.4 F (36.3 C)   TempSrc:  Oral Oral  Weight: 116.1 kg 116.1 kg 116.1 kg  Height: 5\' 1"  (1.549 m) 5\' 1"  (1.549 m)     Prenatal labs: ABO, Rh: O/Positive/-- (04/23 1131) Antibody: Negative (04/23 1131) Rubella: 3.71 (04/23 1131) RPR: Non Reactive (08/20 1106)  HBsAg: Negative (04/23 1131)  HIV: Non Reactive (08/20 1106)  GBS: Negative (10/22 1128)   Assessment/Plan: 22 yo female g2P1001 at 39 weeks for elective rpt c/s 1.  CHTN well controlled 2.  Ancef for Abx  The risks of  cesarean section discussed with the patient included but were not limited to: bleeding which may require transfusion or reoperation; infection which may require antibiotics; injury to bowel, bladder, ureters or other surrounding organs; injury to the fetus; need for additional procedures including hysterectomy in the event of a life-threatening hemorrhage; placental abnormalities wth subsequent pregnancies, incisional problems, thromboembolic phenomenon and other postoperative/anesthesia complications. The patient concurred with the proposed plan, giving informed written consent for the procedure.     Monica [name] 04/02/2018,  11:48 AM

## 2018-04-02 NOTE — Anesthesia Procedure Notes (Signed)
Spinal  Patient location during procedure: OR Start time: 04/02/2018 12:03 PM End time: 04/02/2018 12:07 PM Staffing Anesthesiologist: Mal Amabile, MD Performed: anesthesiologist  Preanesthetic Checklist Completed: patient identified, site marked, surgical consent, pre-op evaluation, timeout performed, IV checked, risks and benefits discussed and monitors and equipment checked Spinal Block Patient position: sitting Prep: site prepped and draped and DuraPrep Patient monitoring: heart rate, cardiac monitor, continuous pulse ox and blood pressure Approach: midline Location: L3-4 Injection technique: single-shot Needle Needle type: Pencan  Needle gauge: 24 G Needle length: 9 cm Needle insertion depth: 8 cm Assessment Sensory level: T3 Additional Notes Patient tolerated procedure well. Adequate sensory level.

## 2018-04-02 NOTE — Transfer of Care (Signed)
Immediate Anesthesia Transfer of Care Note  Patient: Monica [name]  Procedure(s) Performed: CESAREAN SECTION (N/A )  Patient Location: PACU  Anesthesia Type:Spinal  Level of Consciousness: awake  Airway & Oxygen Therapy: Patient Spontanous Breathing  Post-op Assessment: Report given to RN and Post -op Vital signs reviewed and stable  Post vital signs: stable  Last Vitals:  Vitals Value Taken Time  BP 105/58 04/02/2018  1:33 PM  Temp    Pulse 84 04/02/2018  1:35 PM  Resp 13 04/02/2018  1:35 PM  SpO2 100 % 04/02/2018  1:35 PM  Vitals shown include unvalidated device data.  Last Pain:  Vitals:   04/02/18 1032  TempSrc: Oral  PainSc: 0-No pain      Patients Stated Pain Goal: 8 (04/02/18 1032)  Complications: No apparent anesthesia complications

## 2018-04-02 NOTE — Progress Notes (Signed)
MOB was referred for history of depression/anxiety. * Referral screened out by Clinical Social Worker because none of the following criteria appear to apply: ~ History of anxiety/depression during this pregnancy, or of post-partum depression following prior delivery. ~ Diagnosis of anxiety and/or depression within last 3 years OR * MOB's symptoms currently being treated with medication and/or therapy. Please contact the Clinical Social Worker if needs arise, by MOB request, or if MOB scores greater than 9/yes to question 10 on Edinburgh Postpartum Depression Screen.  Gisselle Galvis, LCSWA Clinical Social Worker [facility] Cell#: [phone]            

## 2018-04-03 LAB — CREATININE, SERUM
Creatinine, Ser: 0.52 mg/dL (ref 0.44–1.00)
GFR calc Af Amer: >60 mL/min (ref >60)
GFR calc non Af Amer: >60 mL/min (ref >60)

## 2018-04-03 LAB — CBC
HCT: 35.2 % — ABNORMAL LOW (ref 36.0–46.0)
Hemoglobin: 11.2 g/dL — ABNORMAL LOW (ref 12.0–15.0)
MCH: 24 pg — ABNORMAL LOW (ref 26.0–34.0)
MCHC: 31.8 g/dL (ref 30.0–36.0)
MCV: 75.4 fL — ABNORMAL LOW (ref 80.0–100.0)
Platelets: 328 10*3/uL (ref 150–400)
RBC: 4.67 MIL/uL (ref 3.87–5.11)
RDW: 15.2 % (ref 11.5–15.5)
WBC: 16.9 10*3/uL — ABNORMAL HIGH (ref 4.0–10.5)
nRBC: 0 % (ref 0.0–0.2)

## 2018-04-03 LAB — RPR: RPR Ser Ql: NONREACTIVE

## 2018-04-03 NOTE — Progress Notes (Signed)
POSTPARTUM PROGRESS NOTE  POD #1  Subjective:  Monica [name] is a 22 y.o. L2G4010 s/p rLTCS at [redacted]w[redacted]d.  She reports she doing well. No acute events overnight. She denies any problems with ambulating or po intake. Had catheter taken out 1 hour ago, has not urinated yet. Denies nausea or vomiting. She has passed flatus. Pain is well controlled.  Lochia is mild.  Objective: Blood pressure 127/76, pulse 60, temperature 98.6 F (37 C), temperature source Oral, resp. rate 18, height 5\' 1"  (1.549 m), weight 116.1 kg, last menstrual period 07/03/2017, SpO2 97 %, unknown if currently breastfeeding.  Physical Exam:  General: alert, cooperative and no distress Chest: no respiratory distress Heart:regular rate, distal pulses intact Abdomen: soft, nontender,  Uterine Fundus: firm, appropriately tender DVT Evaluation: No calf swelling or tenderness Extremities: minimal edema Skin: warm, dry; incision clean/dry/intact w/ pressure dressing in place   Recent Labs    04/02/18 1025 04/03/18 0528  HGB 12.1 11.2*  HCT 38.5 35.2*    Assessment/Plan: Monica [name] is a 22 y.o. [id number] s/p rLTCS at [redacted]w[redacted]d.  POD#1 - Doing welll; pain controlled. H/H appropriate  Routine postpartum care  OOB, ambulated  Lovenox for VTE prophylaxis Microcytic Anemia: asymptomatic, hgb 11.2    Contraception: Vasectomy  Feeding: Bottlefeeding    Dispo: Plan for discharge POD#2 or 3.   LOS: 1 day   Leticia Penna, D.O. Family Medicine PGY-1  04/03/2018, 6:09 AM

## 2018-04-03 NOTE — Progress Notes (Signed)
Dr Aneta Mins notified of honey comb being saturated after RN removed outer pressure drsg.  Pt did not want to take shower to remove pressure drsg/so RN removed it   Have encouraged mom to walk in halls but she has not attempted

## 2018-04-04 MED ORDER — IBUPROFEN 600 MG PO TABS: 600.0000 mg | ORAL_TABLET | Freq: Four times a day (QID) | ORAL | 0 refills | Status: DC

## 2018-04-04 MED ORDER — OXYCODONE-ACETAMINOPHEN 5-325 MG PO TABS: 1.0000 | ORAL_TABLET | ORAL | 0 refills | Status: DC | PRN

## 2018-04-04 NOTE — Discharge Summary (Addendum)
Postpartum Discharge Summary     Patient Name: Monica [name] DOB: May 31, 1995 MRN: [id number]  Date of admission: 04/02/2018 Delivering Provider: Lesly Dukes   Date of discharge: 04/04/2018  Admitting diagnosis: RCS Intrauterine pregnancy: [redacted]w[redacted]d     Secondary diagnosis:  Active Problems:   History of C-section   Hypertension   Obesity in pregnancy   H/O pre-eclampsia in prior pregnancy, currently pregnant   Status post cesarean delivery  Additional problems: none     Discharge diagnosis: Term Pregnancy Delivered and CHTN                                                                                                Post partum procedures:none  Augmentation: n/a  Complications: None  Hospital course:  Sceduled C/S   22 y.o. yo G2P2002 at [redacted]w[redacted]d was admitted to the hospital 04/02/2018 for scheduled cesarean section with the following indication:Elective Repeat.  Membrane Rupture Time/Date: 12:32 PM ,04/02/2018   Patient delivered a Viable infant.04/02/2018  Details of operation can be found in separate operative note.  Pateint had an uncomplicated postpartum course.  She is ambulating, tolerating a regular diet, passing flatus, and urinating well. Patient is discharged home in stable condition on  04/04/18         Magnesium Sulfate recieved: No BMZ received: No  Physical exam  Vitals:   04/03/18 0920 04/03/18 1330 04/03/18 2218 04/04/18 0740  BP: 109/72 124/71 119/66 119/65  Pulse: 68 66 60 75  Resp: 20 18 18 16   Temp: 97.6 F (36.4 C) 98.4 F (36.9 C) 97.6 F (36.4 C) 98.3 F (36.8 C)  TempSrc:   Oral Oral  SpO2: 96% 96%    Weight:      Height:       General: alert, cooperative and no distress Lochia: appropriate Uterine Fundus: firm Incision: Healing well with no significant drainage, No significant erythema, Dressing is clean, dry, and intact DVT Evaluation: No evidence of DVT seen on physical exam. Negative Homan's sign. No cords or calf tenderness. No  significant calf/ankle edema. Labs: Lab Results  Component Value Date   WBC 16.9 (H) 04/03/2018   HGB 11.2 (L) 04/03/2018   HCT 35.2 (L) 04/03/2018   MCV 75.4 (L) 04/03/2018   PLT 328 04/03/2018   CMP Latest Ref Rng & Units 04/03/2018  Glucose 65 - 99 mg/dL -  BUN 6 - 20 mg/dL -  Creatinine 4.09 - 8.11 mg/dL 9.14  Sodium 782 - 956 mmol/L -  Potassium 3.5 - 5.2 mmol/L -  Chloride 96 - 106 mmol/L -  CO2 20 - 29 mmol/L -  Calcium 8.7 - 10.2 mg/dL -  Total Protein 6.0 - 8.5 g/dL -  Total Bilirubin 0.0 - 1.2 mg/dL -  Alkaline Phos 39 - 213 IU/L -  AST 0 - 40 IU/L -  ALT 0 - 32 IU/L -    Discharge instruction: per After Visit Summary and "Baby and Me Booklet".  After visit meds:  Allergies as of 04/04/2018      Reactions   Iodine    Shellfish Allergy Hives,  Rash      Medication List    STOP taking these medications   aspirin 81 MG chewable tablet     TAKE these medications   cetirizine 10 MG tablet Commonly known as:  ZYRTEC Take 10 mg by mouth daily.   ibuprofen 600 MG tablet Commonly known as:  ADVIL,MOTRIN Take 1 tablet (600 mg total) by mouth every 6 (six) hours.   LUBRICANT EYE DROPS 0.4-0.3 % Soln Generic drug:  Polyethyl Glycol-Propyl Glycol Place 1 drop into both eyes 3 (three) times daily as needed (for dry/irritated eyes.).   oxyCODONE-acetaminophen 5-325 MG tablet Commonly known as:  PERCOCET/ROXICET Take 1 tablet by mouth every 4 (four) hours as needed for severe pain.   PROVIDA OB 20-20-1.25 MG Caps Take 1 tablet by mouth daily.       Diet: routine diet  Activity: Advance as tolerated. Pelvic rest for 6 weeks.   Outpatient follow up:2 weeks and 6 weeks Follow up Appt:No future appointments. Follow up Visit: Follow-up Information    [facility]. Schedule an appointment as soon as possible for a visit in 2 week(s).   Why:  for incision and mood check Contact information: [address] [place]  [address] [phone]         Please schedule this patient for Postpartum visit in: 6 weeks with the following provider: Any provider For C/S patients schedule nurse incision check in weeks 2 weeks: yes High risk pregnancy complicated by: HTN Delivery mode:  CS Anticipated Birth Control:  other/unsure PP Procedures needed: BP check  Schedule Integrated BH visit: yes      Newborn Data: Live born female  Birth Weight: 9 lb 15.1 oz (4510 g) APGAR: 9, 10  Newborn Delivery   Birth date/time:  04/02/2018 12:34:00 Delivery type:  C-Section, Low Transverse Trial of labor:  No C-section categorization:  Repeat     Baby Feeding: Bottle Disposition:home with mother   04/04/2018 Donette Larry, CNM

## 2018-04-05 NOTE — Op Note (Signed)
Monica [name] PROCEDURE DATE: 04/02/2018  PREOPERATIVE DIAGNOSIS: Intrauterine pregnancy at  [redacted]w[redacted]d weeks gestation with LGA and elective repeat cesarean section  POSTOPERATIVE DIAGNOSIS: The same  PROCEDURE:    Low Transverse Cesarean Section  SURGEON:  Dr. Elsie Lincoln  ASSISTANT: Dr. Roxanna Mew  INDICATIONS: Monica [name] is a 22 y.o. [id number] at [redacted]w[redacted]d with LGA and elective repeat.  The risks of cesarean section discussed with the patient included but were not limited to: bleeding which may require transfusion or reoperation; infection which may require antibiotics; injury to bowel, bladder, ureters or other surrounding organs; injury to the fetus; need for additional procedures including hysterectomy in the event of a life-threatening hemorrhage; placental abnormalities wth subsequent pregnancies, incisional problems, thromboembolic phenomenon and other postoperative/anesthesia complications. The patient concurred with the proposed plan, giving informed written consent for the procedure.    FINDINGS:  Viable female  infant in cephalic presentation, 9,10 Apgars, weight to be determined in 1 hour, clear amniotic fluid.  Intact placenta, three vessel cord.  Grossly normal uterus, ovaries and fallopian tubes. .   ANESTHESIA:    Spinal  ESTIMATED BLOOD LOSS: see RN record (233 ml calculated by staff)  SPECIMENS: None  COMPLICATIONS: None immediate  PROCEDURE IN DETAIL:  The patient received intravenous antibiotics and had sequential compression devices applied to her lower extremities.  Spinal anesthesia was administered and was found to be adequate. She was then placed in a dorsal supine position with a leftward tilt, and prepped and draped in a sterile manner.  A foley catheter was placed into her bladder and attached to constant gravity.  After an adequate timeout was performed, a Pfannenstiel skin incision was made with scalpel and carried through to the underlying layer of fascia. The fascia  was incised in the midline and this incision was extended bilaterally using the Mayo scissors. Kocher clamps were applied to the superior aspect of the fascial incision and the underlying rectus muscles were dissected off bluntly. A similar process was carried out on the inferior aspect of the facial incision. The rectus muscles were separated in the midline bluntly and the peritoneum was entered bluntly.   A transverse hysterotomy was made with a scalpel and extended bilaterally bluntly. The bladder blade was then removed. The infant was successfully delivered, and cord was clamped and cut and infant was handed over to awaiting neonatology team. Uterine massage was then administered and the placenta delivered intact with three-vessel cord. The uterus was cleared of clot and debris.  The hysterotomy was closed with 0 vicryl.  A second imbricating suture of 0-Vicryl was used to reinforce the incision and aid in hemostasis.  The peritoneum and rectus muscles were noted to be hemostatic the peritoneum was closed with 0 Vicryl..  The fascia was closed with 0-Vicryl in a running fashion with good restoration of anatomy.  The subcutaneus tissue was copiously irrigated closed with 2-0 plain.  The skin was closed with 4-0 Vicryl in a subcuticular fashion.    Pt tolerated the procedure will.  All counts were correct x2.  Pt went to the recovery room in stable condition.

## 2018-04-05 NOTE — Brief Op Note (Signed)
04/02/2018  11:53 AM  PATIENT:  Monica [name]  22 y.o. female  PRE-OPERATIVE DIAGNOSIS:  RCS  POST-OPERATIVE DIAGNOSIS:  RCS  PROCEDURE:  Procedure(s): CESAREAN SECTION (N/A)  SURGEON:  Surgeon(s) and Role:    * Leggett, Fredrich Romans, MD - Primary    * Arvilla Market, DO - Assisting  PHYSICIAN ASSISTANT:   ASSISTANTS: Dr. Valaria Good   ANESTHESIA:   spinal  EBL:  233 mL   BLOOD ADMINISTERED:none  DRAINS: none   LOCAL MEDICATIONS USED:  NONE  SPECIMEN:  No Specimen  DISPOSITION OF SPECIMEN:  N/A  COUNTS:  YES  TOURNIQUET:  * No tourniquets in log *  DICTATION: .Note written in EPIC  PLAN OF CARE: Admit to inpatient   PATIENT DISPOSITION:  PACU - hemodynamically stable.   Delay start of Pharmacological VTE agent (>24hrs) due to surgical blood loss or risk of bleeding: no

## 2018-04-06 ENCOUNTER — Encounter (HOSPITAL_COMMUNITY): Payer: Self-pay

## 2018-04-06 ENCOUNTER — Ambulatory Visit (HOSPITAL_COMMUNITY)
Admission: RE | Admit: 2018-04-06 | Discharge: 2018-04-06 | Disposition: A | Discharge status: Home / Self Care | Payer: Medicaid Other | Source: Ambulatory Visit | Attending: Obstetrics | Admitting: Obstetrics

## 2018-04-19 ENCOUNTER — Ambulatory Visit (INDEPENDENT_AMBULATORY_CARE_PROVIDER_SITE_OTHER): Payer: Medicaid Other | Admitting: Obstetrics

## 2018-04-19 ENCOUNTER — Encounter: Payer: Self-pay | Admitting: Obstetrics

## 2018-04-19 DIAGNOSIS — G510 Bell's palsy: Secondary | ICD-10-CM

## 2018-04-19 MED ORDER — METHYLPREDNISOLONE 4 MG PO TBPK: ORAL_TABLET | ORAL | 0 refills | Status: DC

## 2018-04-19 NOTE — Progress Notes (Signed)
Pt presents for 2 week incision check. Pt states that the area is red, but no other issues.

## 2018-04-19 NOTE — Progress Notes (Signed)
Subjective:     Monica [name] is a 22 y.o. female who presents for a postpartum visit. She is 2 weeks postpartum following a low cervical transverse Cesarean section. I have fully reviewed the prenatal and intrapartum course. The delivery was at 39 gestational weeks. Outcome: repeat cesarean section, low transverse incision. Anesthesia: spinal. Postpartum course has been normal. Baby's course has been normal. Baby is feeding by breast. Bleeding thin lochia. Bowel function is normal. Bladder function is normal. Patient is not sexually active. Contraception method is abstinence. Postpartum depression screening: negative.  Tobacco, alcohol and substance abuse history reviewed.  Adult immunizations reviewed including TDAP, rubella and varicella.  The following portions of the patient's history were reviewed and updated as appropriate: allergies, current medications, past family history, past medical history, past social history, past surgical history and problem list.  Review of Systems A comprehensive review of systems was negative.   Objective:    BP 126/79   Pulse 78   Ht 5\' 1"  (1.549 m)   Wt 229 lb 6.4 oz (104.1 kg)   LMP 07/03/2017 (Exact Date)   Breastfeeding? Yes   BMI 43.34 kg/m   General:  alert and no distress    Breasts:  inspection negative, no nipple discharge or bleeding, no masses or nodularity palpable  Lungs: clear to auscultation bilaterally  Heart:  regular rate and rhythm, S1, S2 normal, no murmur, click, rub or gallop  Abdomen: soft, non-tender; bowel sounds normal; no masses,  no organomegaly                HEENT:  FACE:  Right-sided drooping of lips, cheek and eyelid   50% of 15 min visit spent on counseling and coordination of care.   Assessment:     1. Postpartum care following cesarean delivery - doing well  2. Right-sided Bell's palsy Rx: - methylPREDNISolone (MEDROL DOSEPAK) 4 MG TBPK tablet; Take as directed.  Dispense: 21 tablet; Refill: 0  Plan:    1.  Contraception: abstinence 2. Continue PNV's 3. Follow up in: 3 weeks or as needed.    Brock BadHARLES A. HARPER MD 04-19-2018

## 2018-05-10 ENCOUNTER — Ambulatory Visit: Payer: Medicaid Other | Admitting: Obstetrics

## 2018-05-27 ENCOUNTER — Ambulatory Visit: Payer: Medicaid Other | Admitting: Obstetrics

## 2018-06-24 ENCOUNTER — Ambulatory Visit: Payer: Medicaid Other | Admitting: Obstetrics

## 2018-06-28 ENCOUNTER — Encounter: Payer: Self-pay | Admitting: Obstetrics

## 2018-12-24 ENCOUNTER — Other Ambulatory Visit: Payer: Self-pay

## 2018-12-24 DIAGNOSIS — R6889 Other general symptoms and signs: Secondary | ICD-10-CM | POA: Diagnosis not present

## 2018-12-24 DIAGNOSIS — Z20822 Contact with and (suspected) exposure to covid-19: Secondary | ICD-10-CM

## 2018-12-26 LAB — NOVEL CORONAVIRUS, NAA: SARS-CoV-2, NAA: NOT DETECTED

## 2019-02-07 ENCOUNTER — Other Ambulatory Visit: Payer: Self-pay

## 2019-02-07 DIAGNOSIS — R6889 Other general symptoms and signs: Secondary | ICD-10-CM | POA: Diagnosis not present

## 2019-02-07 DIAGNOSIS — Z20822 Contact with and (suspected) exposure to covid-19: Secondary | ICD-10-CM

## 2019-02-08 LAB — NOVEL CORONAVIRUS, NAA: SARS-CoV-2, NAA: NOT DETECTED

## 2019-03-24 DIAGNOSIS — H52223 Regular astigmatism, bilateral: Secondary | ICD-10-CM | POA: Diagnosis not present

## 2019-03-24 DIAGNOSIS — H5213 Myopia, bilateral: Secondary | ICD-10-CM | POA: Diagnosis not present

## 2019-03-29 DIAGNOSIS — H5213 Myopia, bilateral: Secondary | ICD-10-CM | POA: Diagnosis not present

## 2019-04-26 DIAGNOSIS — H5213 Myopia, bilateral: Secondary | ICD-10-CM | POA: Diagnosis not present

## 2019-08-03 ENCOUNTER — Other Ambulatory Visit: Payer: Self-pay

## 2019-08-03 ENCOUNTER — Ambulatory Visit (INDEPENDENT_AMBULATORY_CARE_PROVIDER_SITE_OTHER): Payer: Medicaid Other

## 2019-08-03 VITALS — Wt 229.0 lb

## 2019-08-03 DIAGNOSIS — O0990 Supervision of high risk pregnancy, unspecified, unspecified trimester: Secondary | ICD-10-CM | POA: Insufficient documentation

## 2019-08-03 DIAGNOSIS — Z3201 Encounter for pregnancy test, result positive: Secondary | ICD-10-CM | POA: Diagnosis not present

## 2019-08-03 DIAGNOSIS — Z3A08 8 weeks gestation of pregnancy: Secondary | ICD-10-CM

## 2019-08-03 DIAGNOSIS — Z3200 Encounter for pregnancy test, result unknown: Secondary | ICD-10-CM

## 2019-08-03 DIAGNOSIS — Z3480 Encounter for supervision of other normal pregnancy, unspecified trimester: Secondary | ICD-10-CM

## 2019-08-03 LAB — POCT URINE PREGNANCY: Preg Test, Ur: POSITIVE — AB

## 2019-08-03 NOTE — Progress Notes (Signed)
Agree with A & P. 

## 2019-08-03 NOTE — Progress Notes (Signed)
Monica [name] is here for a UPT.  UPT positive. LM 06/07/19 leaving her around 8 weeks 1 day. Pt reports nausea but no vomiting. She is unsure if she wants to continue care at [facility] or [facility].  OB History    Gravida  3   Para  2   Term  2   Preterm      AB      Living  2     SAB      TAB      Ectopic      Multiple  0   Live Births  2          Assessment   Normal pregnancy  Plan   Keep f/u for new ob visit

## 2019-09-08 ENCOUNTER — Other Ambulatory Visit: Payer: Self-pay

## 2019-09-08 ENCOUNTER — Encounter: Payer: Self-pay | Admitting: Obstetrics and Gynecology

## 2019-09-08 ENCOUNTER — Ambulatory Visit (INDEPENDENT_AMBULATORY_CARE_PROVIDER_SITE_OTHER): Payer: Medicaid Other | Admitting: Obstetrics and Gynecology

## 2019-09-08 ENCOUNTER — Other Ambulatory Visit (HOSPITAL_COMMUNITY)
Admission: RE | Admit: 2019-09-08 | Discharge: 2019-09-08 | Disposition: A | Discharge status: Home / Self Care | Payer: Medicaid Other | Source: Ambulatory Visit | Attending: Obstetrics and Gynecology | Admitting: Obstetrics and Gynecology

## 2019-09-08 VITALS — BP 112/77 | HR 86 | Wt 228.3 lb

## 2019-09-08 DIAGNOSIS — Z3481 Encounter for supervision of other normal pregnancy, first trimester: Secondary | ICD-10-CM | POA: Diagnosis not present

## 2019-09-08 DIAGNOSIS — O0990 Supervision of high risk pregnancy, unspecified, unspecified trimester: Secondary | ICD-10-CM

## 2019-09-08 DIAGNOSIS — O10919 Unspecified pre-existing hypertension complicating pregnancy, unspecified trimester: Secondary | ICD-10-CM

## 2019-09-08 DIAGNOSIS — O99210 Obesity complicating pregnancy, unspecified trimester: Secondary | ICD-10-CM

## 2019-09-08 DIAGNOSIS — Z98891 History of uterine scar from previous surgery: Secondary | ICD-10-CM

## 2019-09-08 DIAGNOSIS — O09299 Supervision of pregnancy with other poor reproductive or obstetric history, unspecified trimester: Secondary | ICD-10-CM

## 2019-09-08 DIAGNOSIS — Z3482 Encounter for supervision of other normal pregnancy, second trimester: Secondary | ICD-10-CM | POA: Diagnosis not present

## 2019-09-08 MED ORDER — VITAFOL GUMMIES 3.33-0.333-34.8 MG PO CHEW: 2.0000 | CHEWABLE_TABLET | Freq: Every day | ORAL | 6 refills | Status: AC

## 2019-09-08 MED ORDER — ASPIRIN EC 81 MG PO TBEC: 81.0000 mg | DELAYED_RELEASE_TABLET | Freq: Every day | ORAL | 2 refills | Status: DC

## 2019-09-08 MED ORDER — BLOOD PRESSURE MONITOR KIT: 1.0000 | PACK | 0 refills | Status: DC

## 2019-09-08 NOTE — Addendum Note (Signed)
Addended by: Dalphine Handing on: 09/08/2019 02:54 PM   Modules accepted: Orders

## 2019-09-08 NOTE — Patient Instructions (Signed)
 First Trimester of Pregnancy The first trimester of pregnancy is from week 1 until the end of week 13 (months 1 through 3). A week after a sperm fertilizes an egg, the egg will implant on the wall of the uterus. This embryo will begin to develop into a baby. Genes from you and your partner will form the baby. The female genes will determine whether the baby will be a boy or a girl. At 6-8 weeks, the eyes and face will be formed, and the heartbeat can be seen on ultrasound. At the end of 12 weeks, all the baby's organs will be formed. Now that you are pregnant, you will want to do everything you can to have a healthy baby. Two of the most important things are to get good prenatal care and to follow your health care provider's instructions. Prenatal care is all the medical care you receive before the baby's birth. This care will help prevent, find, and treat any problems during the pregnancy and childbirth. Body changes during your first trimester Your body goes through many changes during pregnancy. The changes vary from woman to woman.  You may gain or lose a couple of pounds at first.  You may feel sick to your stomach (nauseous) and you may throw up (vomit). If the vomiting is uncontrollable, call your health care provider.  You may tire easily.  You may develop headaches that can be relieved by medicines. All medicines should be approved by your health care provider.  You may urinate more often. Painful urination may mean you have a bladder infection.  You may develop heartburn as a result of your pregnancy.  You may develop constipation because certain hormones are causing the muscles that push stool through your intestines to slow down.  You may develop hemorrhoids or swollen veins (varicose veins).  Your breasts may begin to grow larger and become tender. Your nipples may stick out more, and the tissue that surrounds them (areola) may become darker.  Your gums may bleed and may be  sensitive to brushing and flossing.  Dark spots or blotches (chloasma, mask of pregnancy) may develop on your face. This will likely fade after the baby is born.  Your menstrual periods will stop.  You may have a loss of appetite.  You may develop cravings for certain kinds of food.  You may have changes in your emotions from day to day, such as being excited to be pregnant or being concerned that something may go wrong with the pregnancy and baby.  You may have more vivid and strange dreams.  You may have changes in your hair. These can include thickening of your hair, rapid growth, and changes in texture. Some women also have hair loss during or after pregnancy, or hair that feels dry or thin. Your hair will most likely return to normal after your baby is born. What to expect at prenatal visits During a routine prenatal visit:  You will be weighed to make sure you and the baby are growing normally.  Your blood pressure will be taken.  Your abdomen will be measured to track your baby's growth.  The fetal heartbeat will be listened to between weeks 10 and 14 of your pregnancy.  Test results from any previous visits will be discussed. Your health care provider may ask you:  How you are feeling.  If you are feeling the baby move.  If you have had any abnormal symptoms, such as leaking fluid, bleeding, severe headaches, or   abdominal cramping.  If you are using any tobacco products, including cigarettes, chewing tobacco, and electronic cigarettes.  If you have any questions. Other tests that may be performed during your first trimester include:  Blood tests to find your blood type and to check for the presence of any previous infections. The tests will also be used to check for low iron levels (anemia) and protein on red blood cells (Rh antibodies). Depending on your risk factors, or if you previously had diabetes during pregnancy, you may have tests to check for high blood sugar  that affects pregnant women (gestational diabetes).  Urine tests to check for infections, diabetes, or protein in the urine.  An ultrasound to confirm the proper growth and development of the baby.  Fetal screens for spinal cord problems (spina bifida) and Down syndrome.  HIV (human immunodeficiency virus) testing. Routine prenatal testing includes screening for HIV, unless you choose not to have this test.  You may need other tests to make sure you and the baby are doing well. Follow these instructions at home: Medicines  Follow your health care provider's instructions regarding medicine use. Specific medicines may be either safe or unsafe to take during pregnancy.  Take a prenatal vitamin that contains at least 600 micrograms (mcg) of folic acid.  If you develop constipation, try taking a stool softener if your health care provider approves. Eating and drinking   Eat a balanced diet that includes fresh fruits and vegetables, whole grains, good sources of protein such as meat, eggs, or tofu, and low-fat dairy. Your health care provider will help you determine the amount of weight gain that is right for you.  Avoid raw meat and uncooked cheese. These carry germs that can cause birth defects in the baby.  Eating four or five small meals rather than three large meals a day may help relieve nausea and vomiting. If you start to feel nauseous, eating a few soda crackers can be helpful. Drinking liquids between meals, instead of during meals, also seems to help ease nausea and vomiting.  Limit foods that are high in fat and processed sugars, such as fried and sweet foods.  To prevent constipation: ? Eat foods that are high in fiber, such as fresh fruits and vegetables, whole grains, and beans. ? Drink enough fluid to keep your urine clear or pale yellow. Activity  Exercise only as directed by your health care provider. Most women can continue their usual exercise routine during  pregnancy. Try to exercise for 30 minutes at least 5 days a week. Exercising will help you: ? Control your weight. ? Stay in shape. ? Be prepared for labor and delivery.  Experiencing pain or cramping in the lower abdomen or lower back is a good sign that you should stop exercising. Check with your health care provider before continuing with normal exercises.  Try to avoid standing for long periods of time. Move your legs often if you must stand in one place for a long time.  Avoid heavy lifting.  Wear low-heeled shoes and practice good posture.  You may continue to have sex unless your health care provider tells you not to. Relieving pain and discomfort  Wear a good support bra to relieve breast tenderness.  Take warm sitz baths to soothe any pain or discomfort caused by hemorrhoids. Use hemorrhoid cream if your health care provider approves.  Rest with your legs elevated if you have leg cramps or low back pain.  If you develop varicose veins   in your legs, wear support hose. Elevate your feet for 15 minutes, 3-4 times a day. Limit salt in your diet. Prenatal care  Schedule your prenatal visits by the twelfth week of pregnancy. They are usually scheduled monthly at first, then more often in the last 2 months before delivery.  Write down your questions. Take them to your prenatal visits.  Keep all your prenatal visits as told by your health care provider. This is important. Safety  Wear your seat belt at all times when driving.  Make a list of emergency phone numbers, including numbers for family, friends, the hospital, and police and fire departments. General instructions  Ask your health care provider for a referral to a local prenatal education class. Begin classes no later than the beginning of month 6 of your pregnancy.  Ask for help if you have counseling or nutritional needs during pregnancy. Your health care provider can offer advice or refer you to specialists for help  with various needs.  Do not use hot tubs, steam rooms, or saunas.  Do not douche or use tampons or scented sanitary pads.  Do not cross your legs for long periods of time.  Avoid cat litter boxes and soil used by cats. These carry germs that can cause birth defects in the baby and possibly loss of the fetus by miscarriage or stillbirth.  Avoid all smoking, herbs, alcohol, and medicines not prescribed by your health care provider. Chemicals in these products affect the formation and growth of the baby.  Do not use any products that contain nicotine or tobacco, such as cigarettes and e-cigarettes. If you need help quitting, ask your health care provider. You may receive counseling support and other resources to help you quit.  Schedule a dentist appointment. At home, brush your teeth with a soft toothbrush and be gentle when you floss. Contact a health care provider if:  You have dizziness.  You have mild pelvic cramps, pelvic pressure, or nagging pain in the abdominal area.  You have persistent nausea, vomiting, or diarrhea.  You have a bad smelling vaginal discharge.  You have pain when you urinate.  You notice increased swelling in your face, hands, legs, or ankles.  You are exposed to fifth disease or chickenpox.  You are exposed to German measles (rubella) and have never had it. Get help right away if:  You have a fever.  You are leaking fluid from your vagina.  You have spotting or bleeding from your vagina.  You have severe abdominal cramping or pain.  You have rapid weight gain or loss.  You vomit blood or material that looks like coffee grounds.  You develop a severe headache.  You have shortness of breath.  You have any kind of trauma, such as from a fall or a car accident. Summary  The first trimester of pregnancy is from week 1 until the end of week 13 (months 1 through 3).  Your body goes through many changes during pregnancy. The changes vary from  woman to woman.  You will have routine prenatal visits. During those visits, your health care provider will examine you, discuss any test results you may have, and talk with you about how you are feeling. This information is not intended to replace advice given to you by your health care provider. Make sure you discuss any questions you have with your health care provider. Document Revised: 04/24/2017 Document Reviewed: 04/23/2016 [employer] Patient Education  2020 [employer]   Second Trimester of   Pregnancy The second trimester is from week 14 through week 27 (months 4 through 6). The second trimester is often a time when you feel your best. Your body has adjusted to being pregnant, and you begin to feel better physically. Usually, morning sickness has lessened or quit completely, you may have more energy, and you may have an increase in appetite. The second trimester is also a time when the fetus is growing rapidly. At the end of the sixth month, the fetus is about 9 inches long and weighs about 1 pounds. You will likely begin to feel the baby move (quickening) between 16 and 20 weeks of pregnancy. Body changes during your second trimester Your body continues to go through many changes during your second trimester. The changes vary from woman to woman.  Your weight will continue to increase. You will notice your lower abdomen bulging out.  You may begin to get stretch marks on your hips, abdomen, and breasts.  You may develop headaches that can be relieved by medicines. The medicines should be approved by your health care provider.  You may urinate more often because the fetus is pressing on your bladder.  You may develop or continue to have heartburn as a result of your pregnancy.  You may develop constipation because certain hormones are causing the muscles that push waste through your intestines to slow down.  You may develop hemorrhoids or swollen, bulging veins (varicose  veins).  You may have back pain. This is caused by: ? Weight gain. ? Pregnancy hormones that are relaxing the joints in your pelvis. ? A shift in weight and the muscles that support your balance.  Your breasts will continue to grow and they will continue to become tender.  Your gums may bleed and may be sensitive to brushing and flossing.  Dark spots or blotches (chloasma, mask of pregnancy) may develop on your face. This will likely fade after the baby is born.  A dark line from your belly button to the pubic area (linea nigra) may appear. This will likely fade after the baby is born.  You may have changes in your hair. These can include thickening of your hair, rapid growth, and changes in texture. Some women also have hair loss during or after pregnancy, or hair that feels dry or thin. Your hair will most likely return to normal after your baby is born. What to expect at prenatal visits During a routine prenatal visit:  You will be weighed to make sure you and the fetus are growing normally.  Your blood pressure will be taken.  Your abdomen will be measured to track your baby's growth.  The fetal heartbeat will be listened to.  Any test results from the previous visit will be discussed. Your health care provider may ask you:  How you are feeling.  If you are feeling the baby move.  If you have had any abnormal symptoms, such as leaking fluid, bleeding, severe headaches, or abdominal cramping.  If you are using any tobacco products, including cigarettes, chewing tobacco, and electronic cigarettes.  If you have any questions. Other tests that may be performed during your second trimester include:  Blood tests that check for: ? Low iron levels (anemia). ? High blood sugar that affects pregnant women (gestational diabetes) between 24 and 28 weeks. ? Rh antibodies. This is to check for a protein on red blood cells (Rh factor).  Urine tests to check for infections,  diabetes, or protein in the urine.    An ultrasound to confirm the proper growth and development of the baby.  An amniocentesis to check for possible genetic problems.  Fetal screens for spina bifida and Down syndrome.  HIV (human immunodeficiency virus) testing. Routine prenatal testing includes screening for HIV, unless you choose not to have this test. Follow these instructions at home: Medicines  Follow your health care provider's instructions regarding medicine use. Specific medicines may be either safe or unsafe to take during pregnancy.  Take a prenatal vitamin that contains at least 600 micrograms (mcg) of folic acid.  If you develop constipation, try taking a stool softener if your health care provider approves. Eating and drinking   Eat a balanced diet that includes fresh fruits and vegetables, whole grains, good sources of protein such as meat, eggs, or tofu, and low-fat dairy. Your health care provider will help you determine the amount of weight gain that is right for you.  Avoid raw meat and uncooked cheese. These carry germs that can cause birth defects in the baby.  If you have low calcium intake from food, talk to your health care provider about whether you should take a daily calcium supplement.  Limit foods that are high in fat and processed sugars, such as fried and sweet foods.  To prevent constipation: ? Drink enough fluid to keep your urine clear or pale yellow. ? Eat foods that are high in fiber, such as fresh fruits and vegetables, whole grains, and beans. Activity  Exercise only as directed by your health care provider. Most women can continue their usual exercise routine during pregnancy. Try to exercise for 30 minutes at least 5 days a week. Stop exercising if you experience uterine contractions.  Avoid heavy lifting, wear low heel shoes, and practice good posture.  A sexual relationship may be continued unless your health care provider directs you  otherwise. Relieving pain and discomfort  Wear a good support bra to prevent discomfort from breast tenderness.  Take warm sitz baths to soothe any pain or discomfort caused by hemorrhoids. Use hemorrhoid cream if your health care provider approves.  Rest with your legs elevated if you have leg cramps or low back pain.  If you develop varicose veins, wear support hose. Elevate your feet for 15 minutes, 3-4 times a day. Limit salt in your diet. Prenatal Care  Write down your questions. Take them to your prenatal visits.  Keep all your prenatal visits as told by your health care provider. This is important. Safety  Wear your seat belt at all times when driving.  Make a list of emergency phone numbers, including numbers for family, friends, the hospital, and police and fire departments. General instructions  Ask your health care provider for a referral to a local prenatal education class. Begin classes no later than the beginning of month 6 of your pregnancy.  Ask for help if you have counseling or nutritional needs during pregnancy. Your health care provider can offer advice or refer you to specialists for help with various needs.  Do not use hot tubs, steam rooms, or saunas.  Do not douche or use tampons or scented sanitary pads.  Do not cross your legs for long periods of time.  Avoid cat litter boxes and soil used by cats. These carry germs that can cause birth defects in the baby and possibly loss of the fetus by miscarriage or stillbirth.  Avoid all smoking, herbs, alcohol, and unprescribed drugs. Chemicals in these products can affect the formation and growth of   the baby.  Do not use any products that contain nicotine or tobacco, such as cigarettes and e-cigarettes. If you need help quitting, ask your health care provider.  Visit your dentist if you have not gone yet during your pregnancy. Use a soft toothbrush to brush your teeth and be gentle when you floss. Contact a  health care provider if:  You have dizziness.  You have mild pelvic cramps, pelvic pressure, or nagging pain in the abdominal area.  You have persistent nausea, vomiting, or diarrhea.  You have a bad smelling vaginal discharge.  You have pain when you urinate. Get help right away if:  You have a fever.  You are leaking fluid from your vagina.  You have spotting or bleeding from your vagina.  You have severe abdominal cramping or pain.  You have rapid weight gain or weight loss.  You have shortness of breath with chest pain.  You notice sudden or extreme swelling of your face, hands, ankles, feet, or legs.  You have not felt your baby move in over an hour.  You have severe headaches that do not go away when you take medicine.  You have vision changes. Summary  The second trimester is from week 14 through week 27 (months 4 through 6). It is also a time when the fetus is growing rapidly.  Your body goes through many changes during pregnancy. The changes vary from woman to woman.  Avoid all smoking, herbs, alcohol, and unprescribed drugs. These chemicals affect the formation and growth your baby.  Do not use any tobacco products, such as cigarettes, chewing tobacco, and e-cigarettes. If you need help quitting, ask your health care provider.  Contact your health care provider if you have any questions. Keep all prenatal visits as told by your health care provider. This is important. This information is not intended to replace advice given to you by your health care provider. Make sure you discuss any questions you have with your health care provider. Document Revised: 09/03/2018 Document Reviewed: 06/17/2016 [employer] Patient Education  2020 [employer]   Contraception Choices Contraception, also called birth control, refers to methods or devices that prevent pregnancy. Hormonal methods Contraceptive implant  A contraceptive implant is a thin, plastic tube that  contains a hormone. It is inserted into the upper part of the arm. It can remain in place for up to 3 years. Progestin-only injections Progestin-only injections are injections of progestin, a synthetic form of the hormone progesterone. They are given every 3 months by a health care provider. Birth control pills  Birth control pills are pills that contain hormones that prevent pregnancy. They must be taken once a day, preferably at the same time each day. Birth control patch  The birth control patch contains hormones that prevent pregnancy. It is placed on the skin and must be changed once a week for three weeks and removed on the fourth week. A prescription is needed to use this method of contraception. Vaginal ring  A vaginal ring contains hormones that prevent pregnancy. It is placed in the vagina for three weeks and removed on the fourth week. After that, the process is repeated with a new ring. A prescription is needed to use this method of contraception. Emergency contraceptive Emergency contraceptives prevent pregnancy after unprotected sex. They come in pill form and can be taken up to 5 days after sex. They work best the sooner they are taken after having sex. Most emergency contraceptives are available without a prescription. This   method should not be used as your only form of birth control. Barrier methods Female condom  A female condom is a thin sheath that is worn over the penis during sex. Condoms keep sperm from going inside a woman's body. They can be used with a spermicide to increase their effectiveness. They should be disposed after a single use. Female condom  A female condom is a soft, loose-fitting sheath that is put into the vagina before sex. The condom keeps sperm from going inside a woman's body. They should be disposed after a single use. Diaphragm  A diaphragm is a soft, dome-shaped barrier. It is inserted into the vagina before sex, along with a spermicide. The  diaphragm blocks sperm from entering the uterus, and the spermicide kills sperm. A diaphragm should be left in the vagina for 6-8 hours after sex and removed within 24 hours. A diaphragm is prescribed and fitted by a health care provider. A diaphragm should be replaced every 1-2 years, after giving birth, after gaining more than 15 lb (6.8 kg), and after pelvic surgery. Cervical cap  A cervical cap is a round, soft latex or plastic cup that fits over the cervix. It is inserted into the vagina before sex, along with spermicide. It blocks sperm from entering the uterus. The cap should be left in place for 6-8 hours after sex and removed within 48 hours. A cervical cap must be prescribed and fitted by a health care provider. It should be replaced every 2 years. Sponge  A sponge is a soft, circular piece of polyurethane foam with spermicide on it. The sponge helps block sperm from entering the uterus, and the spermicide kills sperm. To use it, you make it wet and then insert it into the vagina. It should be inserted before sex, left in for at least 6 hours after sex, and removed and thrown away within 30 hours. Spermicides Spermicides are chemicals that kill or block sperm from entering the cervix and uterus. They can come as a cream, jelly, suppository, foam, or tablet. A spermicide should be inserted into the vagina with an applicator at least 10-15 minutes before sex to allow time for it to work. The process must be repeated every time you have sex. Spermicides do not require a prescription. Intrauterine contraception Intrauterine device (IUD) An IUD is a T-shaped device that is put in a woman's uterus. There are two types:  Hormone IUD.This type contains progestin, a synthetic form of the hormone progesterone. This type can stay in place for 3-5 years.  Copper IUD.This type is wrapped in copper wire. It can stay in place for 10 years.  Permanent methods of contraception Female tubal ligation In  this method, a woman's fallopian tubes are sealed, tied, or blocked during surgery to prevent eggs from traveling to the uterus. Hysteroscopic sterilization In this method, a small, flexible insert is placed into each fallopian tube. The inserts cause scar tissue to form in the fallopian tubes and block them, so sperm cannot reach an egg. The procedure takes about 3 months to be effective. Another form of birth control must be used during those 3 months. Female sterilization This is a procedure to tie off the tubes that carry sperm (vasectomy). After the procedure, the man can still ejaculate fluid (semen). Natural planning methods Natural family planning In this method, a couple does not have sex on days when the woman could become pregnant. Calendar method This means keeping track of the length of each menstrual cycle,   identifying the days when pregnancy can happen, and not having sex on those days. Ovulation method In this method, a couple avoids sex during ovulation. Symptothermal method This method involves not having sex during ovulation. The woman typically checks for ovulation by watching changes in her temperature and in the consistency of cervical mucus. Post-ovulation method In this method, a couple waits to have sex until after ovulation. Summary  Contraception, also called birth control, means methods or devices that prevent pregnancy.  Hormonal methods of contraception include implants, injections, pills, patches, vaginal rings, and emergency contraceptives.  Barrier methods of contraception can include female condoms, female condoms, diaphragms, cervical caps, sponges, and spermicides.  There are two types of IUDs (intrauterine devices). An IUD can be put in a woman's uterus to prevent pregnancy for 3-5 years.  Permanent sterilization can be done through a procedure for males, females, or both.  Natural family planning methods involve not having sex on days when the woman could  become pregnant. This information is not intended to replace advice given to you by your health care provider. Make sure you discuss any questions you have with your health care provider. Document Revised: 05/14/2017 Document Reviewed: 06/14/2016 Elsevier Patient Education  2020 Elsevier Inc.   Breastfeeding  Choosing to breastfeed is one of the best decisions you can make for yourself and your baby. A change in hormones during pregnancy causes your breasts to make breast milk in your milk-producing glands. Hormones prevent breast milk from being released before your baby is born. They also prompt milk flow after birth. Once breastfeeding has begun, thoughts of your baby, as well as his or her sucking or crying, can stimulate the release of milk from your milk-producing glands. Benefits of breastfeeding Research shows that breastfeeding offers many health benefits for infants and mothers. It also offers a cost-free and convenient way to feed your baby. For your baby  Your first milk (colostrum) helps your baby's digestive system to function better.  Special cells in your milk (antibodies) help your baby to fight off infections.  Breastfed babies are less likely to develop asthma, allergies, obesity, or type 2 diabetes. They are also at lower risk for sudden infant death syndrome (SIDS).  Nutrients in breast milk are better able to meet your baby's needs compared to infant formula.  Breast milk improves your baby's brain development. For you  Breastfeeding helps to create a very special bond between you and your baby.  Breastfeeding is convenient. Breast milk costs nothing and is always available at the correct temperature.  Breastfeeding helps to burn calories. It helps you to lose the weight that you gained during pregnancy.  Breastfeeding makes your uterus return faster to its size before pregnancy. It also slows bleeding (lochia) after you give birth.  Breastfeeding helps to lower  your risk of developing type 2 diabetes, osteoporosis, rheumatoid arthritis, cardiovascular disease, and breast, ovarian, uterine, and endometrial cancer later in life. Breastfeeding basics Starting breastfeeding  Find a comfortable place to sit or lie down, with your neck and back well-supported.  Place a pillow or a rolled-up blanket under your baby to bring him or her to the level of your breast (if you are seated). Nursing pillows are specially designed to help support your arms and your baby while you breastfeed.  Make sure that your baby's tummy (abdomen) is facing your abdomen.  Gently massage your breast. With your fingertips, massage from the outer edges of your breast inward toward the nipple. This encourages   milk flow. If your milk flows slowly, you may need to continue this action during the feeding.  Support your breast with 4 fingers underneath and your thumb above your nipple (make the letter "C" with your hand). Make sure your fingers are well away from your nipple and your baby's mouth.  Stroke your baby's lips gently with your finger or nipple.  When your baby's mouth is open wide enough, quickly bring your baby to your breast, placing your entire nipple and as much of the areola as possible into your baby's mouth. The areola is the colored area around your nipple. ? More areola should be visible above your baby's upper lip than below the lower lip. ? Your baby's lips should be opened and extended outward (flanged) to ensure an adequate, comfortable latch. ? Your baby's tongue should be between his or her lower gum and your breast.  Make sure that your baby's mouth is correctly positioned around your nipple (latched). Your baby's lips should create a seal on your breast and be turned out (everted).  It is common for your baby to suck about 2-3 minutes in order to start the flow of breast milk. Latching Teaching your baby how to latch onto your breast properly is very  important. An improper latch can cause nipple pain, decreased milk supply, and poor weight gain in your baby. Also, if your baby is not latched onto your nipple properly, he or she may swallow some air during feeding. This can make your baby fussy. Burping your baby when you switch breasts during the feeding can help to get rid of the air. However, teaching your baby to latch on properly is still the best way to prevent fussiness from swallowing air while breastfeeding. Signs that your baby has successfully latched onto your nipple  Silent tugging or silent sucking, without causing you pain. Infant's lips should be extended outward (flanged).  Swallowing heard between every 3-4 sucks once your milk has started to flow (after your let-down milk reflex occurs).  Muscle movement above and in front of his or her ears while sucking. Signs that your baby has not successfully latched onto your nipple  Sucking sounds or smacking sounds from your baby while breastfeeding.  Nipple pain. If you think your baby has not latched on correctly, slip your finger into the corner of your baby's mouth to break the suction and place it between your baby's gums. Attempt to start breastfeeding again. Signs of successful breastfeeding Signs from your baby  Your baby will gradually decrease the number of sucks or will completely stop sucking.  Your baby will fall asleep.  Your baby's body will relax.  Your baby will retain a small amount of milk in his or her mouth.  Your baby will let go of your breast by himself or herself. Signs from you  Breasts that have increased in firmness, weight, and size 1-3 hours after feeding.  Breasts that are softer immediately after breastfeeding.  Increased milk volume, as well as a change in milk consistency and color by the fifth day of breastfeeding.  Nipples that are not sore, cracked, or bleeding. Signs that your baby is getting enough milk  Wetting at least 1-2  diapers during the first 24 hours after birth.  Wetting at least 5-6 diapers every 24 hours for the first week after birth. The urine should be clear or pale yellow by the age of 5 days.  Wetting 6-8 diapers every 24 hours as your baby continues   to grow and develop.  At least 3 stools in a 24-hour period by the age of 5 days. The stool should be soft and yellow.  At least 3 stools in a 24-hour period by the age of 7 days. The stool should be seedy and yellow.  No loss of weight greater than 10% of birth weight during the first 3 days of life.  Average weight gain of 4-7 oz (113-198 g) per week after the age of 4 days.  Consistent daily weight gain by the age of 5 days, without weight loss after the age of 2 weeks. After a feeding, your baby may spit up a small amount of milk. This is normal. Breastfeeding frequency and duration Frequent feeding will help you make more milk and can prevent sore nipples and extremely full breasts (breast engorgement). Breastfeed when you feel the need to reduce the fullness of your breasts or when your baby shows signs of hunger. This is called "breastfeeding on demand." Signs that your baby is hungry include:  Increased alertness, activity, or restlessness.  Movement of the head from side to side.  Opening of the mouth when the corner of the mouth or cheek is stroked (rooting).  Increased sucking sounds, smacking lips, cooing, sighing, or squeaking.  Hand-to-mouth movements and sucking on fingers or hands.  Fussing or crying. Avoid introducing a pacifier to your baby in the first 4-6 weeks after your baby is born. After this time, you may choose to use a pacifier. Research has shown that pacifier use during the first year of a baby's life decreases the risk of sudden infant death syndrome (SIDS). Allow your baby to feed on each breast as long as he or she wants. When your baby unlatches or falls asleep while feeding from the first breast, offer the  second breast. Because newborns are often sleepy in the first few weeks of life, you may need to awaken your baby to get him or her to feed. Breastfeeding times will vary from baby to baby. However, the following rules can serve as a guide to help you make sure that your baby is properly fed:  Newborns (babies 4 weeks of age or younger) may breastfeed every 1-3 hours.  Newborns should not go without breastfeeding for longer than 3 hours during the day or 5 hours during the night.  You should breastfeed your baby a minimum of 8 times in a 24-hour period. Breast milk pumping     Pumping and storing breast milk allows you to make sure that your baby is exclusively fed your breast milk, even at times when you are unable to breastfeed. This is especially important if you go back to work while you are still breastfeeding, or if you are not able to be present during feedings. Your lactation consultant can help you find a method of pumping that works best for you and give you guidelines about how long it is safe to store breast milk. Caring for your breasts while you breastfeed Nipples can become dry, cracked, and sore while breastfeeding. The following recommendations can help keep your breasts moisturized and healthy:  Avoid using soap on your nipples.  Wear a supportive bra designed especially for nursing. Avoid wearing underwire-style bras or extremely tight bras (sports bras).  Air-dry your nipples for 3-4 minutes after each feeding.  Use only cotton bra pads to absorb leaked breast milk. Leaking of breast milk between feedings is normal.  Use lanolin on your nipples after breastfeeding. Lanolin helps   to maintain your skin's normal moisture barrier. Pure lanolin is not harmful (not toxic) to your baby. You may also hand express a few drops of breast milk and gently massage that milk into your nipples and allow the milk to air-dry. In the first few weeks after giving birth, some women experience  breast engorgement. Engorgement can make your breasts feel heavy, warm, and tender to the touch. Engorgement peaks within 3-5 days after you give birth. The following recommendations can help to ease engorgement:  Completely empty your breasts while breastfeeding or pumping. You may want to start by applying warm, moist heat (in the shower or with warm, water-soaked hand towels) just before feeding or pumping. This increases circulation and helps the milk flow. If your baby does not completely empty your breasts while breastfeeding, pump any extra milk after he or she is finished.  Apply ice packs to your breasts immediately after breastfeeding or pumping, unless this is too uncomfortable for you. To do this: ? Put ice in a plastic bag. ? Place a towel between your skin and the bag. ? Leave the ice on for 20 minutes, 2-3 times a day.  Make sure that your baby is latched on and positioned properly while breastfeeding. If engorgement persists after 48 hours of following these recommendations, contact your health care provider or a lactation consultant. Overall health care recommendations while breastfeeding  Eat 3 healthy meals and 3 snacks every day. Well-nourished mothers who are breastfeeding need an additional 450-500 calories a day. You can meet this requirement by increasing the amount of a balanced diet that you eat.  Drink enough water to keep your urine pale yellow or clear.  Rest often, relax, and continue to take your prenatal vitamins to prevent fatigue, stress, and low vitamin and mineral levels in your body (nutrient deficiencies).  Do not use any products that contain nicotine or tobacco, such as cigarettes and e-cigarettes. Your baby may be harmed by chemicals from cigarettes that pass into breast milk and exposure to secondhand smoke. If you need help quitting, ask your health care provider.  Avoid alcohol.  Do not use illegal drugs or marijuana.  Talk with your health care  provider before taking any medicines. These include over-the-counter and prescription medicines as well as vitamins and herbal supplements. Some medicines that may be harmful to your baby can pass through breast milk.  It is possible to become pregnant while breastfeeding. If birth control is desired, ask your health care provider about options that will be safe while breastfeeding your baby. Where to find more information: [employer]: www.llli.org Contact a health care provider if:  You feel like you want to stop breastfeeding or have become frustrated with breastfeeding.  Your nipples are cracked or bleeding.  Your breasts are red, tender, or warm.  You have: ? Painful breasts or nipples. ? A swollen area on either breast. ? A fever or chills. ? Nausea or vomiting. ? Drainage other than breast milk from your nipples.  Your breasts do not become full before feedings by the fifth day after you give birth.  You feel sad and depressed.  Your baby is: ? Too sleepy to eat well. ? Having trouble sleeping. ? More than 1 week old and wetting fewer than 6 diapers in a 24-hour period. ? Not gaining weight by 5 days of age.  Your baby has fewer than 3 stools in a 24-hour period.  Your baby's skin or the white parts of   his or her eyes become yellow. Get help right away if:  Your baby is overly tired (lethargic) and does not want to wake up and feed.  Your baby develops an unexplained fever. Summary  Breastfeeding offers many health benefits for infant and mothers.  Try to breastfeed your infant when he or she shows early signs of hunger.  Gently tickle or stroke your baby's lips with your finger or nipple to allow the baby to open his or her mouth. Bring the baby to your breast. Make sure that much of the areola is in your baby's mouth. Offer one side and burp the baby before you offer the other side.  Talk with your health care provider or lactation consultant  if you have questions or you face problems as you breastfeed. This information is not intended to replace advice given to you by your health care provider. Make sure you discuss any questions you have with your health care provider. Document Revised: 08/06/2017 Document Reviewed: 06/13/2016 Elsevier Patient Education  2020 Elsevier Inc.  

## 2019-09-08 NOTE — Progress Notes (Signed)
Subjective:    Monica [name] is a G3P2002 [redacted]w[redacted]d being seen today for her first obstetrical visit.  Her obstetrical history is significant for obesity, pre-eclampsia and previous c-section x 2. Patient does intend to breast feed. Pregnancy history fully reviewed.  Patient reports no complaints.  Vitals:   09/08/19 0925  BP: 112/77  Pulse: 86  Weight: 228 lb 4.8 oz (103.6 kg)    HISTORY: OB History  Gravida Para Term Preterm AB Living  3 2 2     2   SAB TAB Ectopic Multiple Live Births        0 2    # Outcome Date GA Lbr Len/2nd Weight Sex Delivery Anes PTL Lv  3 Current           2 Term 04/02/18 [redacted]w[redacted]d  9 lb 15.1 oz (4.51 kg) M CS-LTranv Spinal  LIV     Birth Comments: Term female infant, appears LGA  1 Term 11/05/12 [redacted]w[redacted]d  7 lb 15.5 oz (3.615 kg) F CS-LTranv Spinal  LIV     Birth Comments: preeclampsia pulmonary edema     Complications: Failure to Progress in First Stage, Preeclampsia   Past Medical History:  Diagnosis Date  . Anxiety   . Depression   . Headache   . Hypertension   . Pregnancy induced hypertension   . Vaginal Pap smear, abnormal    Past Surgical History:  Procedure Laterality Date  . arthoscopic knee Right 2013  . CESAREAN SECTION N/A 11/05/2012   Procedure: CESAREAN SECTION;  Surgeon: 11/07/2012, MD;  Location: [facility];  Service: Obstetrics;  Laterality: N/A;  . CESAREAN SECTION N/A 04/02/2018   Procedure: CESAREAN SECTION;  Surgeon: 13/12/2017, MD;  Location: [facility] BIRTHING SU[facility];  Service: Obstetrics;  Laterality: N/A;  . COLPOSCOPY  05/07/2016  . knee srthoscopy N/A    Family History  Problem Relation Age of Onset  . Heart attack Maternal Grandmother   . Hypertension Maternal Grandmother   . Hearing loss Maternal Grandmother   . Kidney disease Maternal Grandmother   . Healthy Brother        x1  . Asthma Brother   . Healthy Sister        x1`  . Intellectual disability Maternal Aunt   . Cancer Neg Hx   . Diabetes Neg Hx       Exam    Uterus:     Pelvic Exam:    Perineum: Normal Perineum   Vulva: normal   Vagina:  normal mucosa, normal discharge   pH:    Cervix: multiparous appearance and cervix is closed and long   Adnexa: no mass, fullness, tenderness   Bony Pelvis: gynecoid  System: Breast:  normal appearance, no masses or tenderness   Skin: normal coloration and turgor, no rashes    Neurologic: oriented, no focal deficits   Extremities: normal strength, tone, and muscle mass   HEENT extra ocular movement intact   Mouth/Teeth mucous membranes moist, pharynx normal without lesions and dental hygiene good   Neck supple and no masses   Cardiovascular: regular rate and rhythm   Respiratory:  appears well, vitals normal, no respiratory distress, acyanotic, normal RR, chest clear, no wheezing, crepitations, rhonchi, normal symmetric air entry   Abdomen: soft, non-tender; bowel sounds normal; no masses,  no organomegaly   Urinary:       Assessment:    Pregnancy: 05/09/2016 Patient Active Problem List   Diagnosis Date Noted  . Supervision of high risk  pregnancy, antepartum 08/03/2019  . Status post cesarean delivery 04/02/2018  . Chronic hypertension affecting pregnancy 12/08/2017  . H/O pre-eclampsia in prior pregnancy, currently pregnant 11/10/2017  . History of C-section 09/15/2017  . Hypertension 09/15/2017  . Obesity in pregnancy 09/15/2017        Plan:     Initial labs drawn. Prenatal vitamins. Problem list reviewed and updated. Genetic Screening discussed : panorama ordered.  Ultrasound discussed; fetal survey: ordered. Normotensive without medication Rx ASA ordered Baseline labs ordered Patient with previous c-section x 2 and was counseled on repeat c-section delivery  Follow up in 4 weeks. 50% of 30 min visit spent on counseling and coordination of care.     [name] 09/08/2019

## 2019-09-08 NOTE — Progress Notes (Signed)
Pt presents for NOB visit  Normal pap 09/15/17

## 2019-09-09 LAB — CERVICOVAGINAL ANCILLARY ONLY
Bacterial Vaginitis (gardnerella): NEGATIVE
Candida Glabrata: NEGATIVE
Candida Vaginitis: NEGATIVE
Chlamydia: NEGATIVE
Comment: NEGATIVE
Comment: NEGATIVE
Comment: NEGATIVE
Comment: NEGATIVE
Comment: NEGATIVE
Comment: NORMAL
Neisseria Gonorrhea: NEGATIVE
Trichomonas: NEGATIVE

## 2019-09-09 LAB — COMPREHENSIVE METABOLIC PANEL
ALT: 9 IU/L (ref 0–32)
AST: 14 IU/L (ref 0–40)
Albumin/Globulin Ratio: 1.3 (ref 1.2–2.2)
Albumin: 3.8 g/dL — ABNORMAL LOW (ref 3.9–5.0)
Alkaline Phosphatase: 50 IU/L (ref 39–117)
BUN/Creatinine Ratio: 7 — ABNORMAL LOW (ref 9–23)
BUN: 3 mg/dL — ABNORMAL LOW (ref 6–20)
Bilirubin Total: 0.3 mg/dL (ref 0.0–1.2)
CO2: 22 mmol/L (ref 20–29)
Calcium: 9.2 mg/dL (ref 8.7–10.2)
Chloride: 104 mmol/L (ref 96–106)
Creatinine, Ser: 0.46 mg/dL — ABNORMAL LOW (ref 0.57–1.00)
GFR calc Af Amer: 161 mL/min/{1.73_m2} (ref >59)
GFR calc non Af Amer: 140 mL/min/{1.73_m2} (ref >59)
Globulin, Total: 2.9 g/dL (ref 1.5–4.5)
Glucose: 90 mg/dL (ref 65–99)
Potassium: 3.7 mmol/L (ref 3.5–5.2)
Sodium: 138 mmol/L (ref 134–144)
Total Protein: 6.7 g/dL (ref 6.0–8.5)

## 2019-09-09 LAB — PROTEIN / CREATININE RATIO, URINE
Creatinine, Urine: 51.6 mg/dL
Protein, Ur: 21.5 mg/dL
Protein/Creat Ratio: 417 mg/g creat — ABNORMAL HIGH (ref 0–200)

## 2019-09-09 LAB — OBSTETRIC PANEL, INCLUDING HIV
Antibody Screen: NEGATIVE
Basophils Absolute: 0 10*3/uL (ref 0.0–0.2)
Basos: 0 %
EOS (ABSOLUTE): 0.1 10*3/uL (ref 0.0–0.4)
Eos: 1 %
HIV Screen 4th Generation wRfx: NONREACTIVE
Hematocrit: 37.1 % (ref 34.0–46.6)
Hemoglobin: 12.2 g/dL (ref 11.1–15.9)
Hepatitis B Surface Ag: NEGATIVE
Immature Grans (Abs): 0 10*3/uL (ref 0.0–0.1)
Immature Granulocytes: 0 %
Lymphocytes Absolute: 1.7 10*3/uL (ref 0.7–3.1)
Lymphs: 22 %
MCH: 25.4 pg — ABNORMAL LOW (ref 26.6–33.0)
MCHC: 32.9 g/dL (ref 31.5–35.7)
MCV: 77 fL — ABNORMAL LOW (ref 79–97)
Monocytes Absolute: 0.5 10*3/uL (ref 0.1–0.9)
Monocytes: 7 %
Neutrophils Absolute: 5.3 10*3/uL (ref 1.4–7.0)
Neutrophils: 70 %
Platelets: 332 10*3/uL (ref 150–450)
RBC: 4.8 x10E6/uL (ref 3.77–5.28)
RDW: 14.1 % (ref 11.7–15.4)
RPR Ser Ql: NONREACTIVE
Rh Factor: POSITIVE
Rubella Antibodies, IGG: 4.2 index (ref >0.99)
WBC: 7.7 10*3/uL (ref 3.4–10.8)

## 2019-09-09 LAB — HEMOGLOBIN A1C
Est. average glucose Bld gHb Est-mCnc: 103 mg/dL
Hgb A1c MFr Bld: 5.2 % (ref 4.8–5.6)

## 2019-09-09 LAB — CYTOLOGY - PAP: Diagnosis: NEGATIVE

## 2019-09-09 LAB — HEPATITIS C ANTIBODY: Hep C Virus Ab: <0.1 s/co ratio (ref 0.0–0.9)

## 2019-09-10 LAB — CULTURE, OB URINE

## 2019-09-10 LAB — URINE CULTURE, OB REFLEX

## 2019-09-15 ENCOUNTER — Encounter: Payer: Self-pay | Admitting: Obstetrics and Gynecology

## 2019-10-06 ENCOUNTER — Telehealth (INDEPENDENT_AMBULATORY_CARE_PROVIDER_SITE_OTHER): Payer: Medicaid Other | Admitting: Obstetrics and Gynecology

## 2019-10-06 ENCOUNTER — Encounter: Payer: Self-pay | Admitting: Obstetrics and Gynecology

## 2019-10-06 DIAGNOSIS — O99212 Obesity complicating pregnancy, second trimester: Secondary | ICD-10-CM

## 2019-10-06 DIAGNOSIS — O09292 Supervision of pregnancy with other poor reproductive or obstetric history, second trimester: Secondary | ICD-10-CM

## 2019-10-06 DIAGNOSIS — O34218 Maternal care for other type scar from previous cesarean delivery: Secondary | ICD-10-CM

## 2019-10-06 DIAGNOSIS — O10919 Unspecified pre-existing hypertension complicating pregnancy, unspecified trimester: Secondary | ICD-10-CM

## 2019-10-06 DIAGNOSIS — O1210 Gestational proteinuria, unspecified trimester: Secondary | ICD-10-CM | POA: Insufficient documentation

## 2019-10-06 DIAGNOSIS — Z3A17 17 weeks gestation of pregnancy: Secondary | ICD-10-CM

## 2019-10-06 DIAGNOSIS — O99210 Obesity complicating pregnancy, unspecified trimester: Secondary | ICD-10-CM

## 2019-10-06 DIAGNOSIS — M5430 Sciatica, unspecified side: Secondary | ICD-10-CM

## 2019-10-06 DIAGNOSIS — O10912 Unspecified pre-existing hypertension complicating pregnancy, second trimester: Secondary | ICD-10-CM

## 2019-10-06 DIAGNOSIS — O0990 Supervision of high risk pregnancy, unspecified, unspecified trimester: Secondary | ICD-10-CM

## 2019-10-06 DIAGNOSIS — O1212 Gestational proteinuria, second trimester: Secondary | ICD-10-CM

## 2019-10-06 DIAGNOSIS — Z98891 History of uterine scar from previous surgery: Secondary | ICD-10-CM

## 2019-10-06 DIAGNOSIS — O09299 Supervision of pregnancy with other poor reproductive or obstetric history, unspecified trimester: Secondary | ICD-10-CM

## 2019-10-06 NOTE — Progress Notes (Signed)
   OBSTETRICS PRENATAL VIRTUAL VISIT ENCOUNTER NOTE  Provider location: [facility] for [facility]   I connected with Monica [name] on 10/06/19 at  9:30 AM EDT by MyChart Video Encounter at home and verified that I am speaking with the correct person using two identifiers.   I discussed the limitations, risks, security and privacy concerns of performing an evaluation and management service virtually and the availability of in person appointments. I also discussed with the patient that there may be a patient responsible charge related to this service. The patient expressed understanding and agreed to proceed. Subjective:  Monica [name] is a 24 y.o. G3P2002 at [redacted]w[redacted]d being seen today for ongoing prenatal care.  She is currently monitored for the following issues for this high-risk pregnancy and has History of C-section; Hypertension; Obesity in pregnancy; H/O pre-eclampsia in prior pregnancy, currently pregnant; Chronic hypertension affecting pregnancy; Status post cesarean delivery; Supervision of high risk pregnancy, antepartum; and Proteinuria affecting pregnancy on their problem list.  Patient reports sciatic pain.  Contractions: Not present. Vag. Bleeding: None.  Movement: Present. Denies any leaking of fluid.   The following portions of the patient's history were reviewed and updated as appropriate: allergies, current medications, past family history, past medical history, past social history, past surgical history and problem list.   Objective:  There were no vitals filed for this visit.  Fetal Status:     Movement: Present     General:  Alert, oriented and cooperative. Patient is in no acute distress.  Respiratory: Normal respiratory effort, no problems with respiration noted  Mental Status: Normal mood and affect. Normal behavior. Normal judgment and thought content.  Rest of physical exam deferred due to type of encounter  Imaging: No results found.  Assessment and  Plan:  Pregnancy: G3P2002 at [redacted]w[redacted]d  1. Chronic hypertension affecting pregnancy Not able to take BP, does not have cuff yet  2. H/O pre-eclampsia in prior pregnancy, currently pregnant Cont baby ASA  3. History of C-section Will need repeat CS  4. Supervision of high risk pregnancy, antepartum Referral to PT for sciatic pain  5. Obesity in pregnancy   Preterm labor symptoms and general obstetric precautions including but not limited to vaginal bleeding, contractions, leaking of fluid and fetal movement were reviewed in detail with the patient. I discussed the assessment and treatment plan with the patient. The patient was provided an opportunity to ask questions and all were answered. The patient agreed with the plan and demonstrated an understanding of the instructions. The patient was advised to call back or seek an in-person office evaluation/go to [facility] at [facility] for any urgent or concerning symptoms. Please refer to After Visit Summary for other counseling recommendations.   I provided 12 minutes of face-to-face time during this encounter.  Return in about 4 weeks (around 11/03/2019) for high OB, in person.  Future Appointments  Date Time Provider Department Center  10/06/2019  9:30 AM Conan Bowens, MD [facility] None  10/20/2019 10:30 AM [facility] NURSE [facility]  10/20/2019 10:30 AM [facility] US3 [facility]  10/20/2019 11:45 AM [facility] LAB [facility]    Conan Bowens, MD Center for [facility], [facility]

## 2019-10-06 NOTE — Progress Notes (Signed)
I connected with  Monica [name] on 10/06/19 by a video enabled telemedicine application and verified that I am speaking with the correct person using two identifiers.   I discussed the limitations of evaluation and management by telemedicine. The patient expressed understanding and agreed to proceed.  [facility].  She has not picked up her BP cuff and reports no problems today.

## 2019-10-13 ENCOUNTER — Encounter: Payer: Self-pay | Admitting: Physical Therapy

## 2019-10-13 ENCOUNTER — Other Ambulatory Visit: Payer: Self-pay

## 2019-10-13 ENCOUNTER — Ambulatory Visit: Payer: Medicaid Other | Attending: Obstetrics and Gynecology | Admitting: Physical Therapy

## 2019-10-13 DIAGNOSIS — M5441 Lumbago with sciatica, right side: Secondary | ICD-10-CM | POA: Insufficient documentation

## 2019-10-13 NOTE — Therapy (Signed)
[facility] [address] [place], [place], [address] Phone: [phone]   Fax:  [phone]  Physical Therapy Evaluation  Patient Details  Name: Monica [name] MRN: [id number] Date of Birth: 25-Nov-1995 Referring Provider (PT): Dr. Leroy Libman   Encounter Date: 10/13/2019  PT End of Session - 10/13/19 1003    Visit Number  1    Date for PT Re-Evaluation  01/05/20    Authorization Type  Medicaid    PT Start Time  0930    PT Stop Time  1000    PT Time Calculation (min)  30 min    Activity Tolerance  Patient tolerated treatment well;No increased pain    Behavior During Therapy  WFL for tasks assessed/performed       Past Medical History:  Diagnosis Date  . Anxiety   . Depression   . Headache   . Hypertension   . Pregnancy induced hypertension   . Vaginal Pap smear, abnormal     Past Surgical History:  Procedure Laterality Date  . arthoscopic knee Right 2013  . CESAREAN SECTION N/A 11/05/2012   Procedure: CESAREAN SECTION;  Surgeon: Antionette Char, MD;  Location: [facility];  Service: Obstetrics;  Laterality: N/A;  . CESAREAN SECTION N/A 04/02/2018   Procedure: CESAREAN SECTION;  Surgeon: Lesly Dukes, MD;  Location: [facility];  Service: Obstetrics;  Laterality: N/A;  . COLPOSCOPY  05/07/2016  . knee srthoscopy N/A     There were no vitals filed for this visit.   Subjective Assessment - 10/13/19 0936    Subjective  Patient reports her pain started 2 months ago. Pregnant at [redacted] weeks. Patient reports the pain started with her other 2 pregnancies. Pain starts in the right SI joint into the posterior right side.    Patient Stated Goals  Reduce pain    Currently in Pain?  Yes    Pain Score  9     Pain Location  Sacrum    Pain Orientation  Right    Pain Descriptors / Indicators  Sharp    Pain Type  Acute pain    Pain Radiating Towards  to posterior right thigh    Pain Onset  More than a month ago     Pain Frequency  Intermittent    Aggravating Factors   middle of the day, going from the sitting position to standing, laying on back, turning on side from supine to sidely,    Pain Relieving Factors  when wake up in the morning    Multiple Pain Sites  No         [facility] PT Assessment - 10/13/19 0001      Assessment   Medical Diagnosis  M54.30 Sciatic leg pain    Referring Provider (PT)  Dr. Leroy Libman    Onset Date/Surgical Date  08/13/19    Prior Therapy  none for SI joint      Precautions   Precautions  Other (comment)    Precaution Comments  prenant   due date 03/13/2020     Restrictions   Weight Bearing Restrictions  No      Balance Screen   Has the patient fallen in the past 6 months  No    Has the patient had a decrease in activity level because of a fear of falling?   No    Is the patient reluctant to leave their home because of a fear of falling?   No  Home Environment   Living Environment  Private residence      Prior Function   Level of Independence  Independent    Vocation  Full time employment    Vocation Requirements  sitting     Leisure  none      Cognition   Overall Cognitive Status  Within Functional Limits for tasks assessed      Posture/Postural Control   Posture/Postural Control  Postural limitations    Posture Comments  pregnant      ROM / Strength   AROM / PROM / Strength  AROM;PROM;Strength      AROM   Lumbar - Left Side Bend  full with pain in right SI      Palpation   Spinal mobility  tightness in the thoracolumbar junction    SI assessment   right ilium rotation anteriorly; sacrum rotated left    Palpation comment  tenderness in the right gluteal      Special Tests    Special Tests  Sacrolliac Tests      Pelvic Compression   Findings  Positive    Side  Right    comment  pain with compression in sidely                  Objective measurements completed on examination: See above findings.    Pelvic Floor  Special Questions - 10/13/19 0001    Prior Pregnancies  Yes    Number of Pregnancies  3    Number of C-Sections  2    Currently Sexually Active  Yes    Is this Painful  No    Urinary Leakage  No    Fecal incontinence  No               PT Education - 10/13/19 1003    Education Details  Access Code: [email or web address]    Person(s) Educated  Patient    Methods  Explanation;Demonstration;Verbal cues;Handout    Comprehension  Returned demonstration;Verbalized understanding       PT Short Term Goals - 10/13/19 1009      PT SHORT TERM GOAL #1   Title  independent with initial HEP    Baseline  not educated yet    Time  4    Period  Weeks    Status  New    Target Date  11/10/19        PT Long Term Goals - 10/13/19 1009      PT LONG TERM GOAL #1   Title  independent with HEP    Baseline  not educated yet    Time  12    Period  Weeks    Status  New    Target Date  01/05/20      PT LONG TERM GOAL #2   Title  able to from sit to stand at work with pain level </= 2/10    Baseline  pain level 8/10    Time  12    Period  Weeks    Status  New    Target Date  01/05/20      PT LONG TERM GOAL #3   Title  able to lay on her back and straighten her legs with pain level </= 2/10    Baseline  pain level 8/10    Time  12    Period  Weeks    Status  New    Target Date  01/05/20      PT  LONG TERM GOAL #4   Title  understand different birthing positions to decrease strain on the right SI joint    Time  12    Period  Weeks    Status  New    Target Date  01/05/20             Plan - 10/13/19 1004    Clinical Impression Statement  Patient is a 24 year old female with sudden onset of sciatica on the right that started 2 months ago. Patient pain level is 8/10 when she goes from sitting to standing, laying on back and straighening her knees, and rolling in bed. Patient reports this happens during her last 2 pregnacies. Right ilium is anteriorly rotated and sacrum rotated left.  Tightness in the thoracolumbar junction. Tenderness located in the right SI joint and gluteals. Patient will benefit from skilled therapy to work on spinal mobility and correcting the ilium, while working on SI stability.    Personal Factors and Comorbidities  Comorbidity 1;Profession    Comorbidities  2 C-sections    Examination-Activity Limitations  Sit;Bed Mobility    Stability/Clinical Decision Making  Evolving/Moderate complexity    Clinical Decision Making  Low    Rehab Potential  Excellent    PT Frequency  1x / week    PT Duration  12 weeks    PT Treatment/Interventions  Cryotherapy;Moist Heat;Therapeutic activities;Therapeutic exercise;Neuromuscular re-education;Manual techniques;Patient/family education;Spinal Manipulations    PT Next Visit Plan  check SI alignment, work on thoracic alignment, neural tension stretch, body mechanics with work and at home, different birthing positions    PT Home Exercise Plan  Access Code: [email or web address]    Consulted and Agree with Plan of Care  Patient       Patient will benefit from skilled therapeutic intervention in order to improve the following deficits and impairments:  Increased fascial restricitons, Pain, Decreased strength, Decreased activity tolerance  Visit Diagnosis: Acute right-sided low back pain with right-sided sciatica - Plan: PT plan of care cert/re-cert     Problem List Patient Active Problem List   Diagnosis Date Noted  . Proteinuria affecting pregnancy 10/06/2019  . Supervision of high risk pregnancy, antepartum 08/03/2019  . Status post cesarean delivery 04/02/2018  . Chronic hypertension affecting pregnancy 12/08/2017  . H/O pre-eclampsia in prior pregnancy, currently pregnant 11/10/2017  . History of C-section 09/15/2017  . Hypertension 09/15/2017  . Obesity in pregnancy 09/15/2017    Earlie Counts, PT 10/13/19 10:14 AM   [facility] [address], [place], [place], [address] Phone: [phone]   Fax:  [phone]  Name: Monica [name] MRN: [id number] Date of Birth: [birth year]

## 2019-10-13 NOTE — Patient Instructions (Signed)
Access Code: [email or web address] URL: [email or web address] Date: 10/13/2019 Prepared by: Eulis Foster  Exercises Mid-Thoracic Spine Mobilization with Taped Tennis Balls Shoulder Flexion at Wall - 1 x daily - 7 x weekly - 1 sets - 10 reps Seated Piriformis Stretch with Trunk Bend - 1 x daily - 7 x weekly - 2 reps - 30 sec hold Piriformis Mobilization with Small Ball - 1 x daily - 7 x weekly - 1 sets - 10 reps [facility] [address] [place], [place] [address] Phone # [phone] Fax [phone]

## 2019-10-20 ENCOUNTER — Ambulatory Visit: Payer: Medicaid Other | Admitting: *Deleted

## 2019-10-20 ENCOUNTER — Ambulatory Visit (HOSPITAL_COMMUNITY): Payer: Medicaid Other | Attending: Obstetrics and Gynecology

## 2019-10-20 ENCOUNTER — Other Ambulatory Visit: Payer: Self-pay | Admitting: Obstetrics and Gynecology

## 2019-10-20 ENCOUNTER — Other Ambulatory Visit: Payer: Medicaid Other

## 2019-10-20 ENCOUNTER — Encounter: Payer: Self-pay | Admitting: *Deleted

## 2019-10-20 ENCOUNTER — Other Ambulatory Visit: Payer: Self-pay

## 2019-10-20 VITALS — BP 121/77 | HR 89

## 2019-10-20 DIAGNOSIS — I10 Essential (primary) hypertension: Secondary | ICD-10-CM

## 2019-10-20 DIAGNOSIS — O99212 Obesity complicating pregnancy, second trimester: Secondary | ICD-10-CM

## 2019-10-20 DIAGNOSIS — O34219 Maternal care for unspecified type scar from previous cesarean delivery: Secondary | ICD-10-CM

## 2019-10-20 DIAGNOSIS — O0990 Supervision of high risk pregnancy, unspecified, unspecified trimester: Secondary | ICD-10-CM

## 2019-10-20 DIAGNOSIS — E669 Obesity, unspecified: Secondary | ICD-10-CM

## 2019-10-20 DIAGNOSIS — Z3A19 19 weeks gestation of pregnancy: Secondary | ICD-10-CM

## 2019-10-20 DIAGNOSIS — O10012 Pre-existing essential hypertension complicating pregnancy, second trimester: Secondary | ICD-10-CM | POA: Diagnosis not present

## 2019-10-20 DIAGNOSIS — Z363 Encounter for antenatal screening for malformations: Secondary | ICD-10-CM

## 2019-10-20 DIAGNOSIS — O09292 Supervision of pregnancy with other poor reproductive or obstetric history, second trimester: Secondary | ICD-10-CM | POA: Diagnosis not present

## 2019-10-21 ENCOUNTER — Other Ambulatory Visit: Payer: Self-pay | Admitting: *Deleted

## 2019-10-21 DIAGNOSIS — O10919 Unspecified pre-existing hypertension complicating pregnancy, unspecified trimester: Secondary | ICD-10-CM

## 2019-10-25 ENCOUNTER — Other Ambulatory Visit: Payer: Self-pay | Admitting: *Deleted

## 2019-10-25 DIAGNOSIS — Z362 Encounter for other antenatal screening follow-up: Secondary | ICD-10-CM

## 2019-11-03 ENCOUNTER — Ambulatory Visit (INDEPENDENT_AMBULATORY_CARE_PROVIDER_SITE_OTHER): Payer: Medicaid Other | Admitting: Obstetrics & Gynecology

## 2019-11-03 ENCOUNTER — Other Ambulatory Visit: Payer: Self-pay

## 2019-11-03 ENCOUNTER — Ambulatory Visit: Payer: Medicaid Other | Admitting: Physical Therapy

## 2019-11-03 VITALS — BP 112/74 | HR 101 | Wt 237.9 lb

## 2019-11-03 DIAGNOSIS — O0990 Supervision of high risk pregnancy, unspecified, unspecified trimester: Secondary | ICD-10-CM

## 2019-11-03 DIAGNOSIS — O0992 Supervision of high risk pregnancy, unspecified, second trimester: Secondary | ICD-10-CM

## 2019-11-03 DIAGNOSIS — Z3A21 21 weeks gestation of pregnancy: Secondary | ICD-10-CM

## 2019-11-03 DIAGNOSIS — Z98891 History of uterine scar from previous surgery: Secondary | ICD-10-CM

## 2019-11-03 DIAGNOSIS — O34219 Maternal care for unspecified type scar from previous cesarean delivery: Secondary | ICD-10-CM

## 2019-11-03 NOTE — Progress Notes (Signed)
   PRENATAL VISIT NOTE  Subjective:  Monica [name] is a 24 y.o. G3P2002 at [redacted]w[redacted]d being seen today for ongoing prenatal care.  She is currently monitored for the following issues for this high-risk pregnancy and has History of C-section; Hypertension; Obesity in pregnancy; H/O pre-eclampsia in prior pregnancy, currently pregnant; Chronic hypertension affecting pregnancy; Status post cesarean delivery; Supervision of high risk pregnancy, antepartum; and Proteinuria affecting pregnancy on their problem list.  Patient reports no complaints.  Contractions: Not present. Vag. Bleeding: None.  Movement: Present. Denies leaking of fluid.   The following portions of the patient's history were reviewed and updated as appropriate: allergies, current medications, past family history, past medical history, past social history, past surgical history and problem list.   Objective:   Vitals:   11/03/19 0936  BP: 112/74  Pulse: (!) 101  Weight: 107.9 kg    Fetal Status: Fetal Heart Rate (bpm): 154   Movement: Present     General:  Alert, oriented and cooperative. Patient is in no acute distress.  Skin: Skin is warm and dry. No rash noted.   Cardiovascular: Normal heart rate noted  Respiratory: Normal respiratory effort, no problems with respiration noted  Abdomen: Soft, gravid, appropriate for gestational age.  Pain/Pressure: Absent     Pelvic: Cervical exam deferred        Extremities: Normal range of motion.  Edema: None  Mental Status: Normal mood and affect. Normal behavior. Normal judgment and thought content.   Assessment and Plan:  Pregnancy: G3P2002 at [redacted]w[redacted]d 1. Supervision of high risk pregnancy, antepartum Pt doing well, has repeat U/S for better cardiac views.   2. History of C-section Prev CDx2, RCS at 39 weeks considering BTL   Preterm labor symptoms and general obstetric precautions including but not limited to vaginal bleeding, contractions, leaking of fluid and fetal movement were  reviewed in detail with the patient. Please refer to After Visit Summary for other counseling recommendations.   No follow-ups on file.  Future Appointments  Date Time Provider Department Center  11/03/2019  3:30 PM Desenglau, Shireen Quan, PT [facility]  11/10/2019  2:00 PM Theressa Millard, PT [facility]  11/24/2019  2:00 PM Theressa Millard, PT [facility]  12/01/2019  8:15 AM Adam Phenix, MD [facility] None  12/01/2019 11:15 AM [facility] NURSE [facility]  12/01/2019 11:30 AM [facility] NURSE [facility]  12/01/2019 11:30 AM [facility] US3 [facility]  12/01/2019  2:00 PM Theressa Millard, PT [facility]    Malachy Chamber, MD

## 2019-11-03 NOTE — Progress Notes (Signed)
ROB, reports no problems today. 

## 2019-11-10 ENCOUNTER — Other Ambulatory Visit: Payer: Self-pay

## 2019-11-10 ENCOUNTER — Encounter: Payer: Self-pay | Admitting: Physical Therapy

## 2019-11-10 ENCOUNTER — Ambulatory Visit: Payer: Medicaid Other | Attending: Obstetrics and Gynecology | Admitting: Physical Therapy

## 2019-11-10 DIAGNOSIS — M5441 Lumbago with sciatica, right side: Secondary | ICD-10-CM | POA: Diagnosis not present

## 2019-11-10 NOTE — Therapy (Addendum)
[facility] [address], [place], [place], [address] Phone: [phone]   Fax:  [phone]  Physical Therapy Treatment  Patient Details  Name: Monica [name] MRN: [id number] Date of Birth: 11/18/1995 Referring Provider (PT): Dr. Vivien Rota   Encounter Date: 11/10/2019   PT End of Session - 11/10/19 1439    Visit Number 2    Date for PT Re-Evaluation 01/05/20    Authorization Type Medicaid    Authorization Time Period 11/03/2019-11/23/2019    Authorization - Visit Number 1    Authorization - Number of Visits 3    PT Start Time 1400    PT Stop Time 0254    PT Time Calculation (min) 38 min    Activity Tolerance Patient tolerated treatment well;No increased pain    Behavior During Therapy WFL for tasks assessed/performed           Past Medical History:  Diagnosis Date  . Anxiety   . Depression   . Headache   . Hypertension   . Pregnancy induced hypertension   . Vaginal Pap smear, abnormal     Past Surgical History:  Procedure Laterality Date  . arthoscopic knee Right 2013  . CESAREAN SECTION N/A 11/05/2012   Procedure: CESAREAN SECTION;  Surgeon: Lahoma Crocker, MD;  Location: [place] [facility];  Service: Obstetrics;  Laterality: N/A;  . CESAREAN SECTION N/A 04/02/2018   Procedure: CESAREAN SECTION;  Surgeon: Guss Bunde, MD;  Location: [place];  Service: Obstetrics;  Laterality: N/A;  . COLPOSCOPY  05/07/2016  . knee srthoscopy N/A     There were no vitals filed for this visit.   Subjective Assessment - 11/10/19 1401    Subjective I felt good from last visit. The stretching helps. I still have pain. When sit for 1 hour and get up quickly then get a pull.    Patient Stated Goals Reduce pain    Currently in Pain? Yes    Pain Score 8     Pain Location Sacrum    Pain Orientation Right    Pain Descriptors / Indicators Sharp    Pain Type Acute pain    Pain Onset More than a month ago    Pain  Frequency Intermittent    Aggravating Factors  when sitting at work for one hour then get up    Pain Relieving Factors morning    Multiple Pain Sites No              [facility] PT Assessment - 11/10/19 0001      Assessment   Medical Diagnosis M54.30 Sciatic leg pain    Referring Provider (PT) Dr. Vivien Rota    Onset Date/Surgical Date 08/13/19    Prior Therapy none for SI joint      Precautions   Precautions Other (comment)    Precaution Comments prenant   due date 03/13/2020     Restrictions   Weight Bearing Restrictions No      Home Environment   Living Environment Private residence      Prior Function   Level of Independence Independent    Vocation Full time employment    Vocation Requirements sitting     Leisure none      Cognition   Overall Cognitive Status Within Functional Limits for tasks assessed      Posture/Postural Control   Posture/Postural Control Postural limitations    Posture Comments pregnant      AROM   Lumbar -  Left Side Bend full with pain in right SI      Palpation   Spinal mobility tightness in the thoracolumbar junction    SI assessment  right ilium rotation anteriorly; sacrum rotated left    Palpation comment tenderness in the right gluteal      Special Tests    Special Tests Sacrolliac Tests      Pelvic Compression   Findings Positive    Side Right    comment pain with compression in sidely                         [facility] Adult PT Treatment/Exercise - 11/10/19 0001      Therapeutic Activites    Therapeutic Activities Other Therapeutic Activities    Other Therapeutic Activities discuassed with patient on sitting position on a stool, how to adjust the stool, get up often, try sitting on the chair      Lumbar Exercises: Stretches   Lower Trunk Rotation 1 rep;30 seconds    Lower Trunk Rotation Limitations left and right with top leg straight      Lumbar Exercises: Supine   Bridge 10 reps    Bridge Limitations rock  pelvis      Knee/Hip Exercises: Sidelying   Other Sidelying Knee/Hip Exercises reverse clam 15x each side    Other Sidelying Knee/Hip Exercises right posterior hip glide 15x with pressing foot into therapist hand and not moving the pelvis      Manual Therapy   Manual Therapy Joint mobilization    Joint Mobilization gapping of the right facets in left sidely ; right posterior right hip glide in left sidely                  PT Education - 11/10/19 1439    Education Details Access Code: [email or web address]; intruction on sitting posture to reduce her pain    Person(s) Educated Patient    Methods Explanation;Demonstration;Verbal cues;Handout    Comprehension Returned demonstration;Verbalized understanding            PT Short Term Goals - 11/10/19 1444      PT SHORT TERM GOAL #1   Title independent with initial HEP    Baseline just educated    Time 4    Period Weeks    Status On-going             PT Long Term Goals - 11/10/19 1436      PT LONG TERM GOAL #1   Title independent with HEP    Baseline progresses as patient feels better    Time 12    Period Weeks    Status On-going      PT LONG TERM GOAL #2   Title able to from sit to stand at work with pain level </= 2/10    Baseline pain level 8/10    Time 12    Period Weeks    Status On-going      PT LONG TERM GOAL #3   Title able to lay on her back and straighten her legs with pain level </= 2/10    Baseline pain level 6/10    Time 12    Period Weeks    Status On-going      PT LONG TERM GOAL #4   Title understand different birthing positions to decrease strain on the right SI joint    Baseline not educated yet    Time 12    Period Weeks  Status On-going                 Plan - 11/10/19 1409    Clinical Impression Statement Patient reports she feels better with the stretches. Her pain is more 8/10 instead of 9/10. Patient has not been in therapy 10/13/2019 due to availability of appointments and her  schedule. Patient has learned how to sit on her stool to reduce the stress on the right SI joint and to try a regular chair. Her pain is worse when sitting on a stool for 1 hour then get up at work. Patient pelvis in correct alignment after manual work. Patient has tightness in the thoracic lumbar area and right gluteal. Patient will benefit from skilled therapy to work on spinal mobility and correcting the ilium, while working on the SI stability.    Personal Factors and Comorbidities Comorbidity 1;Profession    Comorbidities 2 C-sections    Examination-Activity Limitations Sit;Bed Mobility    Stability/Clinical Decision Making Evolving/Moderate complexity    Rehab Potential Excellent    PT Frequency 1x / week    PT Duration 12 weeks    PT Treatment/Interventions Cryotherapy;Moist Heat;Therapeutic activities;Therapeutic exercise;Neuromuscular re-education;Manual techniques;Patient/family education;Spinal Manipulations    PT Next Visit Plan check SI alignment, work on thoracic alignment, neural tension stretch, body mechanics with work and at home, different birthing positions; quadruped fire hydrane    PT Home Exercise Plan Access Code: [email or web address]    Recommended Other Services MD signed intiial eval    Consulted and Agree with Plan of Care Patient           Patient will benefit from skilled therapeutic intervention in order to improve the following deficits and impairments:  Increased fascial restricitons, Pain, Decreased strength, Decreased activity tolerance  Visit Diagnosis: Acute right-sided low back pain with right-sided sciatica     Problem List Patient Active Problem List   Diagnosis Date Noted  . Proteinuria affecting pregnancy 10/06/2019  . Supervision of high risk pregnancy, antepartum 08/03/2019  . Status post cesarean delivery 04/02/2018  . Chronic hypertension affecting pregnancy 12/08/2017  . H/O pre-eclampsia in prior pregnancy, currently pregnant 11/10/2017  . History  of C-section 09/15/2017  . Hypertension 09/15/2017  . Obesity in pregnancy 09/15/2017    Earlie Counts, PT 11/10/19 2:46 PM   [facility] [address], [place], [place], [address] Phone: [phone]   Fax:  [phone]  Name: Monica [name] MRN: [id number] Date of Birth: 1995-07-23  PHYSICAL THERAPY DISCHARGE SUMMARY  Visits from Start of Care: 2  Current functional level related to goals / functional outcomes: See above. Patient has no-showed for her last 2 visits. She is being discharged due to our attendance policy.    Remaining deficits: See above.    Education / Equipment: HEP Plan:                                                    Patient goals were not met. Patient is being discharged due to not returning since the last visit.  Thank you for the referral. Earlie Counts, PT 12/01/19 2:32 PM  ?????

## 2019-11-10 NOTE — Patient Instructions (Signed)
Access Code: [email or web address] URL: [email or web address] Date: 11/10/2019 Prepared by: Eulis Foster  Exercises Mid-Thoracic Spine Mobilization with Taped Tennis Balls Shoulder Flexion at Wall - 1 x daily - 7 x weekly - 1 sets - 10 reps Seated Piriformis Stretch with Trunk Bend - 1 x daily - 7 x weekly - 2 reps - 30 sec hold Supine Lower Trunk Rotation - 1 x daily - 7 x weekly - 3 sets - 10 reps Supine Lower Trunk Rotation - 1 x daily - 7 x weekly - 2 reps - 15 sec hold Sidelying Reverse Clamshell - 1 x daily - 7 x weekly - 1 sets - 10 reps [facility] [address], [address] [place], [place] [address] Phone # [phone] Fax [phone] s

## 2019-11-18 ENCOUNTER — Ambulatory Visit: Payer: Medicaid Other

## 2019-11-24 ENCOUNTER — Ambulatory Visit: Payer: Medicaid Other | Attending: Obstetrics and Gynecology | Admitting: Physical Therapy

## 2019-12-01 ENCOUNTER — Other Ambulatory Visit: Payer: Self-pay

## 2019-12-01 ENCOUNTER — Ambulatory Visit: Payer: Medicaid Other

## 2019-12-01 ENCOUNTER — Other Ambulatory Visit: Payer: Self-pay | Admitting: *Deleted

## 2019-12-01 ENCOUNTER — Ambulatory Visit: Payer: Medicaid Other | Admitting: *Deleted

## 2019-12-01 ENCOUNTER — Telehealth (INDEPENDENT_AMBULATORY_CARE_PROVIDER_SITE_OTHER): Payer: Medicaid Other | Admitting: Obstetrics & Gynecology

## 2019-12-01 ENCOUNTER — Ambulatory Visit: Payer: Medicaid Other | Admitting: Physical Therapy

## 2019-12-01 ENCOUNTER — Telehealth: Payer: Self-pay | Admitting: Physical Therapy

## 2019-12-01 ENCOUNTER — Ambulatory Visit: Payer: Medicaid Other | Attending: Obstetrics

## 2019-12-01 VITALS — BP 113/75 | HR 95

## 2019-12-01 DIAGNOSIS — O0992 Supervision of high risk pregnancy, unspecified, second trimester: Secondary | ICD-10-CM | POA: Diagnosis not present

## 2019-12-01 DIAGNOSIS — O09292 Supervision of pregnancy with other poor reproductive or obstetric history, second trimester: Secondary | ICD-10-CM

## 2019-12-01 DIAGNOSIS — Z8759 Personal history of other complications of pregnancy, childbirth and the puerperium: Secondary | ICD-10-CM

## 2019-12-01 DIAGNOSIS — E669 Obesity, unspecified: Secondary | ICD-10-CM

## 2019-12-01 DIAGNOSIS — O99212 Obesity complicating pregnancy, second trimester: Secondary | ICD-10-CM

## 2019-12-01 DIAGNOSIS — O34219 Maternal care for unspecified type scar from previous cesarean delivery: Secondary | ICD-10-CM

## 2019-12-01 DIAGNOSIS — O10919 Unspecified pre-existing hypertension complicating pregnancy, unspecified trimester: Secondary | ICD-10-CM

## 2019-12-01 DIAGNOSIS — Z3A25 25 weeks gestation of pregnancy: Secondary | ICD-10-CM

## 2019-12-01 DIAGNOSIS — O10912 Unspecified pre-existing hypertension complicating pregnancy, second trimester: Secondary | ICD-10-CM

## 2019-12-01 DIAGNOSIS — Z362 Encounter for other antenatal screening follow-up: Secondary | ICD-10-CM | POA: Diagnosis not present

## 2019-12-01 DIAGNOSIS — O0990 Supervision of high risk pregnancy, unspecified, unspecified trimester: Secondary | ICD-10-CM

## 2019-12-01 DIAGNOSIS — O283 Abnormal ultrasonic finding on antenatal screening of mother: Secondary | ICD-10-CM

## 2019-12-01 DIAGNOSIS — Z98891 History of uterine scar from previous surgery: Secondary | ICD-10-CM | POA: Diagnosis not present

## 2019-12-01 DIAGNOSIS — O10012 Pre-existing essential hypertension complicating pregnancy, second trimester: Secondary | ICD-10-CM | POA: Diagnosis not present

## 2019-12-01 NOTE — Progress Notes (Signed)
Virtual Visit via Telephone Note  I connected with Monica [name] on 12/01/19 at  8:15 AM EDT by telephone and verified that I am speaking with the correct person using two identifiers.  Pt presents for ROB without complaints today.

## 2019-12-01 NOTE — Telephone Encounter (Signed)
Called patient. She reported she was running late so decided not to come to therapy. Therapist let patient know she will need a new prescription for physical therapy to return due to our no-show and cancellation policy. She no-showed for her last 2 visits.  Eulis Foster, PT @7 /12/2019@ 2:29 PM

## 2019-12-01 NOTE — Progress Notes (Signed)
   TELEHEALTH OBSTETRICS VISIT ENCOUNTER NOTE  I connected with Monica [name] on 12/01/19 at  8:15 AM EDT by telephone at home and verified that I am speaking with the correct person using two identifiers.   I discussed the limitations, risks, security and privacy concerns of performing an evaluation and management service by telephone and the availability of in person appointments. I also discussed with the patient that there may be a patient responsible charge related to this service. The patient expressed understanding and agreed to proceed.  Subjective:  Monica [name] is a 24 y.o. G3P2002 at [redacted]w[redacted]d being followed for ongoing prenatal care.  She is currently monitored for the following issues for this high-risk pregnancy and has History of C-section; Hypertension; Obesity in pregnancy; H/O pre-eclampsia in prior pregnancy, currently pregnant; Chronic hypertension affecting pregnancy; Status post cesarean delivery; Supervision of high risk pregnancy, antepartum; and Proteinuria affecting pregnancy on their problem list.  Patient reports no complaints. Reports fetal movement. Denies any contractions, bleeding or leaking of fluid.   The following portions of the patient's history were reviewed and updated as appropriate: allergies, current medications, past family history, past medical history, past social history, past surgical history and problem list.   Objective:   General:  Alert, oriented and cooperative.   Mental Status: Normal mood and affect perceived. Normal judgment and thought content.  Rest of physical exam deferred due to type of encounter  Assessment and Plan:  Pregnancy: G3P2002 at [redacted]w[redacted]d 1. Chronic hypertension affecting pregnancy We will send a BP cuff for home monitoring  2. Supervision of high risk pregnancy, antepartum   3. History of C-section Repeat at 39 weeks  Preterm labor symptoms and general obstetric precautions including but not limited to vaginal bleeding,  contractions, leaking of fluid and fetal movement were reviewed in detail with the patient.  I discussed the assessment and treatment plan with the patient. The patient was provided an opportunity to ask questions and all were answered. The patient agreed with the plan and demonstrated an understanding of the instructions. The patient was advised to call back or seek an in-person office evaluation/go to MAU at [facility] for any urgent or concerning symptoms. Please refer to After Visit Summary for other counseling recommendations.   I provided 10 minutes of non-face-to-face time during this encounter.  Return in about 2 weeks (around 12/15/2019) for 2 hr GTT.  Future Appointments  Date Time Provider Department Center  12/01/2019 11:30 AM [facility] NURSE [facility]  12/01/2019 11:30 AM [facility] US3 [facility]  12/01/2019  2:00 PM Theressa Millard, PT [facility]    Scheryl Darter, MD Center for [facility], [facility]

## 2019-12-01 NOTE — Patient Instructions (Signed)

## 2019-12-29 ENCOUNTER — Other Ambulatory Visit: Payer: Self-pay | Admitting: *Deleted

## 2019-12-29 ENCOUNTER — Encounter: Payer: Self-pay | Admitting: *Deleted

## 2019-12-29 ENCOUNTER — Ambulatory Visit: Payer: Medicaid Other | Attending: Obstetrics and Gynecology

## 2019-12-29 ENCOUNTER — Other Ambulatory Visit: Payer: Self-pay

## 2019-12-29 ENCOUNTER — Ambulatory Visit: Payer: Medicaid Other | Admitting: *Deleted

## 2019-12-29 VITALS — BP 122/65 | HR 94

## 2019-12-29 DIAGNOSIS — O10913 Unspecified pre-existing hypertension complicating pregnancy, third trimester: Secondary | ICD-10-CM

## 2019-12-29 DIAGNOSIS — I10 Essential (primary) hypertension: Secondary | ICD-10-CM | POA: Diagnosis present

## 2019-12-29 DIAGNOSIS — O99213 Obesity complicating pregnancy, third trimester: Secondary | ICD-10-CM | POA: Diagnosis not present

## 2019-12-29 DIAGNOSIS — O283 Abnormal ultrasonic finding on antenatal screening of mother: Secondary | ICD-10-CM | POA: Diagnosis not present

## 2019-12-29 DIAGNOSIS — O09293 Supervision of pregnancy with other poor reproductive or obstetric history, third trimester: Secondary | ICD-10-CM | POA: Diagnosis not present

## 2019-12-29 DIAGNOSIS — Z3A29 29 weeks gestation of pregnancy: Secondary | ICD-10-CM

## 2019-12-29 DIAGNOSIS — O10919 Unspecified pre-existing hypertension complicating pregnancy, unspecified trimester: Secondary | ICD-10-CM | POA: Insufficient documentation

## 2019-12-29 DIAGNOSIS — O34219 Maternal care for unspecified type scar from previous cesarean delivery: Secondary | ICD-10-CM

## 2020-01-03 ENCOUNTER — Encounter: Payer: Self-pay | Admitting: Obstetrics

## 2020-01-03 ENCOUNTER — Ambulatory Visit (INDEPENDENT_AMBULATORY_CARE_PROVIDER_SITE_OTHER): Payer: Medicaid Other | Admitting: Obstetrics

## 2020-01-03 ENCOUNTER — Other Ambulatory Visit: Payer: Self-pay

## 2020-01-03 ENCOUNTER — Other Ambulatory Visit: Payer: Medicaid Other

## 2020-01-03 VITALS — BP 115/82 | HR 90 | Wt 243.3 lb

## 2020-01-03 DIAGNOSIS — O09293 Supervision of pregnancy with other poor reproductive or obstetric history, third trimester: Secondary | ICD-10-CM

## 2020-01-03 DIAGNOSIS — O10913 Unspecified pre-existing hypertension complicating pregnancy, third trimester: Secondary | ICD-10-CM

## 2020-01-03 DIAGNOSIS — O0990 Supervision of high risk pregnancy, unspecified, unspecified trimester: Secondary | ICD-10-CM | POA: Diagnosis not present

## 2020-01-03 DIAGNOSIS — Z98891 History of uterine scar from previous surgery: Secondary | ICD-10-CM

## 2020-01-03 DIAGNOSIS — O34219 Maternal care for unspecified type scar from previous cesarean delivery: Secondary | ICD-10-CM

## 2020-01-03 DIAGNOSIS — E669 Obesity, unspecified: Secondary | ICD-10-CM

## 2020-01-03 DIAGNOSIS — Z3A30 30 weeks gestation of pregnancy: Secondary | ICD-10-CM

## 2020-01-03 DIAGNOSIS — O99210 Obesity complicating pregnancy, unspecified trimester: Secondary | ICD-10-CM

## 2020-01-03 DIAGNOSIS — O10919 Unspecified pre-existing hypertension complicating pregnancy, unspecified trimester: Secondary | ICD-10-CM

## 2020-01-03 DIAGNOSIS — O09299 Supervision of pregnancy with other poor reproductive or obstetric history, unspecified trimester: Secondary | ICD-10-CM

## 2020-01-03 DIAGNOSIS — O99213 Obesity complicating pregnancy, third trimester: Secondary | ICD-10-CM

## 2020-01-03 NOTE — Progress Notes (Signed)
Subjective:  Monica [name] is a 24 y.o. G3P2002 at [redacted]w[redacted]d being seen today for ongoing prenatal care.  She is currently monitored for the following issues for this high-risk pregnancy and has History of C-section; Hypertension; Obesity in pregnancy; H/O pre-eclampsia in prior pregnancy, currently pregnant; Chronic hypertension affecting pregnancy; Status post cesarean delivery; Supervision of high risk pregnancy, antepartum; and Proteinuria affecting pregnancy on their problem list.  Patient reports no complaints.  Contractions: Not present. Vag. Bleeding: None.  Movement: Present. Denies leaking of fluid.   The following portions of the patient's history were reviewed and updated as appropriate: allergies, current medications, past family history, past medical history, past social history, past surgical history and problem list. Problem list updated.  Objective:   Vitals:   01/03/20 0816  BP: 115/82  Pulse: 90  Weight: 243 lb 4.8 oz (110.4 kg)    Fetal Status:     Movement: Present     General:  Alert, oriented and cooperative. Patient is in no acute distress.  Skin: Skin is warm and dry. No rash noted.   Cardiovascular: Normal heart rate noted  Respiratory: Normal respiratory effort, no problems with respiration noted  Abdomen: Soft, gravid, appropriate for gestational age. Pain/Pressure: Absent     Pelvic:  Cervical exam deferred        Extremities: Normal range of motion.  Edema: None  Mental Status: Normal mood and affect. Normal behavior. Normal judgment and thought content.   Urinalysis:      Assessment and Plan:  Pregnancy: G3P2002 at [redacted]w[redacted]d  1. Supervision of high risk pregnancy, antepartum Rx: - Glucose Tolerance, 2 Hours w/1 Hour - HIV Antibody (routine testing w rflx) - RPR - CBC  2. History of C-section  3. Chronic hypertension affecting pregnancy - clinically stable  4. H/O pre-eclampsia in prior pregnancy, currently pregnant - taking Baby ASA  5. Obesity in  pregnancy   Preterm labor symptoms and general obstetric precautions including but not limited to vaginal bleeding, contractions, leaking of fluid and fetal movement were reviewed in detail with the patient. Please refer to After Visit Summary for other counseling recommendations.   Return in about 2 weeks (around 01/17/2020) for MyChart [facility]Faculty Only.   Brock Bad, MD  01/03/20

## 2020-01-03 NOTE — Progress Notes (Signed)
Pt presents for 2 gtt labs TDap offered; pt declined No concerns today per pt

## 2020-01-04 LAB — RPR: RPR Ser Ql: NONREACTIVE

## 2020-01-04 LAB — CBC
Hematocrit: 35 % (ref 34.0–46.6)
Hemoglobin: 11.3 g/dL (ref 11.1–15.9)
MCH: 24.7 pg — ABNORMAL LOW (ref 26.6–33.0)
MCHC: 32.3 g/dL (ref 31.5–35.7)
MCV: 76 fL — ABNORMAL LOW (ref 79–97)
Platelets: 297 10*3/uL (ref 150–450)
RBC: 4.58 x10E6/uL (ref 3.77–5.28)
RDW: 14 % (ref 11.7–15.4)
WBC: 10.4 10*3/uL (ref 3.4–10.8)

## 2020-01-04 LAB — GLUCOSE TOLERANCE, 2 HOURS W/ 1HR
Glucose, 1 hour: 165 mg/dL (ref 65–179)
Glucose, 2 hour: 85 mg/dL (ref 65–152)
Glucose, Fasting: 87 mg/dL (ref 65–91)

## 2020-01-04 LAB — HIV ANTIBODY (ROUTINE TESTING W REFLEX): HIV Screen 4th Generation wRfx: NONREACTIVE

## 2020-01-17 ENCOUNTER — Telehealth (INDEPENDENT_AMBULATORY_CARE_PROVIDER_SITE_OTHER): Payer: Medicaid Other | Admitting: Obstetrics and Gynecology

## 2020-01-17 DIAGNOSIS — Z5329 Procedure and treatment not carried out because of patient's decision for other reasons: Secondary | ICD-10-CM

## 2020-01-23 ENCOUNTER — Encounter: Payer: Self-pay | Admitting: Obstetrics and Gynecology

## 2020-01-23 ENCOUNTER — Telehealth (INDEPENDENT_AMBULATORY_CARE_PROVIDER_SITE_OTHER): Payer: Medicaid Other | Admitting: Obstetrics and Gynecology

## 2020-01-23 DIAGNOSIS — O10919 Unspecified pre-existing hypertension complicating pregnancy, unspecified trimester: Secondary | ICD-10-CM | POA: Diagnosis not present

## 2020-01-23 DIAGNOSIS — Z98891 History of uterine scar from previous surgery: Secondary | ICD-10-CM | POA: Diagnosis not present

## 2020-01-23 DIAGNOSIS — O1213 Gestational proteinuria, third trimester: Secondary | ICD-10-CM

## 2020-01-23 DIAGNOSIS — O99210 Obesity complicating pregnancy, unspecified trimester: Secondary | ICD-10-CM | POA: Diagnosis not present

## 2020-01-23 DIAGNOSIS — O0990 Supervision of high risk pregnancy, unspecified, unspecified trimester: Secondary | ICD-10-CM

## 2020-01-23 DIAGNOSIS — O09299 Supervision of pregnancy with other poor reproductive or obstetric history, unspecified trimester: Secondary | ICD-10-CM | POA: Diagnosis not present

## 2020-01-23 NOTE — Progress Notes (Signed)
   Patient did not show up today for her scheduled appointment.   Brevan Luberto, MD, FACOG Obstetrician & Gynecologist, [facility] Practice [facility], [facility]  

## 2020-01-23 NOTE — Progress Notes (Signed)
I connected with Monica [name] on 01/23/20 at  2:45 PM EDT by telephone and verified that I am speaking with the correct person using two identifiers.  Pt not home unable to check BP No complaints per pt

## 2020-01-23 NOTE — Progress Notes (Signed)
OBSTETRICS PRENATAL VIRTUAL VISIT ENCOUNTER NOTE  Provider location: Center for [facility]   I connected with Monica [name] on 01/23/20 at  2:45 PM EDT by MyChart Video Encounter at home and verified that I am speaking with the correct person using two identifiers.   I discussed the limitations, risks, security and privacy concerns of performing an evaluation and management service virtually and the availability of in person appointments. I also discussed with the patient that there may be a patient responsible charge related to this service. The patient expressed understanding and agreed to proceed. Subjective:  Monica [name] is a 24 y.o. G3P2002 at [redacted]w[redacted]d being seen today for ongoing prenatal care.  She is currently monitored for the following issues for this high-risk pregnancy and has History of C-section; Hypertension; Obesity in pregnancy; H/O pre-eclampsia in prior pregnancy, currently pregnant; Chronic hypertension affecting pregnancy; Status post cesarean delivery; Supervision of high risk pregnancy, antepartum; and Proteinuria affecting pregnancy on their problem list.  Patient reports no complaints.  Contractions: Not present. Vag. Bleeding: None.  Movement: Present. Denies any leaking of fluid.   The following portions of the patient's history were reviewed and updated as appropriate: allergies, current medications, past family history, past medical history, past social history, past surgical history and problem list.   Objective:  There were no vitals filed for this visit.  Fetal Status:     Movement: Present     General:  Alert, oriented and cooperative. Patient is in no acute distress.  Respiratory: Normal respiratory effort, no problems with respiration noted  Mental Status: Normal mood and affect. Normal behavior. Normal judgment and thought content.  Rest of physical exam deferred due to type of encounter  Imaging: [place] MFM OB FOLLOW UP  Result Date:  12/29/2019 ----------------------------------------------------------------------  OBSTETRICS REPORT                       (Signed Final 12/29/2019 08:47 am) ---------------------------------------------------------------------- Patient Info  ID #:       [id number]                          D.O.B.:  [birth year] (24 yrs)  Name:       Monica [name]                   Visit Date: 12/29/2019 07:40 am ---------------------------------------------------------------------- Performed By  Attending:        Noralee Space MD        Secondary Phy.:   [facility]  Performed By:     Tomma Lightning             Address:          [address]                    [address]                                                             [address]  [address]                                                             [place]                                                             [address]  Referred By:      Gigi Gin                  [facility]:         [facility]                    Haillee Johann MD                              [facility]                                                             [facility]  Ref. Address:     Faculty ---------------------------------------------------------------------- Orders  #  Description                           Code        Ordered By  1  [place] MFM OB FOLLOW UP                   [id number]    RAVI SHANKAR ----------------------------------------------------------------------  #  Order #                     Accession #                Episode #  1  [id number]                   [id number]                 [id number] ---------------------------------------------------------------------- Indications  Pyelectasis of fetus on prenatal ultrasound    O28.3  Obesity complicating pregnancy, second         O99.212  trimester (BMI 43)  [redacted] weeks gestation of pregnancy                Z3A.29  Encounter for  other antenatal screening        Z36.2  follow-up (Low risk NIPS)  Hypertension - Chronic/Pre-existing            O10.019  Previous cesarean delivery, antepartum  O34.219  Poor obstetric history: Previous               O09.299  preeclampsia / eclampsia/gestational HTN ---------------------------------------------------------------------- Fetal Evaluation  Num Of Fetuses:         1  Fetal Heart Rate(bpm):  154  Cardiac Activity:       Observed  Presentation:           Cephalic  Placenta:               Anterior  P. Cord Insertion:      Previously Visualized  Amniotic Fluid  AFI FV:      Within normal limits  AFI Sum(cm)     %Tile       Largest Pocket(cm)  12.77           35          3.93  RUQ(cm)       RLQ(cm)       LUQ(cm)        LLQ(cm)  2.62          2.9           3.32           3.93 ---------------------------------------------------------------------- Biometry  BPD:        74  mm     G. Age:  29w 5d         51  %    CI:        74.04   %    70 - 86                                                          FL/HC:      20.7   %    19.6 - 20.8  HC:      273.1  mm     G. Age:  29w 6d         31  %    HC/AC:      1.02        0.99 - 1.21  AC:      268.1  mm     G. Age:  30w 6d         87  %    FL/BPD:     76.5   %    71 - 87  FL:       56.6  mm     G. Age:  29w 5d         47  %    FL/AC:      21.1   %    20 - 24  HUM:      50.9  mm     G. Age:  29w 6d         56  %  LV:        5.7  mm  Est. FW:    1553  gm      3 lb 7 oz     75  % ---------------------------------------------------------------------- OB History  Blood Type:   O+  Gravidity:    3         Term:   2        Prem:   0        SAB:  0  TOP:          0       Ectopic:  0        Living: 2 ---------------------------------------------------------------------- Gestational Age  LMP:           29w 2d        Date:  06/07/19                 EDD:   03/13/20  U/S Today:     30w 0d                                        EDD:   03/08/20  Best:          29w 2d      Det. By:  LMP  (06/07/19)          EDD:   03/13/20 ---------------------------------------------------------------------- Anatomy  Cranium:               Appears normal         Aortic Arch:            Previously seen  Cavum:                 Previously seen        Ductal Arch:            Previously seen  Ventricles:            Appears normal         Diaphragm:              Appears normal  Choroid Plexus:        Previously seen        Stomach:                Appears normal, left                                                                        sided  Cerebellum:            Previously seen        Abdomen:                Appears normal  Posterior Fossa:       Previously seen        Abdominal Wall:         Previously seen  Nuchal Fold:           Previously seen        Cord Vessels:           Previously seen  Face:                  Orbits and profile     Kidneys:                Appear normal                         previously seen  Lips:                  Previously seen  Bladder:                Appears normal  Thoracic:              Appears normal         Spine:                  Limited views                                                                        previously seen  Heart:                 Appears normal         Upper Extremities:      Previously seen                         (4CH, axis, and                         situs)  RVOT:                  Appears normal         Lower Extremities:      Previously seen  LVOT:                  Previously seen  Other:  Fetus appears to be a female prev seen.  Technically difficult due to          maternal habitus and fetal position. ---------------------------------------------------------------------- Cervix Uterus Adnexa  Cervix  Not visualized (advanced GA >24wks) ---------------------------------------------------------------------- Impression  Amniotic fluid is normal and good fetal activity is seen .Fetal  growth is appropriate for gestational age .  Both  kidneys  appear normal with no evidence of urinary tract dilations.  Blood pressure today at her office is 122/65 mmHg. ---------------------------------------------------------------------- Recommendations  -An appointment was made for her to return in 4 weeks for  fetal growth assessment. ----------------------------------------------------------------------                  Noralee Space, MD Electronically Signed Final Report   12/29/2019 08:47 am ----------------------------------------------------------------------   Assessment and Plan:  Pregnancy: [id number] at [redacted]w[redacted]d 1. Supervision of high risk pregnancy, antepartum Patient is doing well without complaints  2. Chronic hypertension affecting pregnancy Unable to take BP Follow up growth ultrasound  3. H/O pre-eclampsia in prior pregnancy, currently pregnant Si/Sx reviewed with the patient  4. History of C-section Patient will be scheduled for repeat c-section Considering BTL- will sign papers at her next visit  5. Obesity in pregnancy COntinue ASA  6. Proteinuria affecting pregnancy in third trimester   Preterm labor symptoms and general obstetric precautions including but not limited to vaginal bleeding, contractions, leaking of fluid and fetal movement were reviewed in detail with the patient. I discussed the assessment and treatment plan with the patient. The patient was provided an opportunity to ask questions and all were answered. The patient agreed with the plan and demonstrated an understanding of the instructions. The patient was advised to call back or seek an in-person office evaluation/go to [facility]U at [facility] for any urgent or concerning  symptoms. Please refer to After Visit Summary for other counseling recommendations.   I provided 15 minutes of face-to-face time during this encounter.  No follow-ups on file.  Future Appointments  Date Time Provider Department Center  01/23/2020  2:45 PM Selden Noteboom,  Gigi Gin, MD [facility] None  01/27/2020  7:45 AM [facility] NURSE [facility]  01/27/2020  8:00 AM [facility] US1 [facility]    Catalina Antigua, MD Center for [employer], [facility]

## 2020-01-27 ENCOUNTER — Other Ambulatory Visit: Payer: Self-pay

## 2020-01-27 ENCOUNTER — Other Ambulatory Visit: Payer: Self-pay | Admitting: *Deleted

## 2020-01-27 ENCOUNTER — Ambulatory Visit: Payer: Medicaid Other | Attending: Obstetrics and Gynecology

## 2020-01-27 ENCOUNTER — Ambulatory Visit: Payer: Medicaid Other

## 2020-01-27 DIAGNOSIS — Z3A33 33 weeks gestation of pregnancy: Secondary | ICD-10-CM

## 2020-01-27 DIAGNOSIS — O34219 Maternal care for unspecified type scar from previous cesarean delivery: Secondary | ICD-10-CM

## 2020-01-27 DIAGNOSIS — O10919 Unspecified pre-existing hypertension complicating pregnancy, unspecified trimester: Secondary | ICD-10-CM | POA: Diagnosis present

## 2020-01-27 DIAGNOSIS — E669 Obesity, unspecified: Secondary | ICD-10-CM | POA: Diagnosis not present

## 2020-01-27 DIAGNOSIS — Z362 Encounter for other antenatal screening follow-up: Secondary | ICD-10-CM

## 2020-01-27 DIAGNOSIS — O09293 Supervision of pregnancy with other poor reproductive or obstetric history, third trimester: Secondary | ICD-10-CM

## 2020-01-27 DIAGNOSIS — Z6841 Body Mass Index (BMI) 40.0 and over, adult: Secondary | ICD-10-CM | POA: Diagnosis not present

## 2020-01-27 DIAGNOSIS — O10013 Pre-existing essential hypertension complicating pregnancy, third trimester: Secondary | ICD-10-CM

## 2020-01-27 DIAGNOSIS — O99213 Obesity complicating pregnancy, third trimester: Secondary | ICD-10-CM | POA: Diagnosis not present

## 2020-02-01 ENCOUNTER — Other Ambulatory Visit: Payer: Self-pay | Admitting: *Deleted

## 2020-02-01 ENCOUNTER — Encounter: Payer: Self-pay | Admitting: *Deleted

## 2020-02-01 ENCOUNTER — Other Ambulatory Visit: Payer: Self-pay

## 2020-02-01 ENCOUNTER — Ambulatory Visit: Payer: Medicaid Other | Attending: Obstetrics and Gynecology

## 2020-02-01 ENCOUNTER — Ambulatory Visit: Payer: Medicaid Other | Admitting: *Deleted

## 2020-02-01 VITALS — BP 107/66 | HR 87

## 2020-02-01 DIAGNOSIS — O4103X0 Oligohydramnios, third trimester, not applicable or unspecified: Secondary | ICD-10-CM

## 2020-02-01 DIAGNOSIS — E669 Obesity, unspecified: Secondary | ICD-10-CM | POA: Diagnosis not present

## 2020-02-01 DIAGNOSIS — O09293 Supervision of pregnancy with other poor reproductive or obstetric history, third trimester: Secondary | ICD-10-CM | POA: Diagnosis not present

## 2020-02-01 DIAGNOSIS — Z362 Encounter for other antenatal screening follow-up: Secondary | ICD-10-CM | POA: Diagnosis not present

## 2020-02-01 DIAGNOSIS — O10013 Pre-existing essential hypertension complicating pregnancy, third trimester: Secondary | ICD-10-CM

## 2020-02-01 DIAGNOSIS — O10919 Unspecified pre-existing hypertension complicating pregnancy, unspecified trimester: Secondary | ICD-10-CM | POA: Diagnosis not present

## 2020-02-01 DIAGNOSIS — I10 Essential (primary) hypertension: Secondary | ICD-10-CM

## 2020-02-01 DIAGNOSIS — O99213 Obesity complicating pregnancy, third trimester: Secondary | ICD-10-CM

## 2020-02-01 DIAGNOSIS — Z6841 Body Mass Index (BMI) 40.0 and over, adult: Secondary | ICD-10-CM

## 2020-02-01 DIAGNOSIS — Z3A34 34 weeks gestation of pregnancy: Secondary | ICD-10-CM

## 2020-02-06 ENCOUNTER — Encounter: Payer: Medicaid Other | Admitting: Obstetrics and Gynecology

## 2020-02-09 ENCOUNTER — Ambulatory Visit (INDEPENDENT_AMBULATORY_CARE_PROVIDER_SITE_OTHER): Payer: Medicaid Other | Admitting: Family Medicine

## 2020-02-09 ENCOUNTER — Other Ambulatory Visit: Payer: Self-pay

## 2020-02-09 VITALS — BP 108/73 | HR 101 | Wt 245.5 lb

## 2020-02-09 DIAGNOSIS — O0990 Supervision of high risk pregnancy, unspecified, unspecified trimester: Secondary | ICD-10-CM

## 2020-02-09 DIAGNOSIS — Z98891 History of uterine scar from previous surgery: Secondary | ICD-10-CM

## 2020-02-09 DIAGNOSIS — O10919 Unspecified pre-existing hypertension complicating pregnancy, unspecified trimester: Secondary | ICD-10-CM

## 2020-02-09 NOTE — Progress Notes (Signed)
Patient presents for ROB. Patient has no concerns today. She declines flu vaccine today.

## 2020-02-09 NOTE — Progress Notes (Signed)
   PRENATAL VISIT NOTE  Subjective:  Monica [name] is a 24 y.o. G3P2002 at [redacted]w[redacted]d being seen today for ongoing prenatal care.  She is currently monitored for the following issues for this high-risk pregnancy and has History of C-section; Hypertension; Obesity in pregnancy; H/O pre-eclampsia in prior pregnancy, currently pregnant; Chronic hypertension affecting pregnancy; Status post cesarean delivery; Supervision of high risk pregnancy, antepartum; and Proteinuria affecting pregnancy on their problem list.  Patient reports no complaints.  Contractions: Irritability. Vag. Bleeding: None.  Movement: Present. Denies leaking of fluid.   The following portions of the patient's history were reviewed and updated as appropriate: allergies, current medications, past family history, past medical history, past social history, past surgical history and problem list.   Objective:   Vitals:   02/09/20 1614  BP: 108/73  Pulse: (!) 101  Weight: 245 lb 8 oz (111.4 kg)    Fetal Status: Fetal Heart Rate (bpm): 150   Movement: Present     General:  Alert, oriented and cooperative. Patient is in no acute distress.  Skin: Skin is warm and dry. No rash noted.   Cardiovascular: Normal heart rate noted  Respiratory: Normal respiratory effort, no problems with respiration noted  Abdomen: Soft, gravid, appropriate for gestational age.  Pain/Pressure: Present     Pelvic: Cervical exam deferred        Extremities: Normal range of motion.  Edema: None  Mental Status: Normal mood and affect. Normal behavior. Normal judgment and thought content.   Assessment and Plan:  Pregnancy: G3P2002 at [redacted]w[redacted]d 1. Supervision of high risk pregnancy, antepartum Continue prenatal care.   2. History of C-section Booked for # 3, declines BTL  3. Chronic hypertension affecting pregnancy BP is well controlled on no medications Continue aspirin  Preterm labor symptoms and general obstetric precautions including but not limited  to vaginal bleeding, contractions, leaking of fluid and fetal movement were reviewed in detail with the patient. Please refer to After Visit Summary for other counseling recommendations.   No follow-ups on file.  Future Appointments  Date Time Provider Department Center  02/23/2020  9:00 AM [facility] NURSE [facility]  02/23/2020  9:15 AM [facility] US2 [facility]    Reva Bores, MD

## 2020-02-15 ENCOUNTER — Other Ambulatory Visit: Payer: Self-pay

## 2020-02-15 ENCOUNTER — Other Ambulatory Visit (HOSPITAL_COMMUNITY)
Admission: RE | Admit: 2020-02-15 | Discharge: 2020-02-15 | Disposition: A | Discharge status: Home / Self Care | Payer: Medicaid Other | Source: Ambulatory Visit | Attending: Obstetrics and Gynecology | Admitting: Obstetrics and Gynecology

## 2020-02-15 ENCOUNTER — Ambulatory Visit (INDEPENDENT_AMBULATORY_CARE_PROVIDER_SITE_OTHER): Payer: Medicaid Other | Admitting: Obstetrics and Gynecology

## 2020-02-15 VITALS — BP 123/83 | HR 106 | Wt 250.1 lb

## 2020-02-15 DIAGNOSIS — Z6841 Body Mass Index (BMI) 40.0 and over, adult: Secondary | ICD-10-CM

## 2020-02-15 DIAGNOSIS — Z3A36 36 weeks gestation of pregnancy: Secondary | ICD-10-CM

## 2020-02-15 DIAGNOSIS — O10919 Unspecified pre-existing hypertension complicating pregnancy, unspecified trimester: Secondary | ICD-10-CM

## 2020-02-15 DIAGNOSIS — Z98891 History of uterine scar from previous surgery: Secondary | ICD-10-CM

## 2020-02-15 DIAGNOSIS — O09299 Supervision of pregnancy with other poor reproductive or obstetric history, unspecified trimester: Secondary | ICD-10-CM

## 2020-02-15 DIAGNOSIS — O0990 Supervision of high risk pregnancy, unspecified, unspecified trimester: Secondary | ICD-10-CM | POA: Diagnosis not present

## 2020-02-15 DIAGNOSIS — O99210 Obesity complicating pregnancy, unspecified trimester: Secondary | ICD-10-CM

## 2020-02-15 NOTE — Progress Notes (Signed)
   PRENATAL VISIT NOTE  Subjective:  Monica [name] is a 24 y.o. G3P2002 at [redacted]w[redacted]d being seen today for ongoing prenatal care.  She is currently monitored for the following issues for this high-risk pregnancy and has History of C-section; Hypertension; Obesity in pregnancy; H/O pre-eclampsia in prior pregnancy, currently pregnant; Chronic hypertension affecting pregnancy; Status post cesarean delivery; Supervision of high risk pregnancy, antepartum; Proteinuria affecting pregnancy; BMI 40.0-44.9, adult (HCC); and [redacted] weeks gestation of pregnancy on their problem list.  Patient doing well with no acute concerns today. She reports occasional contractions.  Contractions: Irregular. Vag. Bleeding: None.  Movement: Present. Denies leaking of fluid.   The following portions of the patient's history were reviewed and updated as appropriate: allergies, current medications, past family history, past medical history, past social history, past surgical history and problem list. Problem list updated.  Objective:   Vitals:   02/15/20 1622  BP: 123/83  Pulse: (!) 106  Weight: 250 lb 1.6 oz (113.4 kg)    Fetal Status: Fetal Heart Rate (bpm): 156 Fundal Height: 37 cm Movement: Present     General:  Alert, oriented and cooperative. Patient is in no acute distress.  Skin: Skin is warm and dry. No rash noted.   Cardiovascular: Normal heart rate noted  Respiratory: Normal respiratory effort, no problems with respiration noted  Abdomen: Soft, gravid, appropriate for gestational age.  Pain/Pressure: Present     Pelvic: Cervical exam performed Dilation: Closed Effacement (%): 50 Station: -3  Extremities: Normal range of motion.  Edema: None  Mental Status:  Normal mood and affect. Normal behavior. Normal judgment and thought content.   Assessment and Plan:  Pregnancy: G3P2002 at [redacted]w[redacted]d  1. Supervision of high risk pregnancy, antepartum  - Strep Gp B NAA - Cervicovaginal ancillary only( [place])  2.  Chronic hypertension affecting pregnancy BP WNL with pt on no meds  3. History of C-section Repeat c/s scheduled  4. Obesity in pregnancy   5. H/O pre-eclampsia in prior pregnancy, currently pregnant No s/sx of PIH  6. BMI 40.0-44.9, adult (HCC)   7. [redacted] weeks gestation of pregnancy   Preterm labor symptoms and general obstetric precautions including but not limited to vaginal bleeding, contractions, leaking of fluid and fetal movement were reviewed in detail with the patient.  Please refer to After Visit Summary for other counseling recommendations.   Return in about 1 week (around 02/22/2020) for [facility], virtual.   Mariel Aloe, MD

## 2020-02-15 NOTE — Progress Notes (Signed)
Patient reports fetal movement with some contractions. 

## 2020-02-17 LAB — CERVICOVAGINAL ANCILLARY ONLY
Chlamydia: NEGATIVE
Comment: NEGATIVE
Comment: NORMAL
Neisseria Gonorrhea: NEGATIVE

## 2020-02-17 LAB — STREP GP B NAA: Strep Gp B NAA: NEGATIVE

## 2020-02-23 ENCOUNTER — Ambulatory Visit: Payer: Medicaid Other | Admitting: *Deleted

## 2020-02-23 ENCOUNTER — Telehealth (INDEPENDENT_AMBULATORY_CARE_PROVIDER_SITE_OTHER): Payer: Medicaid Other | Admitting: Obstetrics & Gynecology

## 2020-02-23 ENCOUNTER — Other Ambulatory Visit: Payer: Self-pay

## 2020-02-23 ENCOUNTER — Ambulatory Visit: Payer: Medicaid Other | Attending: Obstetrics and Gynecology

## 2020-02-23 VITALS — BP 114/69 | HR 86

## 2020-02-23 VITALS — BP 126/82 | HR 86

## 2020-02-23 DIAGNOSIS — O10919 Unspecified pre-existing hypertension complicating pregnancy, unspecified trimester: Secondary | ICD-10-CM

## 2020-02-23 DIAGNOSIS — Z3A37 37 weeks gestation of pregnancy: Secondary | ICD-10-CM | POA: Diagnosis not present

## 2020-02-23 DIAGNOSIS — O1213 Gestational proteinuria, third trimester: Secondary | ICD-10-CM

## 2020-02-23 DIAGNOSIS — O99212 Obesity complicating pregnancy, second trimester: Secondary | ICD-10-CM

## 2020-02-23 DIAGNOSIS — Z362 Encounter for other antenatal screening follow-up: Secondary | ICD-10-CM | POA: Diagnosis not present

## 2020-02-23 DIAGNOSIS — O10913 Unspecified pre-existing hypertension complicating pregnancy, third trimester: Secondary | ICD-10-CM

## 2020-02-23 DIAGNOSIS — O0993 Supervision of high risk pregnancy, unspecified, third trimester: Secondary | ICD-10-CM

## 2020-02-23 DIAGNOSIS — O99213 Obesity complicating pregnancy, third trimester: Secondary | ICD-10-CM

## 2020-02-23 DIAGNOSIS — O34219 Maternal care for unspecified type scar from previous cesarean delivery: Secondary | ICD-10-CM

## 2020-02-23 DIAGNOSIS — I10 Essential (primary) hypertension: Secondary | ICD-10-CM | POA: Diagnosis not present

## 2020-02-23 DIAGNOSIS — E669 Obesity, unspecified: Secondary | ICD-10-CM

## 2020-02-23 NOTE — Progress Notes (Signed)
Patient present for Mychart ROB. Pt identified with 2 patient identifiers. BP today is 126/82. Patient has no concerns today.

## 2020-02-23 NOTE — Progress Notes (Signed)
   TELEHEALTH OBSTETRICS VISIT ENCOUNTER NOTE  I connected with Monica [name] on 02/23/20 at  4:15 PM EDT by telephone at home and verified that I am speaking with the correct person using two identifiers.   I discussed the limitations, risks, security and privacy concerns of performing an evaluation and management service by telephone and the availability of in person appointments. I also discussed with the patient that there may be a patient responsible charge related to this service. The patient expressed understanding and agreed to proceed.  Subjective:  Monica [name] is a 24 y.o. G3P2002 at [redacted]w[redacted]d being followed for ongoing prenatal care.  She is currently monitored for the following issues for this high-risk pregnancy and has History of C-section; Hypertension; Obesity in pregnancy; H/O pre-eclampsia in prior pregnancy, currently pregnant; Chronic hypertension affecting pregnancy; Status post cesarean delivery; Supervision of high risk pregnancy, antepartum; Proteinuria affecting pregnancy; BMI 40.0-44.9, adult (HCC); and [redacted] weeks gestation of pregnancy on their problem list.  Patient reports no complaints. Reports fetal movement. Denies any contractions, bleeding or leaking of fluid.   The following portions of the patient's history were reviewed and updated as appropriate: allergies, current medications, past family history, past medical history, past social history, past surgical history and problem list.   Objective:   General:  Alert, oriented and cooperative.   Mental Status: Normal mood and affect perceived. Normal judgment and thought content.  Rest of physical exam deferred due to type of encounter  Assessment and Plan:  Pregnancy: G3P2002 at [redacted]w[redacted]d 1. History of C-section RCS at 39 weeks  2. Obesity in pregnancy There is no height or weight on file to calculate BMI.   3. Supervision of high risk pregnancy, antepartum BP in wnl  4. Status post cesarean delivery X2  5.  Chronic hypertension affecting pregnancy BP is wnl  Preterm labor symptoms and general obstetric precautions including but not limited to vaginal bleeding, contractions, leaking of fluid and fetal movement were reviewed in detail with the patient.  I discussed the assessment and treatment plan with the patient. The patient was provided an opportunity to ask questions and all were answered. The patient agreed with the plan and demonstrated an understanding of the instructions. The patient was advised to call back or seek an in-person office evaluation/go to MAU at [facility] for any urgent or concerning symptoms. Please refer to After Visit Summary for other counseling recommendations.   I provided 11 minutes of non-face-to-face time during this encounter.  Return in about 1 week (around 03/01/2020) for virtual.  No future appointments.  Scheryl Darter, MD Center for [facility], [facility]

## 2020-02-23 NOTE — Patient Instructions (Signed)
Cesarean Delivery, Care After This sheet gives you information about how to care for yourself after your procedure. Your health care provider may also give you more specific instructions. If you have problems or questions, contact your health care provider. What can I expect after the procedure? After the procedure, it is common to have:  A small amount of blood or clear fluid coming from the incision.  Some redness, swelling, and pain in your incision area.  Some abdominal pain and soreness.  Vaginal bleeding (lochia). Even though you did not have a vaginal delivery, you will still have vaginal bleeding and discharge.  Pelvic cramps.  Fatigue. You may have pain, swelling, and discomfort in the tissue between your vagina and your anus (perineum) if:  Your C-section was unplanned, and you were allowed to labor and push.  An incision was made in the area (episiotomy) or the tissue tore during attempted vaginal delivery. Follow these instructions at home: Incision care   Follow instructions from your health care provider about how to take care of your incision. Make sure you: ? Wash your hands with soap and water before you change your bandage (dressing). If soap and water are not available, use hand sanitizer. ? If you have a dressing, change it or remove it as told by your health care provider. ? Leave stitches (sutures), skin staples, skin glue, or adhesive strips in place. These skin closures may need to stay in place for 2 weeks or longer. If adhesive strip edges start to loosen and curl up, you may trim the loose edges. Do not remove adhesive strips completely unless your health care provider tells you to do that.  Check your incision area every day for signs of infection. Check for: ? More redness, swelling, or pain. ? More fluid or blood. ? Warmth. ? Pus or a bad smell.  Do not take baths, swim, or use a hot tub until your health care provider says it's okay. Ask your health  care provider if you can take showers.  When you cough or sneeze, hug a pillow. This helps with pain and decreases the chance of your incision opening up (dehiscing). Do this until your incision heals. Medicines  Take over-the-counter and prescription medicines only as told by your health care provider.  If you were prescribed an antibiotic medicine, take it as told by your health care provider. Do not stop taking the antibiotic even if you start to feel better.  Do not drive or use heavy machinery while taking prescription pain medicine. Lifestyle  Do not drink alcohol. This is especially important if you are breastfeeding or taking pain medicine.  Do not use any products that contain nicotine or tobacco, such as cigarettes, e-cigarettes, and chewing tobacco. If you need help quitting, ask your health care provider. Eating and drinking  Drink at least 8 eight-ounce glasses of water every day unless told not to by your health care provider. If you breastfeed, you may need to drink even more water.  Eat high-fiber foods every day. These foods may help prevent or relieve constipation. High-fiber foods include: ? Whole grain cereals and breads. ? Brown rice. ? Beans. ? Fresh fruits and vegetables. Activity   If possible, have someone help you care for your baby and help with household activities for at least a few days after you leave the hospital.  Return to your normal activities as told by your health care provider. Ask your health care provider what activities are safe for   you.  Rest as much as possible. Try to rest or take a nap while your baby is sleeping.  Do not lift anything that is heavier than 10 lbs (4.5 kg), or the limit that you were told, until your health care provider says that it is safe.  Talk with your health care provider about when you can engage in sexual activity. This may depend on your: ? Risk of infection. ? How fast you heal. ? Comfort and desire to  engage in sexual activity. General instructions  Do not use tampons or douches until your health care provider approves.  Wear loose, comfortable clothing and a supportive and well-fitting bra.  Keep your perineum clean and dry. Wipe from front to back when you use the toilet.  If you pass a blood clot, save it and call your health care provider to discuss. Do not flush blood clots down the toilet before you get instructions from your health care provider.  Keep all follow-up visits for you and your baby as told by your health care provider. This is important. Contact a health care provider if:  You have: ? A fever. ? Bad-smelling vaginal discharge. ? Pus or a bad smell coming from your incision. ? Difficulty or pain when urinating. ? A sudden increase or decrease in the frequency of your bowel movements. ? More redness, swelling, or pain around your incision. ? More fluid or blood coming from your incision. ? A rash. ? Nausea. ? Little or no interest in activities you used to enjoy. ? Questions about caring for yourself or your baby.  Your incision feels warm to the touch.  Your breasts turn red or become painful or hard.  You feel unusually sad or worried.  You vomit.  You pass a blood clot from your vagina.  You urinate more than usual.  You are dizzy or light-headed. Get help right away if:  You have: ? Pain that does not go away or get better with medicine. ? Chest pain. ? Difficulty breathing. ? Blurred vision or spots in your vision. ? Thoughts about hurting yourself or your baby. ? New pain in your abdomen or in one of your legs. ? A severe headache.  You faint.  You bleed from your vagina so much that you fill more than one sanitary pad in one hour. Bleeding should not be heavier than your heaviest period. Summary  After the procedure, it is common to have pain at your incision site, abdominal cramping, and slight bleeding from your vagina.  Check  your incision area every day for signs of infection.  Tell your health care provider about any unusual symptoms.  Keep all follow-up visits for you and your baby as told by your health care provider. This information is not intended to replace advice given to you by your health care provider. Make sure you discuss any questions you have with your health care provider. Document Revised: 11/18/2017 Document Reviewed: 11/18/2017 Elsevier Patient Education  2020 Elsevier Inc.  

## 2020-02-27 ENCOUNTER — Encounter (HOSPITAL_COMMUNITY): Payer: Self-pay

## 2020-02-27 NOTE — Patient Instructions (Signed)
Cyndia N Leflore  02/27/2020   Your procedure is scheduled on:  03/06/2020  Arrive at 0730 at [employer] [facility] on [address] at [facility]  and [employer]. You are invited to use the FREE valet parking or use the Visitor's parking deck.  Pick up the phone at the desk and dial [phone].  Call this number if you have problems the morning of surgery: [phone]  Remember:   Do not eat food:(After Midnight) Desps de medianoche.  Do not drink clear liquids: (After Midnight) Desps de medianoche.  Take these medicines the morning of surgery with A SIP OF WATER:  none   Do not wear jewelry, make-up or nail polish.  Do not wear lotions, powders, or perfumes. Do not wear deodorant.  Do not shave 48 hours prior to surgery.  Do not bring valuables to the hospital.  [facility] is not   responsible for any belongings or valuables brought to the hospital.  Contacts, dentures or bridgework may not be worn into surgery.  Leave suitcase in the car. After surgery it may be brought to your room.  For patients admitted to the hospital, checkout time is 11:00 AM the day of              discharge.      Please read over the following fact sheets that you were given:     Preparing for Surgery

## 2020-03-02 ENCOUNTER — Telehealth (INDEPENDENT_AMBULATORY_CARE_PROVIDER_SITE_OTHER): Payer: Medicaid Other

## 2020-03-02 VITALS — BP 136/92 | HR 107

## 2020-03-02 DIAGNOSIS — O34219 Maternal care for unspecified type scar from previous cesarean delivery: Secondary | ICD-10-CM

## 2020-03-02 DIAGNOSIS — O10919 Unspecified pre-existing hypertension complicating pregnancy, unspecified trimester: Secondary | ICD-10-CM

## 2020-03-02 DIAGNOSIS — O0990 Supervision of high risk pregnancy, unspecified, unspecified trimester: Secondary | ICD-10-CM

## 2020-03-02 DIAGNOSIS — O0993 Supervision of high risk pregnancy, unspecified, third trimester: Secondary | ICD-10-CM

## 2020-03-02 DIAGNOSIS — Z3A38 38 weeks gestation of pregnancy: Secondary | ICD-10-CM

## 2020-03-02 DIAGNOSIS — Z98891 History of uterine scar from previous surgery: Secondary | ICD-10-CM

## 2020-03-02 DIAGNOSIS — O10913 Unspecified pre-existing hypertension complicating pregnancy, third trimester: Secondary | ICD-10-CM

## 2020-03-02 NOTE — Progress Notes (Signed)
OBSTETRICS PRENATAL VIRTUAL VISIT ENCOUNTER NOTE  Provider location: Center for [facility] at [facility]   I connected with Monica [name] on 03/02/20 at  9:30 AM EDT by MyChart Video Encounter at home and verified that I am speaking with the correct person using two identifiers.   I discussed the limitations, risks, security and privacy concerns of performing an evaluation and management service virtually and the availability of in person appointments. I also discussed with the patient that there may be a patient responsible charge related to this service. The patient expressed understanding and agreed to proceed. Subjective:  Monica [name] is a 24 y.o. G3P2002 at [redacted]w[redacted]d being seen today for ongoing prenatal care.  She is currently monitored for the following issues for this high-risk pregnancy and has History of C-section; Hypertension; Obesity in pregnancy; H/O pre-eclampsia in prior pregnancy, currently pregnant; Chronic hypertension affecting pregnancy; Status post cesarean delivery; Supervision of high risk pregnancy, antepartum; Proteinuria affecting pregnancy; BMI 40.0-44.9, adult (HCC); and [redacted] weeks gestation of pregnancy on their problem list.  Patient reports no complaints.  She states she did have a lot of pain yesterday, but after resting feels it has improved.  She endorses fetal movement and contractions, but "nothing consistent."  She denies HA, visual disturbances, or RUQ pain.  She denies vaginal discharge.   Contractions: Not present. Vag. Bleeding: None.  Movement: Present. Denies any leaking of fluid.   The following portions of the patient's history were reviewed and updated as appropriate: allergies, current medications, past family history, past medical history, past social history, past surgical history and problem list.   Objective:   Vitals:   03/02/20 0920  BP: (!) 136/92  Pulse: (!) 107    Fetal Status:     Movement: Present     General:  Alert, oriented and  cooperative. Patient is in no acute distress.  Respiratory: Normal respiratory effort, no problems with respiration noted  Mental Status: Normal mood and affect. Normal behavior. Normal judgment and thought content.  Rest of physical exam deferred due to type of encounter  Imaging: [place] MFM OB FOLLOW UP  Result Date: 02/23/2020 ----------------------------------------------------------------------  OBSTETRICS REPORT                       (Signed Final 02/23/2020 10:05 am) ---------------------------------------------------------------------- Patient Info  ID #:       [id number]                          D.O.B.:  1995-07-06 (24 yrs)  Name:       Monica [name]                   Visit Date: 02/23/2020 09:15 am ---------------------------------------------------------------------- Performed By  Attending:        Ma Rings MD         Secondary Phy.:   [facility]  Performed By:     Lenise Arena        Address:          [address]                    [address]                                                             [address]                                                             [place]                                                             [id number]  Referred By:      Gigi GinPEGGY                  Location:         [facility]                    CONSTANT MD                              [facility]  Ref. Address:     Faculty ---------------------------------------------------------------------- Orders  #  Description                           Code        Ordered By  1  US MFM OB FOLLOW UP                   [id number]    Noralee SpaceAVI SHANKAR ----------------------------------------------------------------------  #  Order #                     Accession #                Episode #  1  [id number]                    [id number]                 [id number] ---------------------------------------------------------------------- Indications  Obesity complicating pregnancy, second         O99.212  trimester (BMI 43)  Encounter for other antenatal screening  Z36.2  follow-up (Low risk NIPS)  Hypertension - Chronic/Pre-existing            O10.019  Previous cesarean delivery, antepartum         O34.219  Poor obstetric history: Previous               O09.299  preeclampsia / eclampsia/gestational HTN  [redacted] weeks gestation of pregnancy                Z3A.37 ---------------------------------------------------------------------- Vital Signs                                                 Height:        5'1" ---------------------------------------------------------------------- Fetal Evaluation  Num Of Fetuses:         1  Fetal Heart Rate(bpm):  160  Cardiac Activity:       Observed  Presentation:           Cephalic  Placenta:               Anterior  P. Cord Insertion:      Previously Visualized  Amniotic Fluid  AFI FV:      Within normal limits  AFI Sum(cm)     %Tile       Largest Pocket(cm)  9.78            23          4.41  RUQ(cm)       RLQ(cm)       LUQ(cm)        LLQ(cm)  2.05          1.89          1.43           4.41 ---------------------------------------------------------------------- Biometry  BPD:      90.8  mm     G. Age:  36w 6d         55  %    CI:        74.08   %    70 - 86                                                          FL/HC:      21.3   %    20.8 - 22.6  HC:       335   mm     G. Age:  38w 2d         51  %    HC/AC:      0.93        0.92 - 1.05  AC:      361.4  mm     G. Age:  40w 0d       > 99  %    FL/BPD:     78.6   %    71 - 87  FL:       71.4  mm     G. Age:  36w 4d         32  %    FL/AC:      19.8   %    20 -  24  LV:        3.7  mm  Est. FW:    3571  gm    7 lb 14 oz      89  % ---------------------------------------------------------------------- OB History  Blood Type:   O+  Gravidity:    3          Term:   2        Prem:   0        SAB:   0  TOP:          0       Ectopic:  0        Living: 2 ---------------------------------------------------------------------- Gestational Age  LMP:           37w 2d        Date:  06/07/19                 EDD:   03/13/20  U/S Today:     38w 0d                                        EDD:   03/08/20  Best:          37w 2d     Det. By:  LMP  (06/07/19)          EDD:   03/13/20 ---------------------------------------------------------------------- Anatomy  Cranium:               Appears normal         Aortic Arch:            Previously seen  Cavum:                 Appears normal         Ductal Arch:            Previously seen  Ventricles:            Appears normal         Diaphragm:              Previously seen  Choroid Plexus:        Previously seen        Stomach:                Appears normal, left                                                                        sided  Cerebellum:            Previously seen        Abdomen:                Appears normal  Posterior Fossa:       Previously seen        Abdominal Wall:         Previously seen  Nuchal Fold:           Previously seen        Cord Vessels:           Previously seen  Face:  Orbits and profile     Kidneys:                Appear normal                         previously seen  Lips:                  Previously seen        Bladder:                Appears normal  Thoracic:              Appears normal         Spine:                  Limited views                                                                        previously seen  Heart:                 Previously seen        Upper Extremities:      Previously seen  RVOT:                  Previously seen        Lower Extremities:      Previously seen  LVOT:                  Previously seen  Other:  Fetus appears to be a female prev seen.  Technically difficult due to          maternal habitus and fetal position.  ---------------------------------------------------------------------- Cervix Uterus Adnexa  Cervix  Not visualized (advanced GA >24wks) ---------------------------------------------------------------------- Comments  This patient was seen for a follow up growth scan due to  maternal obesity and chronic hypertension that is not  currently treated with any medications.  She denies any  problems since her last exam.  She was informed that the fetal growth and amniotic fluid  level appears appropriate for her gestational age.  As the fetal growth is within normal limits, no further exams  were scheduled in our office. ----------------------------------------------------------------------                   Ma Rings, MD Electronically Signed Final Report   02/23/2020 10:05 am ----------------------------------------------------------------------   Assessment and Plan:  Pregnancy: [id number] at [redacted]w[redacted]d 1. Supervision of high risk pregnancy, antepartum -Patient states FOB will be receiving a vasectomy for birth control method. -Encouraged to rest as much as possible prior to delivery.  -Discussed follow up appt one week after delivery for incision and blood pressure check.   2. History of C-section -Repeat scheduled for 10/12 with Dr. Manus Rudd  3. Chronic hypertension affecting pregnancy -Elevated bp today, but not severe range. -Patient asymptomatic. -Instructed to monitor blood pressures and report with any ./=160/100.  Term labor symptoms and general obstetric precautions including but not limited to vaginal bleeding, contractions, leaking of fluid and fetal movement were reviewed in detail with the patient. I discussed the assessment and treatment plan with the patient. The patient was provided an opportunity to  ask questions and all were answered. The patient agreed with the plan and demonstrated an understanding of the instructions. The patient was advised to call back or seek an in-person office  evaluation/go to MAU at [facility] for any urgent or concerning symptoms. Please refer to After Visit Summary for other counseling recommendations.   I provided 6 minutes of face-to-face time during this encounter.  No follow-ups on file.  Future Appointments  Date Time Provider Department Center  03/04/2020  8:20 AM [facility]U 1 [facility] None    Cherre Robins, CNM Center for [employer], [facility]  .[facility]

## 2020-03-04 ENCOUNTER — Other Ambulatory Visit: Payer: Self-pay

## 2020-03-04 ENCOUNTER — Other Ambulatory Visit (HOSPITAL_COMMUNITY)
Admission: RE | Admit: 2020-03-04 | Discharge: 2020-03-04 | Disposition: A | Discharge status: Home / Self Care | Payer: Medicaid Other | Source: Ambulatory Visit | Attending: Obstetrics & Gynecology | Admitting: Obstetrics & Gynecology

## 2020-03-04 DIAGNOSIS — Z20822 Contact with and (suspected) exposure to covid-19: Secondary | ICD-10-CM | POA: Insufficient documentation

## 2020-03-04 LAB — CBC
HCT: 35.3 % — ABNORMAL LOW (ref 36.0–46.0)
Hemoglobin: 10.9 g/dL — ABNORMAL LOW (ref 12.0–15.0)
MCH: 22.7 pg — ABNORMAL LOW (ref 26.0–34.0)
MCHC: 30.9 g/dL (ref 30.0–36.0)
MCV: 73.4 fL — ABNORMAL LOW (ref 80.0–100.0)
Platelets: 281 10*3/uL (ref 150–400)
RBC: 4.81 MIL/uL (ref 3.87–5.11)
RDW: 14.9 % (ref 11.5–15.5)
WBC: 9.8 10*3/uL (ref 4.0–10.5)
nRBC: 0.2 % (ref 0.0–0.2)

## 2020-03-04 LAB — RESPIRATORY PANEL BY RT PCR (FLU A&B, COVID)
Influenza A by PCR: NEGATIVE
Influenza B by PCR: NEGATIVE
SARS Coronavirus 2 by RT PCR: NEGATIVE

## 2020-03-04 LAB — TYPE AND SCREEN
ABO/RH(D): O POS
Antibody Screen: NEGATIVE

## 2020-03-04 LAB — RPR: RPR Ser Ql: NONREACTIVE

## 2020-03-04 NOTE — MAU Note (Signed)
Pt here for covid swab and lab draw. Denies symptoms or sick contacts. Swab collected.  

## 2020-03-06 ENCOUNTER — Inpatient Hospital Stay (HOSPITAL_COMMUNITY): Payer: Medicaid Other | Admitting: Anesthesiology

## 2020-03-06 ENCOUNTER — Encounter (HOSPITAL_COMMUNITY): Payer: Self-pay | Admitting: Family Medicine

## 2020-03-06 ENCOUNTER — Other Ambulatory Visit: Payer: Self-pay

## 2020-03-06 ENCOUNTER — Inpatient Hospital Stay (HOSPITAL_COMMUNITY)
Admission: RE | Admit: 2020-03-06 | Discharge: 2020-03-08 | DRG: 787 | Disposition: A | Discharge status: Home / Self Care | Payer: Medicaid Other | Attending: Family Medicine | Admitting: Family Medicine

## 2020-03-06 ENCOUNTER — Encounter (HOSPITAL_COMMUNITY)
Admission: RE | Disposition: A | Discharge status: Home / Self Care | Payer: Self-pay | Source: Home / Self Care | Attending: Family Medicine

## 2020-03-06 DIAGNOSIS — I10 Essential (primary) hypertension: Secondary | ICD-10-CM | POA: Diagnosis present

## 2020-03-06 DIAGNOSIS — O99214 Obesity complicating childbirth: Secondary | ICD-10-CM | POA: Diagnosis not present

## 2020-03-06 DIAGNOSIS — O1002 Pre-existing essential hypertension complicating childbirth: Secondary | ICD-10-CM | POA: Diagnosis not present

## 2020-03-06 DIAGNOSIS — Z98891 History of uterine scar from previous surgery: Secondary | ICD-10-CM

## 2020-03-06 DIAGNOSIS — O10919 Unspecified pre-existing hypertension complicating pregnancy, unspecified trimester: Secondary | ICD-10-CM | POA: Diagnosis present

## 2020-03-06 DIAGNOSIS — O09299 Supervision of pregnancy with other poor reproductive or obstetric history, unspecified trimester: Secondary | ICD-10-CM

## 2020-03-06 DIAGNOSIS — Z3A39 39 weeks gestation of pregnancy: Secondary | ICD-10-CM

## 2020-03-06 DIAGNOSIS — O34211 Maternal care for low transverse scar from previous cesarean delivery: Secondary | ICD-10-CM

## 2020-03-06 DIAGNOSIS — Z6841 Body Mass Index (BMI) 40.0 and over, adult: Secondary | ICD-10-CM

## 2020-03-06 DIAGNOSIS — O0990 Supervision of high risk pregnancy, unspecified, unspecified trimester: Secondary | ICD-10-CM

## 2020-03-06 DIAGNOSIS — O1092 Unspecified pre-existing hypertension complicating childbirth: Secondary | ICD-10-CM | POA: Diagnosis not present

## 2020-03-06 DIAGNOSIS — O1210 Gestational proteinuria, unspecified trimester: Secondary | ICD-10-CM | POA: Diagnosis present

## 2020-03-06 DIAGNOSIS — O99210 Obesity complicating pregnancy, unspecified trimester: Secondary | ICD-10-CM | POA: Diagnosis present

## 2020-03-06 SURGERY — Surgical Case
Anesthesia: Spinal

## 2020-03-06 MED ORDER — FENTANYL CITRATE (PF) 100 MCG/2ML IJ SOLN
INTRAMUSCULAR | Status: DC | PRN
  Administered 2020-03-06: 15 ug via INTRATHECAL

## 2020-03-06 MED ORDER — MENTHOL 3 MG MT LOZG: 1.0000 | LOZENGE | OROMUCOSAL | Status: DC | PRN

## 2020-03-06 MED ORDER — SIMETHICONE 80 MG PO CHEW
80.0000 mg | CHEWABLE_TABLET | ORAL | Status: DC
  Administered 2020-03-06 – 2020-03-07 (×2): 80 mg via ORAL
  Filled 2020-03-06 (×2): qty 1

## 2020-03-06 MED ORDER — PRENATAL MULTIVITAMIN CH
1.0000 | ORAL_TABLET | Freq: Every day | ORAL | Status: DC
  Administered 2020-03-07 – 2020-03-08 (×2): 1 via ORAL
  Filled 2020-03-06 (×2): qty 1

## 2020-03-06 MED ORDER — OXYCODONE HCL 5 MG PO TABS: 5.0000 mg | ORAL_TABLET | Freq: Once | ORAL | Status: DC | PRN

## 2020-03-06 MED ORDER — DIPHENHYDRAMINE HCL 50 MG/ML IJ SOLN: 12.5000 mg | INTRAMUSCULAR | Status: DC | PRN

## 2020-03-06 MED ORDER — TETANUS-DIPHTH-ACELL PERTUSSIS 5-2.5-18.5 LF-MCG/0.5 IM SUSP: 0.5000 mL | Freq: Once | INTRAMUSCULAR | Status: DC

## 2020-03-06 MED ORDER — LACTATED RINGERS IV SOLN: INTRAVENOUS | Status: DC | PRN

## 2020-03-06 MED ORDER — PHENYLEPHRINE HCL-NACL 20-0.9 MG/250ML-% IV SOLN
INTRAVENOUS | Status: DC | PRN
  Administered 2020-03-06: 60 ug/min via INTRAVENOUS

## 2020-03-06 MED ORDER — KETOROLAC TROMETHAMINE 30 MG/ML IJ SOLN
30.0000 mg | Freq: Four times a day (QID) | INTRAMUSCULAR | Status: AC
  Administered 2020-03-06 – 2020-03-07 (×3): 30 mg via INTRAVENOUS
  Filled 2020-03-06 (×3): qty 1

## 2020-03-06 MED ORDER — ZOLPIDEM TARTRATE 5 MG PO TABS: 5.0000 mg | ORAL_TABLET | Freq: Every evening | ORAL | Status: DC | PRN

## 2020-03-06 MED ORDER — PHENYLEPHRINE HCL-NACL 20-0.9 MG/250ML-% IV SOLN
INTRAVENOUS | Status: AC
  Filled 2020-03-06: qty 250

## 2020-03-06 MED ORDER — KETOROLAC TROMETHAMINE 30 MG/ML IJ SOLN
30.0000 mg | Freq: Four times a day (QID) | INTRAMUSCULAR | Status: DC | PRN
  Administered 2020-03-06: 30 mg via INTRAVENOUS

## 2020-03-06 MED ORDER — MORPHINE SULFATE (PF) 0.5 MG/ML IJ SOLN
INTRAMUSCULAR | Status: AC
  Filled 2020-03-06: qty 10

## 2020-03-06 MED ORDER — ENOXAPARIN SODIUM 60 MG/0.6ML ~~LOC~~ SOLN
60.0000 mg | SUBCUTANEOUS | Status: DC
  Administered 2020-03-07: 60 mg via SUBCUTANEOUS
  Filled 2020-03-06: qty 0.6

## 2020-03-06 MED ORDER — ACETAMINOPHEN 160 MG/5ML PO SOLN: 325.0000 mg | ORAL | Status: DC | PRN

## 2020-03-06 MED ORDER — NALBUPHINE HCL 10 MG/ML IJ SOLN
5.0000 mg | Freq: Once | INTRAMUSCULAR | Status: AC | PRN
  Administered 2020-03-06: 5 mg via INTRAVENOUS
  Filled 2020-03-06: qty 1

## 2020-03-06 MED ORDER — FENTANYL CITRATE (PF) 100 MCG/2ML IJ SOLN
INTRAMUSCULAR | Status: AC
  Filled 2020-03-06: qty 2

## 2020-03-06 MED ORDER — DIPHENHYDRAMINE HCL 25 MG PO CAPS
25.0000 mg | ORAL_CAPSULE | ORAL | Status: DC | PRN
  Filled 2020-03-06: qty 1

## 2020-03-06 MED ORDER — OXYCODONE HCL 5 MG/5ML PO SOLN: 5.0000 mg | Freq: Once | ORAL | Status: DC | PRN

## 2020-03-06 MED ORDER — SODIUM CHLORIDE 0.9 % IV SOLN: INTRAVENOUS | Status: DC | PRN

## 2020-03-06 MED ORDER — ACETAMINOPHEN 325 MG PO TABS: 325.0000 mg | ORAL_TABLET | ORAL | Status: DC | PRN

## 2020-03-06 MED ORDER — ONDANSETRON HCL 4 MG/2ML IJ SOLN
INTRAMUSCULAR | Status: AC
  Filled 2020-03-06: qty 2

## 2020-03-06 MED ORDER — SCOPOLAMINE 1 MG/3DAYS TD PT72
1.0000 | MEDICATED_PATCH | Freq: Once | TRANSDERMAL | Status: DC
  Administered 2020-03-06: 1.5 mg via TRANSDERMAL

## 2020-03-06 MED ORDER — NALBUPHINE HCL 10 MG/ML IJ SOLN: 5.0000 mg | Freq: Once | INTRAMUSCULAR | Status: AC | PRN

## 2020-03-06 MED ORDER — SODIUM CHLORIDE 0.9% FLUSH: 3.0000 mL | INTRAVENOUS | Status: DC | PRN

## 2020-03-06 MED ORDER — OXYCODONE-ACETAMINOPHEN 5-325 MG PO TABS: 1.0000 | ORAL_TABLET | ORAL | Status: DC | PRN

## 2020-03-06 MED ORDER — CEFAZOLIN SODIUM-DEXTROSE 2-4 GM/100ML-% IV SOLN: 2.0000 g | INTRAVENOUS | Status: DC

## 2020-03-06 MED ORDER — CEFAZOLIN SODIUM-DEXTROSE 2-3 GM-%(50ML) IV SOLR
INTRAVENOUS | Status: DC | PRN
  Administered 2020-03-06: 2 g via INTRAVENOUS

## 2020-03-06 MED ORDER — SIMETHICONE 80 MG PO CHEW: 80.0000 mg | CHEWABLE_TABLET | ORAL | Status: DC | PRN

## 2020-03-06 MED ORDER — SODIUM CHLORIDE 0.9 % IR SOLN
Status: DC | PRN
  Administered 2020-03-06: 1

## 2020-03-06 MED ORDER — NALOXONE HCL 4 MG/10ML IJ SOLN
1.0000 ug/kg/h | INTRAVENOUS | Status: DC | PRN
  Filled 2020-03-06: qty 5

## 2020-03-06 MED ORDER — SIMETHICONE 80 MG PO CHEW
80.0000 mg | CHEWABLE_TABLET | Freq: Three times a day (TID) | ORAL | Status: DC
  Administered 2020-03-06 – 2020-03-08 (×5): 80 mg via ORAL
  Filled 2020-03-06 (×5): qty 1

## 2020-03-06 MED ORDER — SENNOSIDES-DOCUSATE SODIUM 8.6-50 MG PO TABS
2.0000 | ORAL_TABLET | ORAL | Status: DC
  Administered 2020-03-06 – 2020-03-07 (×2): 2 via ORAL
  Filled 2020-03-06 (×2): qty 2

## 2020-03-06 MED ORDER — SCOPOLAMINE 1 MG/3DAYS TD PT72
MEDICATED_PATCH | TRANSDERMAL | Status: AC
  Filled 2020-03-06: qty 1

## 2020-03-06 MED ORDER — STERILE WATER FOR IRRIGATION IR SOLN
Status: DC | PRN
  Administered 2020-03-06: 1000 mL

## 2020-03-06 MED ORDER — NALBUPHINE HCL 10 MG/ML IJ SOLN: 5.0000 mg | INTRAMUSCULAR | Status: DC | PRN

## 2020-03-06 MED ORDER — MEPERIDINE HCL 25 MG/ML IJ SOLN: 6.2500 mg | INTRAMUSCULAR | Status: DC | PRN

## 2020-03-06 MED ORDER — NALOXONE HCL 0.4 MG/ML IJ SOLN: 0.4000 mg | INTRAMUSCULAR | Status: DC | PRN

## 2020-03-06 MED ORDER — LACTATED RINGERS IV SOLN: INTRAVENOUS | Status: DC

## 2020-03-06 MED ORDER — KETOROLAC TROMETHAMINE 30 MG/ML IJ SOLN: 30.0000 mg | Freq: Four times a day (QID) | INTRAMUSCULAR | Status: DC | PRN

## 2020-03-06 MED ORDER — ONDANSETRON HCL 4 MG/2ML IJ SOLN: 4.0000 mg | Freq: Once | INTRAMUSCULAR | Status: DC | PRN

## 2020-03-06 MED ORDER — FENTANYL CITRATE (PF) 100 MCG/2ML IJ SOLN: 25.0000 ug | INTRAMUSCULAR | Status: DC | PRN

## 2020-03-06 MED ORDER — OXYTOCIN-SODIUM CHLORIDE 30-0.9 UT/500ML-% IV SOLN
INTRAVENOUS | Status: DC | PRN
  Administered 2020-03-06: 30 [IU] via INTRAVENOUS

## 2020-03-06 MED ORDER — IBUPROFEN 800 MG PO TABS
800.0000 mg | ORAL_TABLET | Freq: Four times a day (QID) | ORAL | Status: DC
  Administered 2020-03-07 – 2020-03-08 (×4): 800 mg via ORAL
  Filled 2020-03-06 (×4): qty 1

## 2020-03-06 MED ORDER — MORPHINE SULFATE (PF) 0.5 MG/ML IJ SOLN
INTRAMUSCULAR | Status: DC | PRN
  Administered 2020-03-06: 150 ug via EPIDURAL

## 2020-03-06 MED ORDER — COCONUT OIL OIL
1.0000 "application " | TOPICAL_OIL | Status: DC | PRN
  Administered 2020-03-07: 1 via TOPICAL

## 2020-03-06 MED ORDER — OXYTOCIN-SODIUM CHLORIDE 30-0.9 UT/500ML-% IV SOLN: 2.5000 [IU]/h | INTRAVENOUS | Status: AC

## 2020-03-06 MED ORDER — ONDANSETRON HCL 4 MG/2ML IJ SOLN: 4.0000 mg | Freq: Three times a day (TID) | INTRAMUSCULAR | Status: DC | PRN

## 2020-03-06 MED ORDER — LACTATED RINGERS IV SOLN
125.0000 mL/h | INTRAVENOUS | Status: DC
  Administered 2020-03-06: 125 mL/h via INTRAVENOUS

## 2020-03-06 MED ORDER — WITCH HAZEL-GLYCERIN EX PADS: 1.0000 "application " | MEDICATED_PAD | CUTANEOUS | Status: DC | PRN

## 2020-03-06 MED ORDER — DIBUCAINE (PERIANAL) 1 % EX OINT: 1.0000 "application " | TOPICAL_OINTMENT | CUTANEOUS | Status: DC | PRN

## 2020-03-06 MED ORDER — DIPHENHYDRAMINE HCL 25 MG PO CAPS
25.0000 mg | ORAL_CAPSULE | Freq: Four times a day (QID) | ORAL | Status: DC | PRN
  Administered 2020-03-06: 25 mg via ORAL

## 2020-03-06 MED ORDER — ONDANSETRON HCL 4 MG/2ML IJ SOLN
INTRAMUSCULAR | Status: DC | PRN
  Administered 2020-03-06: 4 mg via INTRAVENOUS

## 2020-03-06 MED ORDER — BUPIVACAINE IN DEXTROSE 0.75-8.25 % IT SOLN
INTRATHECAL | Status: DC | PRN
  Administered 2020-03-06: 1.5 mL via INTRATHECAL

## 2020-03-06 MED ORDER — CEFAZOLIN SODIUM-DEXTROSE 2-4 GM/100ML-% IV SOLN
INTRAVENOUS | Status: AC
  Filled 2020-03-06: qty 100

## 2020-03-06 SURGICAL SUPPLY — 43 items
BENZOIN TINCTURE PRP APPL 2/3 (GAUZE/BANDAGES/DRESSINGS) ×3 IMPLANT
CHLORAPREP W/TINT 26ML (MISCELLANEOUS) ×3 IMPLANT
CLAMP CORD UMBIL (MISCELLANEOUS) IMPLANT
CLOSURE STERI-STRIP 1/2X4 (GAUZE/BANDAGES/DRESSINGS) ×1
CLOSURE WOUND 1/2 X4 (GAUZE/BANDAGES/DRESSINGS) ×1
CLOTH BEACON ORANGE TIMEOUT ST (SAFETY) ×3 IMPLANT
CLSR STERI-STRIP ANTIMIC 1/2X4 (GAUZE/BANDAGES/DRESSINGS) ×2 IMPLANT
DRSG OPSITE POSTOP 4X10 (GAUZE/BANDAGES/DRESSINGS) ×3 IMPLANT
ELECT REM PT RETURN 9FT ADLT (ELECTROSURGICAL) ×3
ELECTRODE REM PT RTRN 9FT ADLT (ELECTROSURGICAL) ×1 IMPLANT
EXTRACTOR VACUUM M CUP 4 TUBE (SUCTIONS) IMPLANT
EXTRACTOR VACUUM M CUP 4' TUBE (SUCTIONS)
GLOVE BIO SURGEON STRL SZ 6 (GLOVE) ×3 IMPLANT
GLOVE BIOGEL PI IND STRL 6 (GLOVE) ×1 IMPLANT
GLOVE BIOGEL PI IND STRL 7.0 (GLOVE) ×2 IMPLANT
GLOVE BIOGEL PI IND STRL 7.5 (GLOVE) ×2 IMPLANT
GLOVE BIOGEL PI INDICATOR 6 (GLOVE) ×2
GLOVE BIOGEL PI INDICATOR 7.0 (GLOVE) ×4
GLOVE BIOGEL PI INDICATOR 7.5 (GLOVE) ×4
GLOVE ECLIPSE 7.5 STRL STRAW (GLOVE) ×3 IMPLANT
GOWN STRL REUS W/TWL LRG LVL3 (GOWN DISPOSABLE) ×9 IMPLANT
KIT ABG SYR 3ML LUER SLIP (SYRINGE) IMPLANT
NEEDLE HYPO 25X5/8 SAFETYGLIDE (NEEDLE) IMPLANT
NS IRRIG 1000ML POUR BTL (IV SOLUTION) ×3 IMPLANT
PACK C SECTION WH (CUSTOM PROCEDURE TRAY) ×3 IMPLANT
PAD ABD 8X10 STRL (GAUZE/BANDAGES/DRESSINGS) ×3 IMPLANT
PAD OB MATERNITY 4.3X12.25 (PERSONAL CARE ITEMS) ×3 IMPLANT
PENCIL SMOKE EVAC W/HOLSTER (ELECTROSURGICAL) ×3 IMPLANT
RETRACTOR TRAXI PANNICULUS (MISCELLANEOUS) ×1 IMPLANT
RTRCTR C-SECT PINK 25CM LRG (MISCELLANEOUS) ×3 IMPLANT
SPONGE GAUZE 4X4 12PLY STER LF (GAUZE/BANDAGES/DRESSINGS) ×3 IMPLANT
STRIP CLOSURE SKIN 1/2X4 (GAUZE/BANDAGES/DRESSINGS) ×2 IMPLANT
SUT MON AB-0 CT1 36 (SUTURE) ×3 IMPLANT
SUT PLAIN 2 0 XLH (SUTURE) ×3 IMPLANT
SUT VIC AB 0 CTX 36 (SUTURE) ×9
SUT VIC AB 0 CTX36XBRD ANBCTRL (SUTURE) ×3 IMPLANT
SUT VIC AB 2-0 CT1 27 (SUTURE) ×3
SUT VIC AB 2-0 CT1 TAPERPNT 27 (SUTURE) ×1 IMPLANT
SUT VIC AB 4-0 KS 27 (SUTURE) ×3 IMPLANT
TOWEL OR 17X24 6PK STRL BLUE (TOWEL DISPOSABLE) ×3 IMPLANT
TRAXI PANNICULUS RETRACTOR (MISCELLANEOUS) ×2
TRAY FOLEY W/BAG SLVR 14FR LF (SET/KITS/TRAYS/PACK) ×3 IMPLANT
WATER STERILE IRR 1000ML POUR (IV SOLUTION) ×3 IMPLANT

## 2020-03-06 NOTE — H&P (Signed)
OBSTETRIC ADMISSION HISTORY AND PHYSICAL  Monica [name] is a 24 y.o. female [id number] with IUP at 54w0dby LMP presenting for elective rLTCS. She reports +FMs, No LOF, no VB, no blurry vision, headaches or peripheral edema, and RUQ pain.  She plans on breast and bottle feeding. She is undecided for birth control. [facility] --->  Estimated Date of Delivery: 03/13/20  Sono:    02/23/20@[redacted]w[redacted]d , CWD, normal anatomy, cephalic presentation, 32956O 89% EFW   Prenatal History/Complications:  cHTN (not on meds) History of preE in prior pregnancy History of c-section x1 (G1-failure to progress, G2-fetal macrosomia) Obesity (BMI 47)  Past Medical History: Past Medical History:  Diagnosis Date   Anxiety    Depression    Headache    Hypertension    Pregnancy induced hypertension    Vaginal Pap smear, abnormal     Past Surgical History: Past Surgical History:  Procedure Laterality Date   arthoscopic knee Right 2013   CESAREAN SECTION N/A 11/05/2012   Procedure: CESAREAN SECTION;  Surgeon: LLahoma Crocker MD;  [facility];  Service: Obstetrics;  Laterality: N/A;   CESAREAN SECTION N/A 04/02/2018   Procedure: CESAREAN SECTION;  Surgeon: LGuss Bunde MD;  [facility]  Service: Obstetrics;  Laterality: N/A;   COLPOSCOPY  05/07/2016   knee srthoscopy N/A     Obstetrical History: OB History    Gravida  3   Para  2   Term  2   Preterm      AB      Living  2     SAB      TAB      Ectopic      Multiple  0   Live Births  2           Social History Social History   Socioeconomic History   Marital status: Single    Spouse name: Not on file   Number of children: Not on file   Years of education: Not on file   Highest education level: Not on file  Occupational History   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  Tobacco Use   Smoking  status: Never Smoker   Smokeless tobacco: Never Used  VScientific laboratory technicianUse: Former  Substance and Sexual Activity   Alcohol use: Not Currently    Alcohol/week: 0.0 standard drinks    Comment: sometimes    Drug use: No   Sexual activity: Yes    Partners: Male    Birth control/protection: None  Other Topics Concern   Not on file  Social History Narrative   Not on file   Social Determinants of Health   Financial Resource Strain:    Difficulty of Paying Living Expenses: Not on file  Food Insecurity:    Worried About RCharity fundraiserin the Last Year: Not on file   RYRC Worldwideof Food in the Last Year: Not on file  Transportation Needs:    Lack of Transportation (Medical): Not on file   Lack of Transportation (Non-Medical): Not on file  Physical Activity:    Days of Exercise per Week: Not on file   Minutes of Exercise per Session: Not on file  Stress:    Feeling of Stress : Not on file  Social Connections:    Frequency of Communication with Friends and Family: Not on file   Frequency  of Social Gatherings with Friends and Family: Not on file   Attends Religious Services: Not on file   Active Member of Clubs or Organizations: Not on file   Attends Archivist Meetings: Not on file   Marital Status: Not on file    Family History: Family History  Problem Relation Age of Onset   Hypertension Mother    Hypertension Father    Heart attack Maternal Grandmother    Hypertension Maternal Grandmother    Hearing loss Maternal Grandmother    Kidney disease Maternal Grandmother    Healthy Brother        x1   Asthma Brother    Healthy Sister        x1`   Intellectual disability Maternal Aunt    Cancer Neg Hx    Diabetes Neg Hx     Allergies: Allergies  Allergen Reactions   Iodine Rash   Shellfish Allergy Hives and Rash    Medications Prior to Admission  Medication Sig Dispense Refill Last Dose   aspirin EC 81 MG tablet  Take 1 tablet (81 mg total) by mouth daily. Take after 12 weeks for prevention of preeclampsia later in pregnancy 300 tablet 2 Past Week at Unknown time   cetirizine (ZYRTEC) 10 MG tablet Take 10 mg by mouth daily as needed for allergies.    03/05/2020 at Unknown time   Polyethyl Glycol-Propyl Glycol (LUBRICANT EYE DROPS) 0.4-0.3 % SOLN Place 1 drop into both eyes 3 (three) times daily as needed (for dry/irritated eyes.).       Prenatal Vit-Fe Phos-FA-Omega (VITAFOL GUMMIES) 3.33-0.333-34.8 MG CHEW Chew 2 tablets by mouth daily.   03/05/2020 at Unknown time   Blood Pressure Monitor KIT 1 Device by Does not apply route once a week. To be monitored Regularly at home. 1 kit 0      Review of Systems   All systems reviewed and negative except as stated in HPI  Blood pressure 111/79, temperature 98.3 F (36.8 C), temperature source Oral, resp. rate 18, height 5' 1"  (1.549 m), weight 112 kg, last menstrual period 06/07/2019, SpO2 95 %, currently breastfeeding. General appearance: alert, cooperative and no distress Lungs: normal respiratory effort Heart: regular rate and rhythm Abdomen: soft, non-tender; gravid Pelvic: not indicated Extremities: Homans sign is negative, no sign of DVT  Prenatal labs: ABO, Rh: --/--/O POS (10/10 0846) Antibody: NEG (10/10 0846) Rubella: 4.20 (04/15 1007) RPR: NON REACTIVE (10/10 0846)  HBsAg: Negative (04/15 1007)  HIV: Non Reactive (08/10 0903)  GBS: Negative/-- (09/22 0457)  2 hr Glucola passed Genetic screening  Low risk Anatomy US limited but normal, f/u normal  Prenatal Transfer Tool  Maternal Diabetes: No Genetic Screening: Normal Maternal Ultrasounds/Referrals: Normal Fetal Ultrasounds or other Referrals:  None Maternal Substance Abuse:  No Significant Maternal Medications:  None Significant Maternal Lab Results: None  No results found for this or any previous visit (from the past 24 hour(s)).  Patient Active Problem List   Diagnosis  Date Noted   Status post cesarean section 03/06/2020   BMI 40.0-44.9, adult ([place]) 02/15/2020   Proteinuria affecting pregnancy 10/06/2019   Supervision of high risk pregnancy, antepartum 08/03/2019   Status post cesarean delivery 04/02/2018   Chronic hypertension affecting pregnancy 12/08/2017   H/O pre-eclampsia in prior pregnancy, currently pregnant 11/10/2017   History of C-section 09/15/2017   Hypertension 09/15/2017   Obesity in pregnancy 09/15/2017    Assessment/Plan:  Monica [name] is a 24 y.o. G3P2002 at [redacted]w[redacted]d  here for elective rLTCS.  #cesarean section: The risks of cesarean section were discussed with the patient including but were not limited to: bleeding which may require transfusion or reoperation; infection which may require antibiotics; injury to bowel, bladder, ureters or other surrounding organs; injury to the fetus; need for additional procedures including hysterectomy in the event of a life-threatening hemorrhage; placental abnormalities wth subsequent pregnancies, incisional problems, thromboembolic phenomenon and other postoperative/anesthesia complications.The patient concurred with the proposed plan, giving informed written consent for the procedures.  Patient has been NPO since 0000 she will remain NPO for procedure. Anesthesia and OR aware.  Preoperative prophylactic antibiotics and SCDs ordered on call to the OR.  To OR when ready.  #Pain: spinal #ID: GBS neg, ancef intra-op #MOF: both #MOC: undecided #Circ: yes #cHTN: not on meds. Asymptomatic. BP normal in pre-op.  Arrie Senate, MD  03/06/2020, 12:32 PM

## 2020-03-06 NOTE — Op Note (Signed)
[name] PROCEDURE DATE: 03/06/2020  PREOPERATIVE DIAGNOSIS: Intrauterine pregnancy at  [redacted]w[redacted]d weeks gestation; elective repeat  POSTOPERATIVE DIAGNOSIS: The same  PROCEDURE: repeat Low Transverse Cesarean Section  SURGEON:  Dr. Candelaria Celeste  ASSISTANT: Dr Mart Piggs  INDICATIONS: Monica [name] is a 24 y.o. [id number] at [redacted]w[redacted]d scheduled for cesarean section secondary to elective repeat.  The risks of cesarean section discussed with the patient included but were not limited to: bleeding which may require transfusion or reoperation; infection which may require antibiotics; injury to bowel, bladder, ureters or other surrounding organs; injury to the fetus; need for additional procedures including hysterectomy in the event of a life-threatening hemorrhage; placental abnormalities wth subsequent pregnancies, incisional problems, thromboembolic phenomenon and other postoperative/anesthesia complications. The patient concurred with the proposed plan, giving informed written consent for the procedure.    FINDINGS:  Viable female infant in cephalic presentation.  Apgars 9 and 9, weight, 4031g.  Clear amniotic fluid.  Intact placenta, three vessel cord.  Normal uterus, fallopian tubes and ovaries bilaterally.  ANESTHESIA:    Spinal INTRAVENOUS FLUIDS:2000 ml ESTIMATED BLOOD LOSS: 250 ml URINE OUTPUT:  400 ml SPECIMENS: Placenta sent to L&D COMPLICATIONS: None immediate  PROCEDURE IN DETAIL:  The patient received intravenous antibiotics and had sequential compression devices applied to her lower extremities while in the preoperative area.  She was then taken to the operating room where spinal anesthesia was administered and was found to be adequate. She was then placed in a dorsal supine position with a leftward tilt, and prepped and draped in a sterile manner.  A foley catheter was placed into her bladder and attached to constant gravity, which drained clear fluid  throughout.  After an adequate timeout was performed, a Pfannenstiel skin incision was made with scalpel and carried through to the underlying layer of fascia. The fascia was incised in the midline and this incision was extended bilaterally using the Mayo scissors. Kocher clamps were applied to the superior aspect of the fascial incision and the underlying rectus muscles were dissected off bluntly and sharply. A similar process was carried out on the inferior aspect of the facial incision. The rectus muscles were separated in the midline bluntly and sharply and the peritoneum was entered bluntly. An Alexis retractor was placed to aid in visualization of the uterus.  Attention was turned to the lower uterine segment where a transverse hysterotomy was made with a scalpel and extended bilaterally bluntly. Due to difficulty delivering the infant's head the alexis retractor was removed and the left rectus muscle was cut using the bovie. The infant was successfully delivered, and cord was clamped and cut and infant was handed over to awaiting neonatology team. Uterine massage was then administered and the placenta delivered intact with three-vessel cord. The uterus was then cleared of clot and debris.  The hysterotomy was closed with 0 Monocryl in a running locked fashion, and an imbricating layer was also placed with a 0 Monocryl. Overall, excellent hemostasis was noted. The abdomen and the pelvis were cleared of all clot and debris. Hemostasis was confirmed on all surfaces.  The peritoneum was reapproximated using 2-0 vicryl running stitches. The fascia was then closed using 0 Vicryl in a running fashion. The subcutaneous layer was reapproximated with plain gut and the skin was closed with 4-0 vicryl. The patient tolerated the procedure well. Sponge, lap, instrument and needle counts were correct x 2. She was taken to the recovery room in stable condition.  Alric Seton, MD 03/06/2020 5:35 PM

## 2020-03-06 NOTE — Transfer of Care (Signed)
Immediate Anesthesia Transfer of Care Note  Patient: Monica [name]  Procedure(s) Performed: CESAREAN SECTION (N/A )  Patient Location: PACU  Anesthesia Type:Spinal  Level of Consciousness: awake  Airway & Oxygen Therapy: Patient Spontanous Breathing  Post-op Assessment: Report given to RN and Post -op Vital signs reviewed and stable  Post vital signs: Reviewed and stable  Last Vitals:  Vitals Value Taken Time  BP 114/48 03/06/20 1430  Temp    Pulse 86 03/06/20 1431  Resp 25 03/06/20 1431  SpO2 100 % 03/06/20 1431  Vitals shown include unvalidated device data.  Last Pain:  Vitals:   03/06/20 1149  TempSrc: Oral         Complications: No complications documented.

## 2020-03-06 NOTE — Anesthesia Procedure Notes (Signed)
Spinal  Patient location during procedure: OR Start time: 03/06/2020 1:09 PM End time: 03/06/2020 1:13 PM Staffing Anesthesiologist: Bethena Midget, MD Preanesthetic Checklist Completed: patient identified, IV checked, site marked, risks and benefits discussed, surgical consent, monitors and equipment checked, pre-op evaluation and timeout performed Spinal Block Patient position: sitting Prep: DuraPrep Patient monitoring: heart rate, cardiac monitor, continuous pulse ox and blood pressure Approach: midline Location: L3-4 Injection technique: single-shot Needle Needle type: Sprotte  Needle gauge: 24 G Needle length: 9 cm Assessment Sensory level: T4

## 2020-03-06 NOTE — Discharge Instructions (Signed)

## 2020-03-06 NOTE — Discharge Summary (Signed)
Postpartum Discharge Summary   Patient Name: Monica [name] DOB: 08-27-1995 MRN: [id number]  Date of admission: 03/06/2020 Delivery date:03/06/2020  Delivering provider: Arrie Senate  Date of discharge: 03/08/2020  Admitting diagnosis: Status post cesarean section [Z98.891] Intrauterine pregnancy: [redacted]w[redacted]d    Secondary diagnosis:  Active Problems:   History of C-section   Hypertension   Obesity in pregnancy   H/O pre-eclampsia in prior pregnancy, currently pregnant   Chronic hypertension affecting pregnancy   Status post cesarean delivery   Supervision of high risk pregnancy, antepartum   Proteinuria affecting pregnancy   BMI 40.0-44.9, adult [place]   Status post cesarean section  Additional problems: none    Discharge diagnosis: Term Pregnancy Delivered and CHTN -normotensive not requiring medications                                               Post partum procedures:none Augmentation: N/A Complications: None  Hospital course: Sceduled C/S   24y.o. yo G3P3003 at 323w0das admitted to the hospital 03/06/2020 for scheduled cesarean section with the following indication:Elective Repeat.Delivery details are as follows:  Membrane Rupture Time/Date: 1:33 PM ,03/06/2020   Delivery Method:C-Section, Low Transverse  Details of operation can be found in separate operative note.  Patient had an uncomplicated postpartum course.  She is ambulating, tolerating a regular diet, passing flatus, and urinating well. Patient is discharged home in stable condition on  03/08/20        Newborn Data: Birth date:03/06/2020  Birth time:1:35 PM  Gender:Female  Living status:Living  Apgars:9 ,9  Weight:4031 g     Magnesium Sulfate received: No BMZ received: No Rhophylac:N/A MMR:N/A T-DaP: declined Flu: No Transfusion:No  Physical exam  Vitals:   03/07/20 1157 03/07/20 1731 03/07/20 2101 03/08/20 0612  BP: 119/72 113/67 131/84 128/77  Pulse: 69 84 85 73  Resp: 18 18  15 16   Temp: 97.7 F (36.5 C) (!) 97.3 F (36.3 C) 98 F (36.7 C) 98.3 F (36.8 C)  TempSrc: Axillary Oral  Oral  SpO2: 99% 100%  98%  Weight:      Height:       General: alert, cooperative and no distress Lochia: appropriate Uterine Fundus: firm Incision: Healing well with no significant drainage DVT Evaluation: No evidence of DVT seen on physical exam. Labs: Lab Results  Component Value Date   WBC 9.9 03/07/2020   HGB 9.1 (L) 03/07/2020   HCT 29.0 (L) 03/07/2020   MCV 74.0 (L) 03/07/2020   PLT 239 03/07/2020   CMP Latest Ref Rng & Units 03/07/2020  Glucose 65 - 99 mg/dL -  BUN 6 - 20 mg/dL -  Creatinine 0.44 - 1.00 mg/dL 0.51  Sodium 134 - 144 mmol/L -  Potassium 3.5 - 5.2 mmol/L -  Chloride 96 - 106 mmol/L -  CO2 20 - 29 mmol/L -  Calcium 8.7 - 10.2 mg/dL -  Total Protein 6.0 - 8.5 g/dL -  Total Bilirubin 0.0 - 1.2 mg/dL -  Alkaline Phos 39 - 117 IU/L -  AST 0 - 40 IU/L -  ALT 0 - 32 IU/L -   Edinburgh Score: Edinburgh Postnatal Depression Scale Screening Tool 03/06/2020  I have been able to laugh and see the funny side of things. 0  I have looked forward with enjoyment to things. 0  I have  blamed myself unnecessarily when things went wrong. 2  I have been anxious or worried for no good reason. 2  I have felt scared or panicky for no good reason. 0  Things have been getting on top of me. 0  I have been so unhappy that I have had difficulty sleeping. 1  I have felt sad or miserable. 1  I have been so unhappy that I have been crying. 0  The thought of harming myself has occurred to me. 0  Edinburgh Postnatal Depression Scale Total 6     After visit meds:  Allergies as of 03/08/2020      Reactions   Iodine Rash   Shellfish Allergy Hives, Rash      Medication List    STOP taking these medications   aspirin EC 81 MG tablet     TAKE these medications   Blood Pressure Monitor Kit 1 Device by Does not apply route once a week. To be monitored Regularly  at home.   cetirizine 10 MG tablet Commonly known as: ZYRTEC Take 10 mg by mouth daily as needed for allergies.   ferrous sulfate 325 (65 FE) MG tablet Take 1 tablet (325 mg total) by mouth every other day. Start taking on: March 09, 2020   ibuprofen 800 MG tablet Commonly known as: ADVIL Take 1 tablet (800 mg total) by mouth every 6 (six) hours.   Lubricant Eye Drops 0.4-0.3 % Soln Generic drug: Polyethyl Glycol-Propyl Glycol Place 1 drop into both eyes 3 (three) times daily as needed (for dry/irritated eyes.).   Vitafol Gummies 3.33-0.333-34.8 MG Chew Chew 2 tablets by mouth daily.            Discharge Care Instructions  (From admission, onward)         Start     Ordered   03/08/20 0000  Leave dressing on - Keep it clean, dry, and intact until clinic visit        03/08/20 0619           Discharge home in stable condition Infant Feeding: Bottle and Breast Infant Disposition:home with mother Discharge instruction: per After Visit Summary and Postpartum booklet. Activity: Advance as tolerated. Pelvic rest for 6 weeks. Continue PO iron.  Diet: routine diet Future Appointments: Future Appointments  Date Time Provider [place]  03/13/2020  1:20 PM [place] None  04/04/2020  3:00 PM Sloan Leiter, MD [facility] None   Follow up Visit:   Please schedule this patient for a In person postpartum visit in 4 weeks with the following provider: Any provider. Additional Postpartum F/U:Incision check 1 week and BP check 1 week  High risk pregnancy complicated by: HTN Delivery mode:  C-Section, Low Transverse  Anticipated Birth Control: declines  03/08/2020 Janet Berlin, MD

## 2020-03-06 NOTE — Anesthesia Preprocedure Evaluation (Addendum)
Anesthesia Evaluation  Patient identified by MRN, date of birth, ID band Patient awake    Reviewed: Allergy & Precautions, H&P , NPO status , Patient's Chart, lab work & pertinent test results, reviewed documented beta blocker date and time   Airway Mallampati: I  TM Distance: >3 FB Neck ROM: full    Dental no notable dental hx. (+) Teeth Intact, Dental Advisory Given   Pulmonary neg pulmonary ROS,    Pulmonary exam normal breath sounds clear to auscultation       Cardiovascular hypertension, Pt. on medications negative cardio ROS Normal cardiovascular exam Rhythm:regular Rate:Normal     Neuro/Psych  Headaches, PSYCHIATRIC DISORDERS Anxiety Depression    GI/Hepatic negative GI ROS, Neg liver ROS,   Endo/Other  Morbid obesity  Renal/GU negative Renal ROS  negative genitourinary   Musculoskeletal   Abdominal   Peds  Hematology  (+) Blood dyscrasia, anemia ,   Anesthesia Other Findings   Reproductive/Obstetrics (+) Pregnancy                            Anesthesia Physical Anesthesia Plan  ASA: III  Anesthesia Plan: Spinal   Post-op Pain Management:    Induction:   PONV Risk Score and Plan:   Airway Management Planned:   Additional Equipment:   Intra-op Plan:   Post-operative Plan:   Informed Consent: I have reviewed the patients History and Physical, chart, labs and discussed the procedure including the risks, benefits and alternatives for the proposed anesthesia with the patient or authorized representative who has indicated his/her understanding and acceptance.       Plan Discussed with: Anesthesiologist  Anesthesia Plan Comments: (  )        Anesthesia Quick Evaluation

## 2020-03-06 NOTE — Anesthesia Postprocedure Evaluation (Signed)
Anesthesia Post Note  Patient: Monica [name]  Procedure(s) Performed: CESAREAN SECTION (N/A )     Patient location during evaluation: PACU Anesthesia Type: Spinal Level of consciousness: oriented and awake and alert Pain management: pain level controlled Vital Signs Assessment: post-procedure vital signs reviewed and stable Respiratory status: spontaneous breathing, respiratory function stable and patient connected to nasal cannula oxygen Cardiovascular status: blood pressure returned to baseline and stable Postop Assessment: no headache, no backache and no apparent nausea or vomiting Anesthetic complications: no   No complications documented.  Last Vitals:  Vitals:   03/06/20 1445 03/06/20 1500  BP: 109/61 109/64  Pulse: 73 78  Resp: (!) 22 (!) 23  Temp:    SpO2: 99% 98%    Last Pain:  Vitals:   03/06/20 1500  TempSrc:   PainSc: 0-No pain   Pain Goal:    LLE Motor Response: Purposeful movement (03/06/20 1500)   RLE Motor Response: Purposeful movement (03/06/20 1500)       Epidural/Spinal Function Cutaneous sensation: Tingles (03/06/20 1500), Patient able to flex knees: Yes (03/06/20 1500), Patient able to lift hips off bed: Yes (03/06/20 1500), Back pain beyond tenderness at insertion site: No (03/06/20 1500), Progressively worsening motor and/or sensory loss: No (03/06/20 1500), Bowel and/or bladder incontinence post epidural: No (03/06/20 1500)  Malayasia Mirkin

## 2020-03-07 LAB — CBC
HCT: 29 % — ABNORMAL LOW (ref 36.0–46.0)
Hemoglobin: 9.1 g/dL — ABNORMAL LOW (ref 12.0–15.0)
MCH: 23.2 pg — ABNORMAL LOW (ref 26.0–34.0)
MCHC: 31.4 g/dL (ref 30.0–36.0)
MCV: 74 fL — ABNORMAL LOW (ref 80.0–100.0)
Platelets: 239 10*3/uL (ref 150–400)
RBC: 3.92 MIL/uL (ref 3.87–5.11)
RDW: 15.2 % (ref 11.5–15.5)
WBC: 9.9 10*3/uL (ref 4.0–10.5)
nRBC: 0 % (ref 0.0–0.2)

## 2020-03-07 LAB — CREATININE, SERUM
Creatinine, Ser: 0.51 mg/dL (ref 0.44–1.00)
GFR, Estimated: >60 mL/min (ref >60)

## 2020-03-07 MED ORDER — FERROUS SULFATE 325 (65 FE) MG PO TABS
325.0000 mg | ORAL_TABLET | ORAL | Status: DC
  Administered 2020-03-07: 325 mg via ORAL
  Filled 2020-03-07: qty 1

## 2020-03-07 NOTE — Lactation Note (Signed)
This note was copied from a baby's chart. Lactation Consultation Note Attempted to see mom. Mom sleeping soundly.  Patient Name: Monica [name] Date: 03/07/2020     Maternal Data    Feeding Feeding Type: Bottle Fed - Formula Nipple Type: Slow - flow  LATCH Score                   Interventions    Lactation Tools Discussed/Used     Consult Status      Destynee Stringfellow G 03/07/2020, 12:34 AM

## 2020-03-07 NOTE — Social Work (Signed)
CSW received consult for hx of Anxiety and Depression.  CSW met with MOB to offer support and complete assessment.      CSW introduced self and role. CSW observed MOB wrapping baby in a swaddle and MOB's mother bedside. CSW asked MOB if she would like to speak alone for privacy, MOB declined. CSW informed MOB of reason for consult. MOB expressed understanding and stated the anxiety and depression was from 2014 with her first child. MOB denied experiencing either during her current pregnancy. MOB stated she has never taken medication or went to therapy for anxiety or depression. MOB denied experiencing any SI, HI or DV. MOB expressed she is overall feeling good.  CSW provided education regarding the baby blues period vs. perinatal mood disorders, discussed treatment and gave resources for mental health follow up if concerns arise.  CSW recommends self-evaluation during the postpartum time period using the New Mom Checklist from Postpartum Progress and encouraged MOB to contact a medical professional if symptoms are noted at any time.   CSW provided review of Sudden Infant Death Syndrome (SIDS) precautions. MOB stated baby will sleep in a basinet.   MOB stated she has all of the essential items for baby to discharge home, including a brand new carseat. Baby will receive follow-up care at [facility] with Dr. Chandler, MOB denies any transportation barriers.   MOB denies any additional needs at this time. CSW identifies no further need for intervention and no barriers to discharge at this time.  Kashius Dominic, LCSWA Clinical Social Worker [facility] [phone] 

## 2020-03-07 NOTE — Progress Notes (Signed)
Subjective: Postpartum Day 1: Cesarean Delivery Patient reports tolerating PO, + flatus and no problems voiding.    Objective: Vital signs in last 24 hours: Temp:  [97.3 F (36.3 C)-98.9 F (37.2 C)] 98.6 F (37 C) (10/13 0320) Pulse Rate:  [57-86] 57 (10/13 0320) Resp:  [18-29] 18 (10/13 0320) BP: (98-118)/(48-79) 98/61 (10/13 0320) SpO2:  [95 %-99 %] 96 % (10/13 0320) Weight:  [254 kg] 112 kg (10/12 1149)  Physical Exam:  General: alert, cooperative and no distress Lochia: appropriate Uterine Fundus: firm Incision: no significant drainage, small area old blood on left DVT Evaluation: No evidence of DVT seen on physical exam.  Recent Labs    03/04/20 0846 03/07/20 0526  HGB 10.9* 9.1*  HCT 35.3* 29.0*    Assessment/Plan: Status post Cesarean section. Doing well postoperatively.  Continue current care.  Wynelle Bourgeois 03/07/2020, 7:47 AM

## 2020-03-07 NOTE — Lactation Note (Signed)
This note was copied from a baby's chart. Lactation Consultation Note  Patient Name: Monica [name] Today's Date: 03/07/2020 Reason for consult: Initial assessment;Term;1st time breastfeeding  P3 mother whose infant is now 55 hours old.  This is a term baby at 39+0 weeks.  Mother did not breast feed her first two children.  At this time, she is uncertain as to her feeding goals.  She does wish to try breast feeding in the hospital.    Baby was in the nursery being circumcised when I arrived.  Reviewed breast feeding basics including STS, hand expression, how to awaken a sleepy baby, how to obtain a deep latch and calling for latch assistance as needed.  Mother is familiar with feeding cues.  Provided the breast feeding supplementation guidelines for mother.    She will feed 8-12 times/24 hours or with cues.  Suggested that she latch baby every time he shows cues rather than giving so much formula supplementation.  Mother verbalized understanding.  Discussed supply and demand and how breast feeding frequently will encourage a good milk supply.  Mother will call her RN/LC for assistance as needed.  Mom made aware of O/P services, breastfeeding support groups, community resources, and our phone # for post-discharge questions. Mother has a DEBP for home use.  No support person present at this time.  Rn updated.   Maternal Data Formula Feeding for Exclusion: Yes Reason for exclusion: Mother's choice to formula and breast feed on admission Has patient been taught Hand Expression?: Yes Does the patient have breastfeeding experience prior to this delivery?: No  Feeding Feeding Type: Bottle Fed - Formula  LATCH Score                   Interventions    Lactation Tools Discussed/Used     Consult Status Consult Status: Follow-up Date: 03/08/20 Follow-up type: In-patient    Dora Sims 03/07/2020, 6:22 PM

## 2020-03-08 MED ORDER — FERROUS SULFATE 325 (65 FE) MG PO TABS
325.0000 mg | ORAL_TABLET | ORAL | 3 refills | Status: DC
Start: 2020-03-09 — End: 2022-04-22

## 2020-03-08 MED ORDER — IBUPROFEN 800 MG PO TABS
800.0000 mg | ORAL_TABLET | Freq: Four times a day (QID) | ORAL | 0 refills | Status: DC
Start: 2020-03-08 — End: 2022-04-22

## 2020-03-08 NOTE — Lactation Note (Signed)
This note was copied from a baby's chart. Lactation Consultation Note  Patient Name: Monica [name] Date: 03/08/2020 Reason for consult: Follow-up assessment   P3, First time breastfeeding.  Mother declines needing assistance at this time. Encouraged breastfeeding before offering formula and pumping when only giving formula to help establish milk supply.  Mother has DEBP at home. Feed on demand with cues.  Goal 8-12+ times per day after first 24 hrs.  Place baby STS if not cueing.  Reviewed engorgement care and monitoring voids/stools.    Maternal Data    Feeding Feeding Type: Breast Fed  LATCH Score                   Interventions Interventions: Breast feeding basics reviewed;DEBP  Lactation Tools Discussed/Used     Consult Status Consult Status: Complete Date: 03/08/20    Dahlia Byes [facility] 03/08/2020, 12:26 PM

## 2020-03-09 ENCOUNTER — Telehealth: Payer: Self-pay | Admitting: *Deleted

## 2020-03-09 NOTE — Telephone Encounter (Signed)
Transition Care Management Unsuccessful Follow-up Telephone Call  Date of discharge and from where:  03/08/2020 [facility]  Attempts:  1st Attempt  Reason for unsuccessful TCM follow-up call:  Voice mail full

## 2020-03-12 NOTE — Telephone Encounter (Signed)
Message will be closed as we are outside of the 48 hour window to contact patient.  

## 2020-03-13 ENCOUNTER — Other Ambulatory Visit: Payer: Self-pay

## 2020-03-13 ENCOUNTER — Ambulatory Visit (INDEPENDENT_AMBULATORY_CARE_PROVIDER_SITE_OTHER): Payer: Medicaid Other

## 2020-03-13 VITALS — BP 118/79 | HR 87

## 2020-03-13 DIAGNOSIS — Z4889 Encounter for other specified surgical aftercare: Secondary | ICD-10-CM

## 2020-03-13 DIAGNOSIS — I10 Essential (primary) hypertension: Secondary | ICD-10-CM

## 2020-03-13 NOTE — Progress Notes (Signed)
Subjective:  Monica [name] is a 24 y.o. female here for BP check and incision check s/p c/s L TV on 03/06/20.  Hypertension ROS: no TIA's, no chest pain on exertion, no dyspnea on exertion and no swelling of ankles.    Objective:  LMP 06/07/2019   Appearance alert, well appearing, and in no distress. General exam BP noted to be well controlled today in office.    Assessment:   Blood Pressure well controlled.   Plan:  Current treatment plan is effective, no change in therapy.    Honeycomb and steri-strips removed without difficulty. The wound is C/D/I. The patient is alerted to watch for any signs of infection (redness, pus, pain, increased swelling or fever) and call if such occurs. Home wound care instructions are provided.

## 2020-03-13 NOTE — Progress Notes (Signed)
Patient was assessed and managed by nursing staff during this encounter. I have reviewed the chart and agree with the documentation and plan. I have also made any necessary editorial changes.  Catalina Antigua, MD 03/13/2020 3:33 PM

## 2020-04-04 ENCOUNTER — Other Ambulatory Visit: Payer: Self-pay

## 2020-04-04 ENCOUNTER — Encounter: Payer: Self-pay | Admitting: Obstetrics and Gynecology

## 2020-04-04 ENCOUNTER — Ambulatory Visit (INDEPENDENT_AMBULATORY_CARE_PROVIDER_SITE_OTHER): Payer: Medicaid Other | Admitting: Obstetrics and Gynecology

## 2020-04-04 VITALS — BP 126/89 | HR 80 | Wt 233.0 lb

## 2020-04-04 DIAGNOSIS — O10919 Unspecified pre-existing hypertension complicating pregnancy, unspecified trimester: Secondary | ICD-10-CM

## 2020-04-04 DIAGNOSIS — Z7189 Other specified counseling: Secondary | ICD-10-CM

## 2020-04-04 DIAGNOSIS — Z3202 Encounter for pregnancy test, result negative: Secondary | ICD-10-CM

## 2020-04-04 DIAGNOSIS — Z9189 Other specified personal risk factors, not elsewhere classified: Secondary | ICD-10-CM

## 2020-04-04 DIAGNOSIS — Z30011 Encounter for initial prescription of contraceptive pills: Secondary | ICD-10-CM

## 2020-04-04 DIAGNOSIS — O1093 Unspecified pre-existing hypertension complicating the puerperium: Secondary | ICD-10-CM

## 2020-04-04 LAB — POCT URINE PREGNANCY: Preg Test, Ur: NEGATIVE

## 2020-04-04 MED ORDER — NORETHINDRONE 0.35 MG PO TABS: 1.0000 | ORAL_TABLET | Freq: Every day | ORAL | 3 refills | Status: DC

## 2020-04-04 NOTE — Progress Notes (Signed)
Obstetrics/Postpartum Visit  Appointment Date: 04/04/2020  OBGYN Clinic: [facility]  Primary Care Provider: Patient, No Pcp Per  Chief Complaint:  Chief Complaint  Patient presents with  . Postpartum Care    History of Present Illness: Monica [name] is a 24 y.o. Asian [id number] (Patient's last menstrual period was 06/07/2019.), seen for the above chief complaint. Her past medical history is significant for chronic hypertension   She is s/p RCS on 03/06/20 at 39 weeks; she was discharged to home on POD#2. Pregnancy complicated by chronic hypertension.  Complains of feeling a stitch at her c-section site. Also desiring contraception until her boyfriend can get vasectomy. Also reports breastfeeding is going okay, she is worried about supply.  Vaginal bleeding or discharge: No  Breast or formula feeding: both Intercourse: Yes  Contraception: pills PP depression s/s: No  Any bowel or bladder issues: No  Pap smear: no abnormalities (date: 4/21)  Review of Systems: Positive for negative.   Her 12 point review of systems is negative or as noted in the History of Present Illness.  Patient Active Problem List   Diagnosis Date Noted  . Status post cesarean section 03/06/2020  . BMI 40.0-44.9, adult ([place]) 02/15/2020  . Proteinuria affecting pregnancy 10/06/2019  . Supervision of high risk pregnancy, antepartum 08/03/2019  . Status post cesarean delivery 04/02/2018  . Chronic hypertension affecting pregnancy 12/08/2017  . H/O pre-eclampsia in prior pregnancy, currently pregnant 11/10/2017  . History of C-section 09/15/2017  . Hypertension 09/15/2017  . Obesity in pregnancy 09/15/2017    Medications Aphrodite N. Dib had no medications administered during this visit. Current Outpatient Medications  Medication Sig Dispense Refill  . Blood Pressure Monitor KIT 1 Device by Does not apply route once a week. To be monitored Regularly at home. (Patient not taking: Reported on  04/04/2020) 1 kit 0  . cetirizine (ZYRTEC) 10 MG tablet Take 10 mg by mouth daily as needed for allergies.  (Patient not taking: Reported on 03/13/2020)    . ferrous sulfate 325 (65 FE) MG tablet Take 1 tablet (325 mg total) by mouth every other day. (Patient not taking: Reported on 03/13/2020) 30 tablet 3  . ibuprofen (ADVIL) 800 MG tablet Take 1 tablet (800 mg total) by mouth every 6 (six) hours. (Patient not taking: Reported on 03/13/2020) 30 tablet 0  . norethindrone (MICRONOR) 0.35 MG tablet Take 1 tablet (0.35 mg total) by mouth daily. 84 tablet 3  . Polyethyl Glycol-Propyl Glycol (LUBRICANT EYE DROPS) 0.4-0.3 % SOLN Place 1 drop into both eyes 3 (three) times daily as needed (for dry/irritated eyes.).  (Patient not taking: Reported on 03/13/2020)    . Prenatal Vit-Fe Phos-FA-Omega (VITAFOL GUMMIES) 3.33-0.333-34.8 MG CHEW Chew 2 tablets by mouth daily. (Patient not taking: Reported on 03/13/2020)     No current facility-administered medications for this visit.    Allergies Iodine and Shellfish allergy  Physical Exam:  BP 126/89   Pulse 80   Wt 233 lb (105.7 kg)   LMP 06/07/2019   BMI 44.02 kg/m  Body mass index is 44.02 kg/m. General appearance: Well nourished, well developed female in no acute distress.  Cardiovascular: regular rate and rhythm Respiratory:  Normal respiratory effort Abdomen: no masses, hernias; diffusely non tender to palpation, non distended, well healed pfannenstiel incision, right sided suture sticking out, trimmed to good effect Breasts: not examined. Neuro/Psych:  Normal mood and affect.  Skin:  Warm and dry.    PP Depression Screening:  Edinburgh Postnatal Depression Scale - 04/04/20 1509      Edinburgh Postnatal Depression Scale:  In the Past 7 Days   I have been able to laugh and see the funny side of things. 0    I have looked forward with enjoyment to things. 0    I have blamed myself unnecessarily when things went wrong. 0    I have been  anxious or worried for no good reason. 0    I have felt scared or panicky for no good reason. 0    Things have been getting on top of me. 0    I have been so unhappy that I have had difficulty sleeping. 0    I have felt sad or miserable. 0    I have been so unhappy that I have been crying. 0    The thought of harming myself has occurred to me. 0    Edinburgh Postnatal Depression Scale Total 0           Assessment: Patient is a 24 y.o. [id number] who is 4 weeks post partum from a repeat c-section. She is doing well.   Plan:  1. Encounter for oral contraception initial prescription - POCT urine pregnancy Boyfriend to get vasectomy, desires pills until then Reviewed risks/benefits of OCP vs POP, she is fine with POP  2. Postpartum state Doing well  3. Breastfeeding problem To see lactation for supply issue Take pump and baby   4. Chronic hypertension affecting pregnancy No meds, was only diagnosed in 3rd trimester of first pregnancy, no issues since  5. Counseled about COVID-19 virus infection COVID-19 Vaccine Counseling: The patient was counseled on the potential benefits and lack of known risks of COVID vaccination, during pregnancy and breastfeeding, during today's visit. The patient's questions and concerns were addressed today, including safety of the vaccination and potential side effects as they have been published by ACOG and SM[facility]. The patient has been informed that there have not been any documented vaccine related injuries, deaths or birth defects to infant or mom after receiving the COVID-19 vaccine to date. The patient has been made aware that although she is not at increased risk of contracting COVID-19 during pregnancy, she is at increased risk of developing severe disease and complications if she contracts COVID-19 while pregnant. All patient questions were addressed during our visit today. The patient is not planning to get vaccinated at this time.   Essential components  of care per ACOG recommendations:  1.  Mood and well being: Patient with negative depression screening today. Reviewed local resources for support.  - Patient does not use tobacco.  - hx of drug use? Yes  , occasional MJ prior to pregnancy and does not while breastfeeding.   2. Infant care and feeding:  -Patient currently breastmilk feeding? Yes If breastmilk feeding discussed return to work and pumping. If needed, patient was provided letter for work to allow for every 2-3 hr pumping breaks, and to be granted a private location to express breastmilk and refrigerated area to store breastmilk. Reviewed importance of draining breast regularly to support lactation. -Social determinants of health (SDOH) reviewed in EPIC. No concerns  3. Sexuality, contraception and birth spacing - Patient does not want a pregnancy in the next year.  Desired family size is 3 children.  - Reviewed forms of contraception in tiered fashion. Patient desired oral progesterone-only contraceptive today.   - Discussed birth spacing of 18 months  4. Sleep and fatigue -Encouraged family/partner/community support  of 4 hrs of uninterrupted sleep to help with mood and fatigue  5. Physical Recovery  - Discussed patients delivery and complications - Patient has urinary incontinence? No - Patient is safe to resume physical and sexual activity  6.  Health Maintenance - Last pap smear done 08/2019 and was normal with negative HPV.  7. Chronic Disease - PCP follow up   RTC prn   K. Arvilla Meres, M.D. Attending Center for [employer])

## 2020-05-23 IMAGING — US US MFM OB FOLLOW-UP
1 series · 13 of 28 positions shown · non-contrast
Comparison: none

[Series 1: us mfm ob follow-up · 68 acquisitions, 13 frames shown]
[im 3/68]
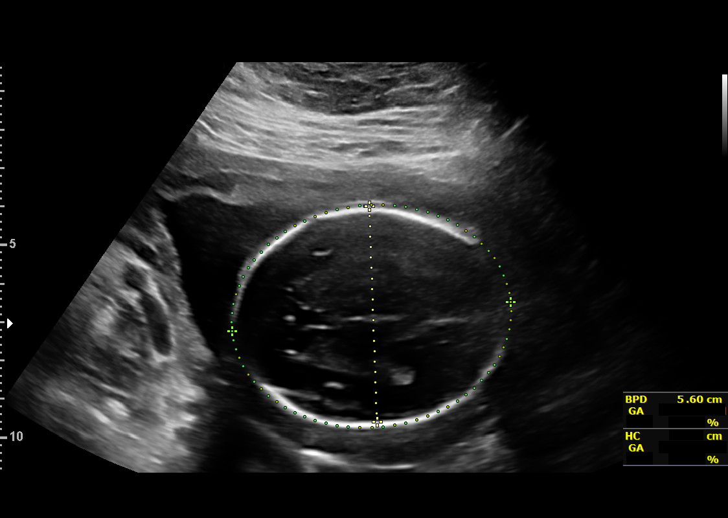
[im 8/68]
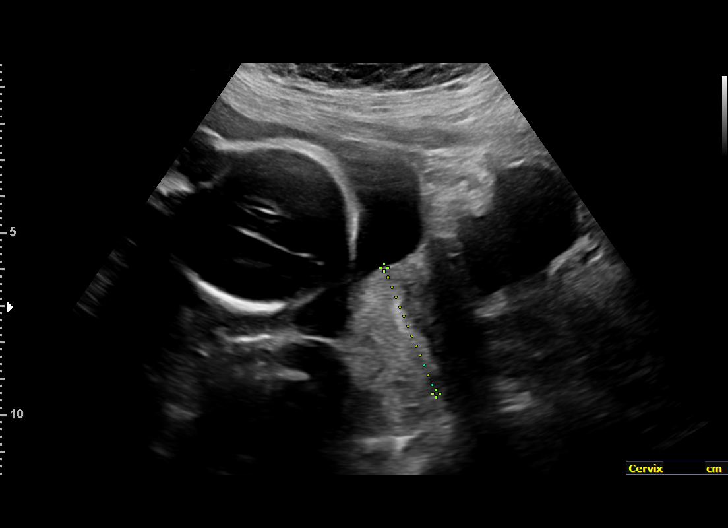
[im 13/68]
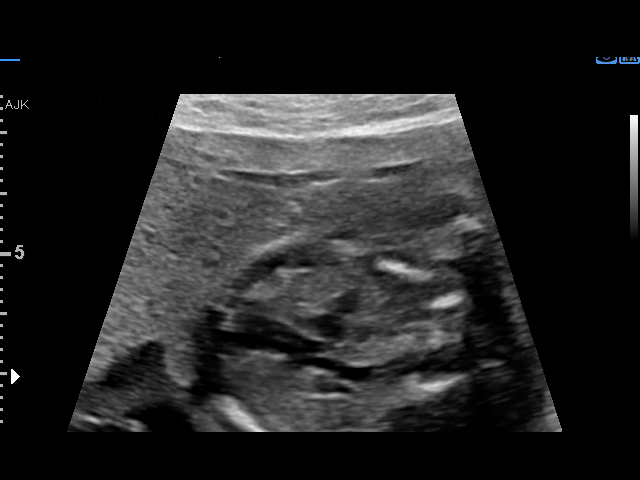
[im 18/68]
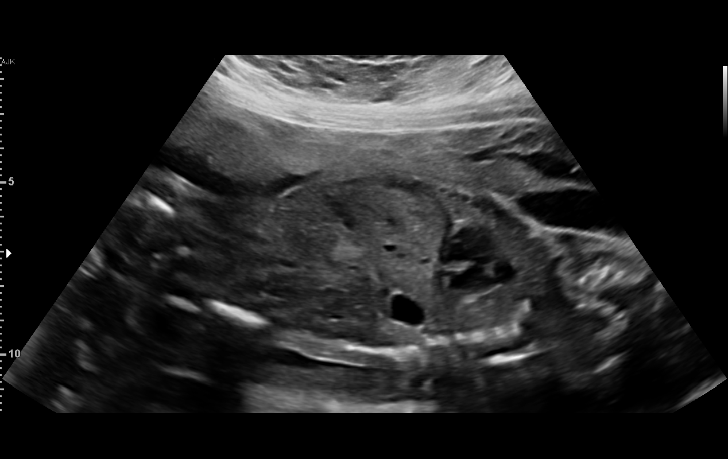
[im 23/68]
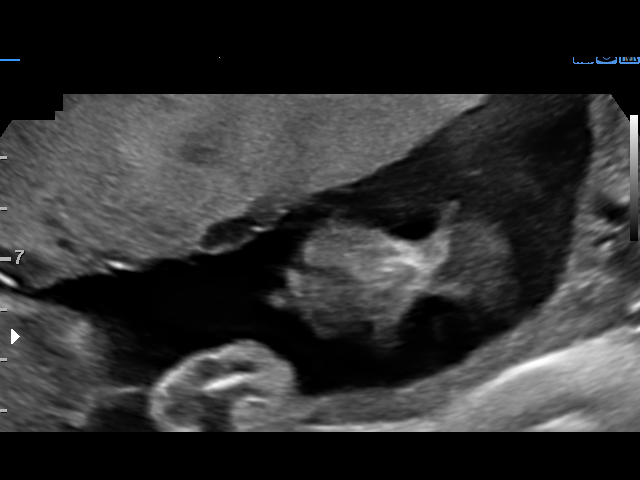
[im 28/68]
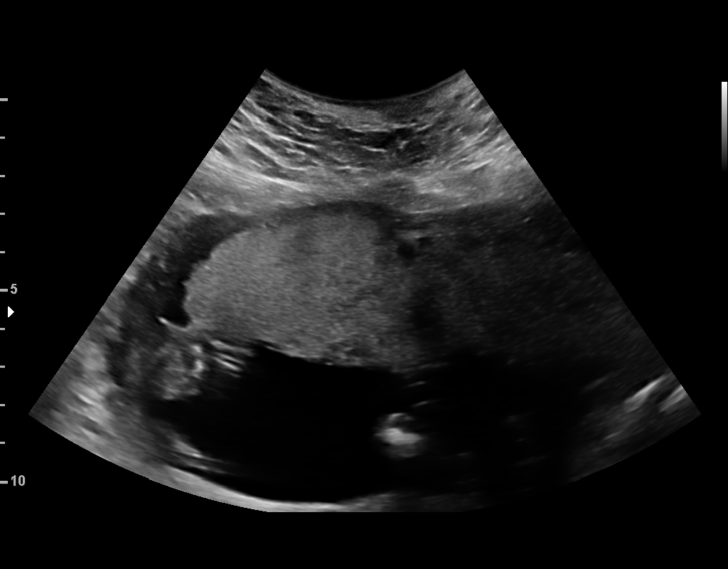
[im 35/68]
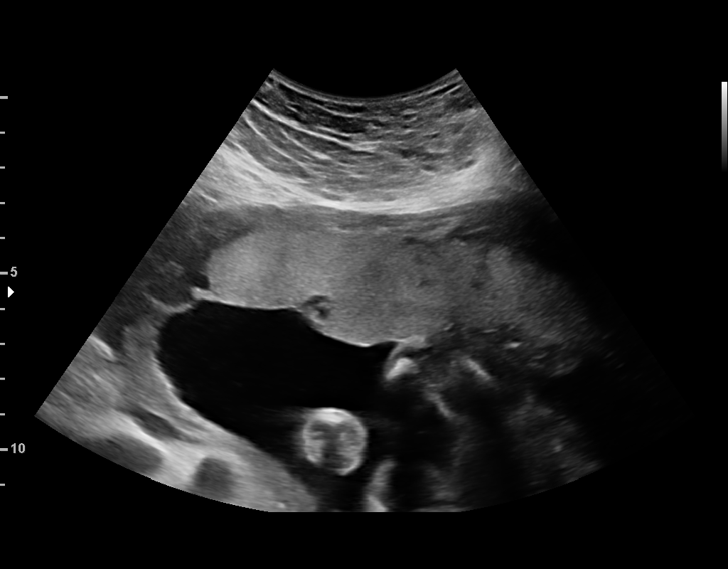
[im 40/68]
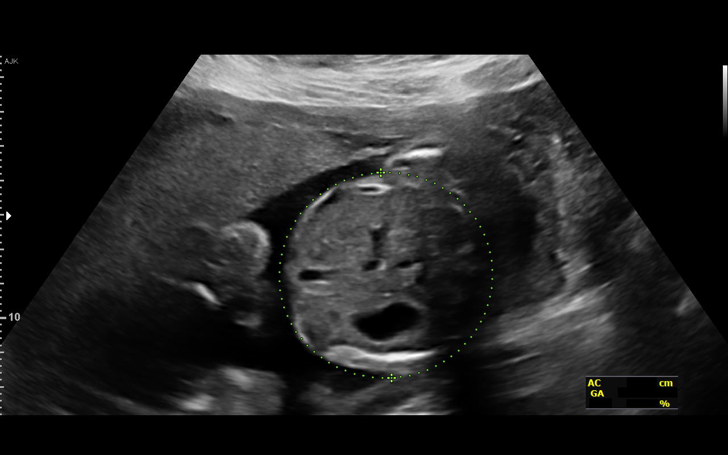
[im 45/68]
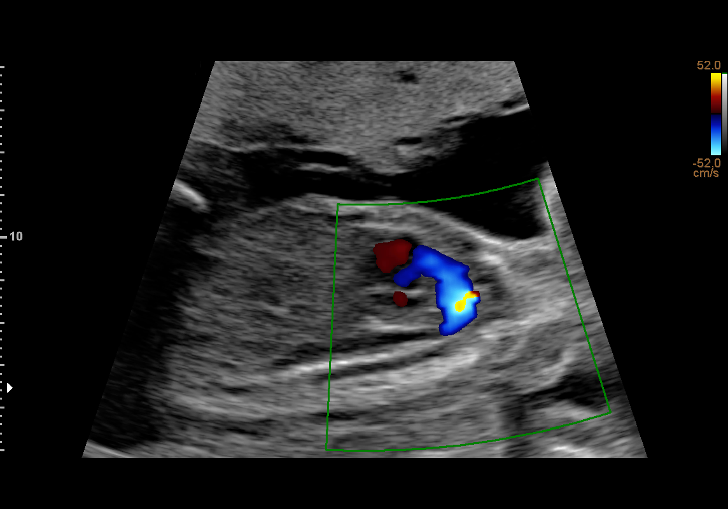
[im 50/68]
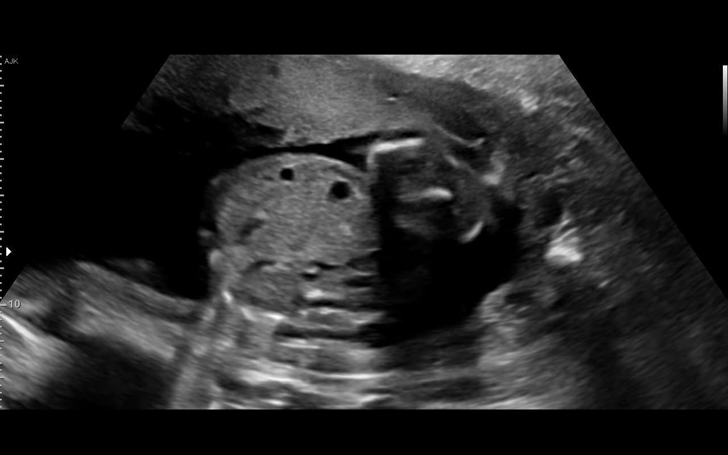
[im 55/68]
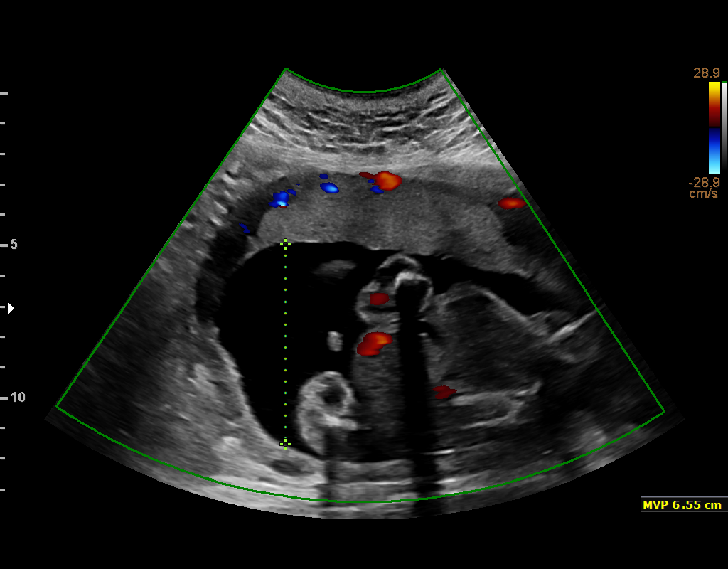
[im 60/68]
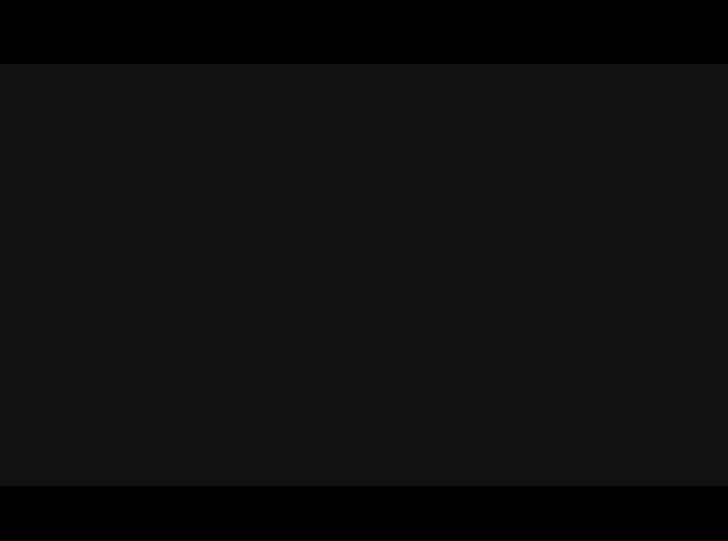
[im 65/68]
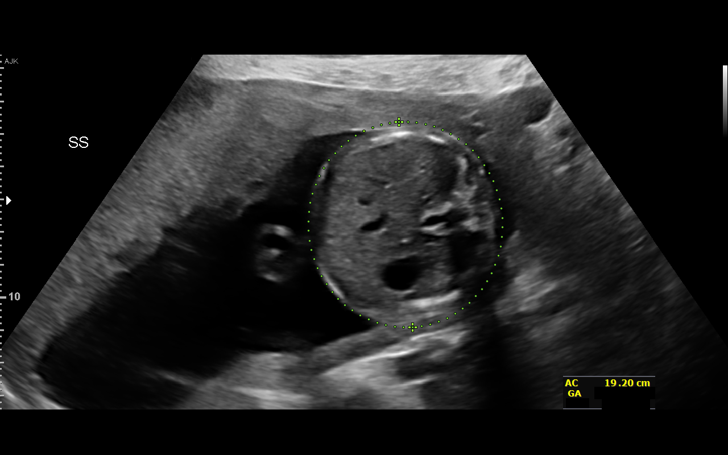

[13 of 28 positions shown; findings below may reference images not displayed]

Road [HOSPITAL]

1  ANG KILLIAN              [id number]      [id number]     [id number]
Indications

22 weeks gestation of pregnancy
Hypertension - Chronic/Pre-existing
Previous cesarean delivery, antepartum
Obesity complicating pregnancy, second
trimester
Encounter for other antenatal screening
follow-up
OB History

Blood Type:            Height:  5'1"   Weight (lb):  235       BMI:
Gravidity:    2         Term:   1
Fetal Evaluation

Num Of Fetuses:     1
Fetal Heart         142
Rate(bpm):
Cardiac Activity:   Observed
Presentation:       Cephalic
Placenta:           Anterior
P. Cord Insertion:  Visualized, central

Amniotic Fluid
AFI FV:      Subjectively within normal limits

Largest Pocket(cm)
6.55
Biometry
BPD:      55.8  mm     G. Age:  23w 0d         64  %    CI:        72.89   %    70 - 86
FL/HC:      18.8   %    19.2 -
HC:      207.8  mm     G. Age:  22w 6d         49  %    HC/AC:      1.09        1.05 -
AC:      191.2  mm     G. Age:  23w 6d         80  %    FL/BPD:     69.9   %    71 - 87
FL:         39  mm     G. Age:  22w 4d         38  %    FL/AC:      20.4   %    20 - 24
HUM:      38.2  mm     G. Age:  23w 4d         67  %

Est. FW:     574  gm      1 lb 4 oz     61  %
Gestational Age

LMP:           22w 4d        Date:  07/03/17                 EDD:   04/09/18
U/S Today:     23w 1d                                        EDD:   04/05/18
Best:          22w 4d     Det. By:  LMP  (07/03/17)          EDD:   04/09/18
Anatomy

Cranium:               Appears normal         Aortic Arch:            Not well visualized
Cavum:                 Appears normal         Ductal Arch:            Appears normal
Ventricles:            Appears normal         Diaphragm:              Appears normal
Choroid Plexus:        Appears normal         Stomach:                Appears normal, left
sided
Cerebellum:            Appears normal         Abdomen:                Appears normal
Posterior Fossa:       Appears normal         Abdominal Wall:         Appears nml (cord
insert, abd wall)
Nuchal Fold:           Appears normal         Cord Vessels:           Previously seen
Face:                  Appears normal         Kidneys:                Appear normal
(orbits and profile)
Lips:                  Appears normal         Bladder:                Appears normal
Thoracic:              Appears normal         Spine:                  Previously seen
Heart:                 Previously seen        Upper Extremities:      Previously seen
RVOT:                  Appears normal         Lower Extremities:      Previously seen
LVOT:                  Appears normal

Other:  Fetus appears to be a male. Nasal bone visualized. Technically
difficult due to maternal habitus and fetal position.
Cervix Uterus Adnexa

Cervix
Length:           3.74  cm.
Normal appearance by transabdominal scan.

Uterus
No abnormality visualized.

Left Ovary
Not visualized.

Right Ovary
Not visualized.

Adnexa:       No abnormality visualized.
Impression

Patient returned for completion of fetal anatomy. Amniotic
fluid is normal and good fetal activity is seen. Fetal growth is
appropriate for gestational age. Amniotic fluid is normal and
good fetal activity is seen.
Cardiac anatomy and face including profile appear normal.

Patient has chronic hypertension that is well-controlled
without medications. She takes low-dose aspirin prophylaxis.
Recommendations

-An appointment was made for her to return in 4 weeks for
fetal growth assessment.
-Fetal growth assessment every 4 weeks.
-Weekly antenatal testing from 36 weeks if not on
antihypertensives. Or from 34 weeks if the patient is on
antihypertensives.

## 2020-07-18 IMAGING — US US MFM OB FOLLOW-UP
1 series · 14 of 28 positions shown · non-contrast
Comparison: none

[Series 1: us mfm ob follow-up · 34 acquisitions, 14 frames shown]
[im 2/34]
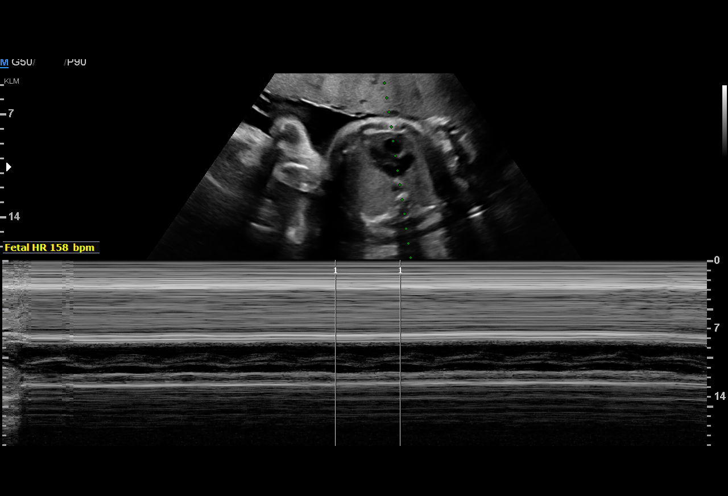
[im 4/34]
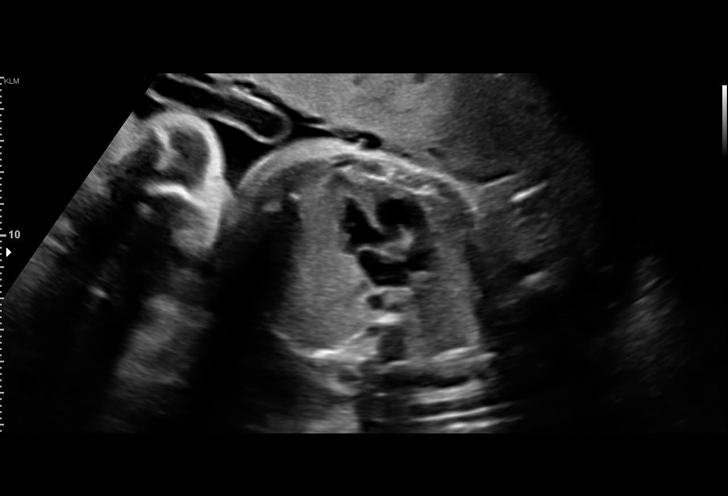
[im 7/34]
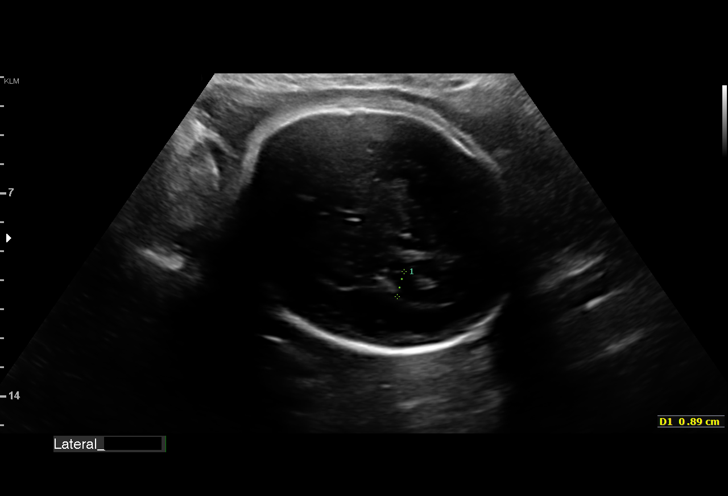
[im 9/34]
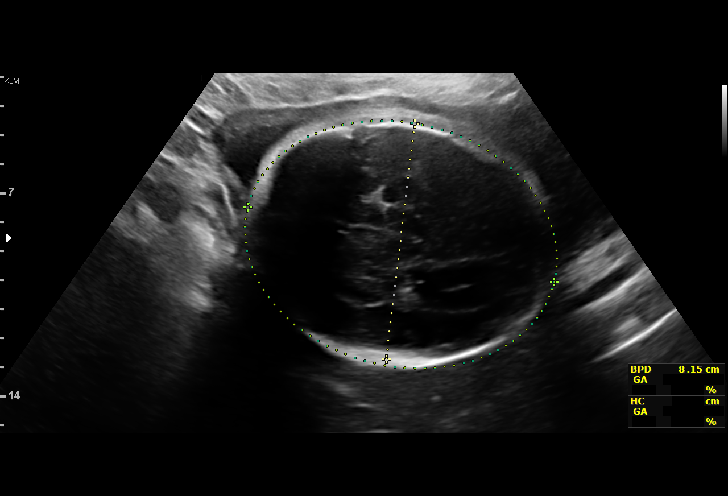
[im 12/34]
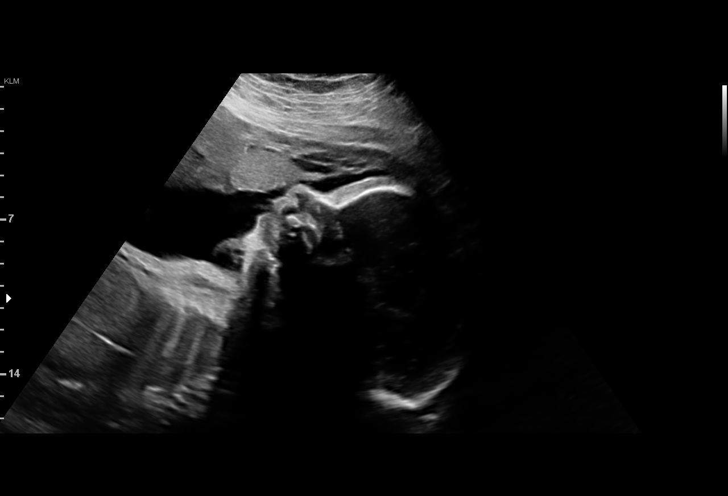
[im 14/34]
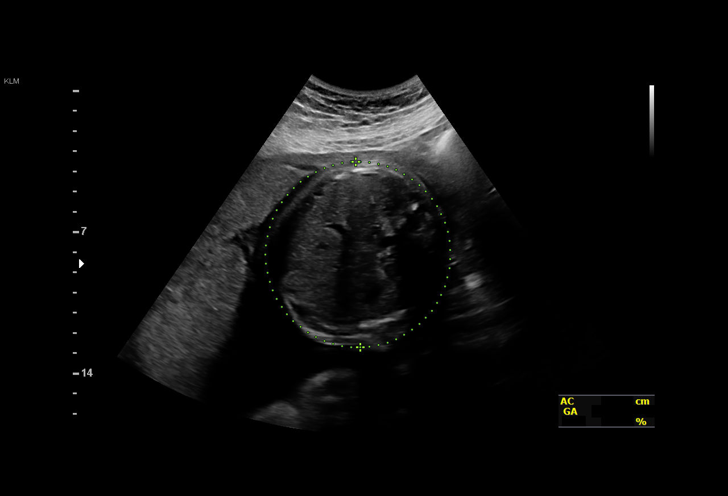
[im 16/34]
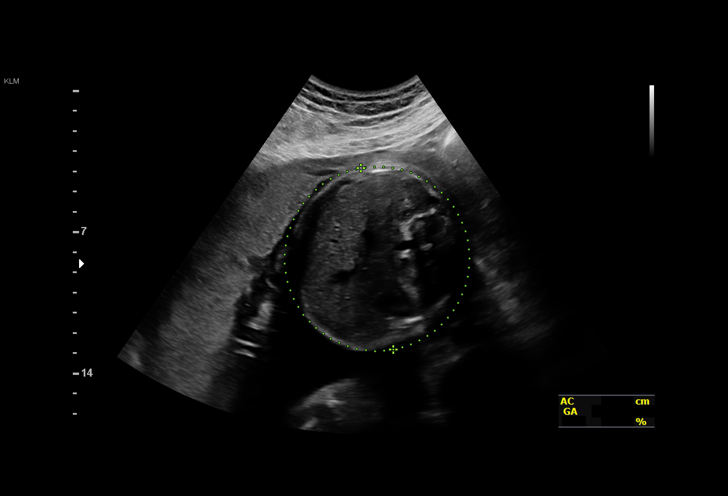
[im 19/34]
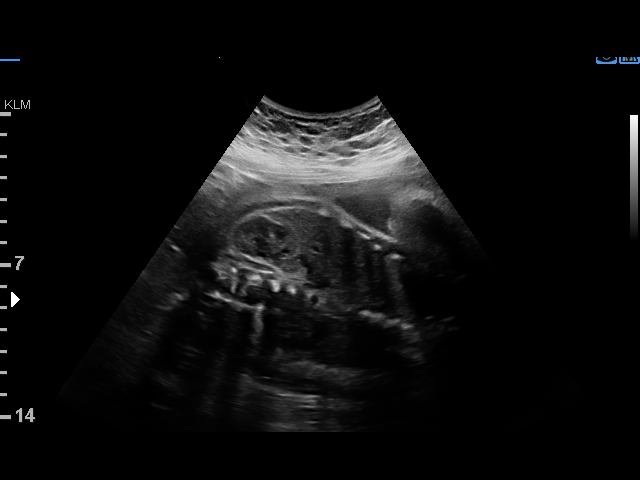
[im 21/34]
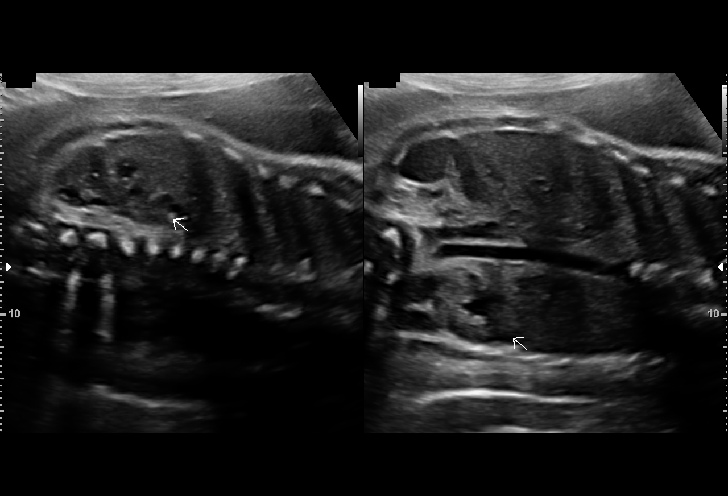
[im 24/34]
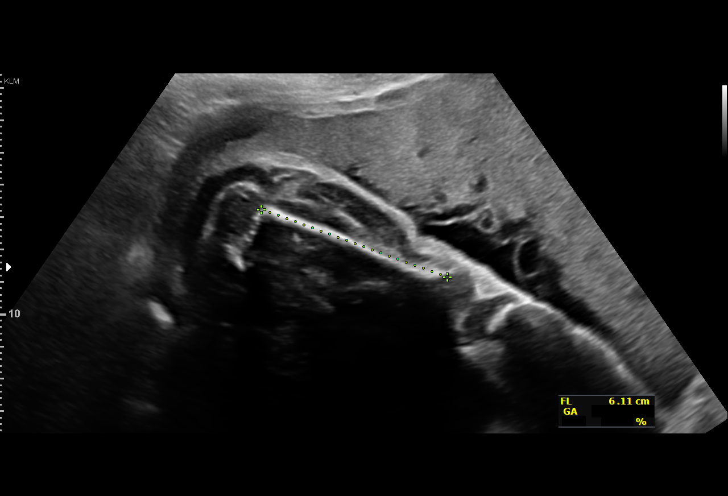
[im 26/34]
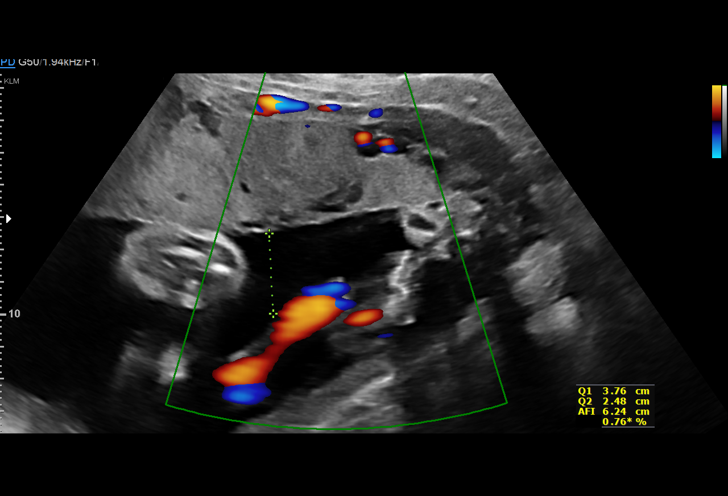
[im 29/34]
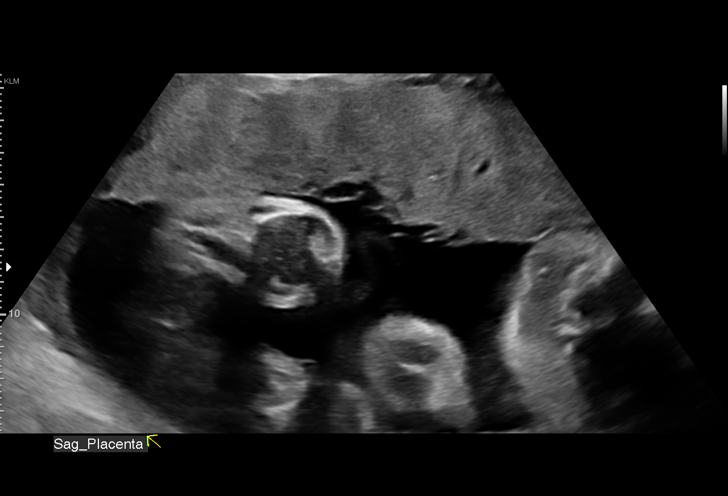
[im 31/34]
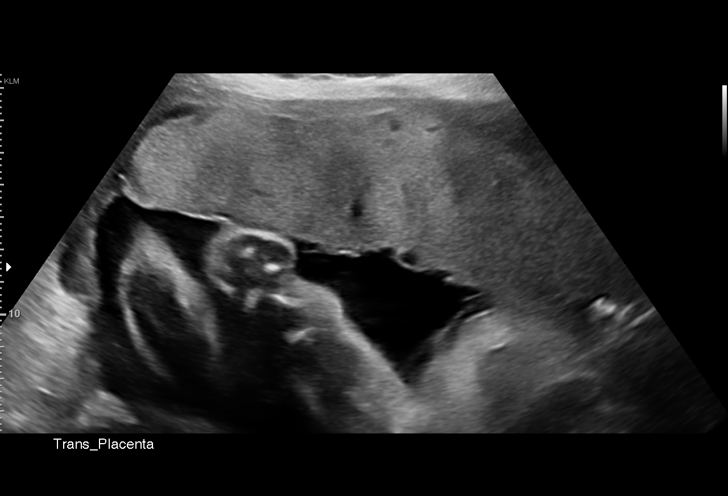
[im 34/34]
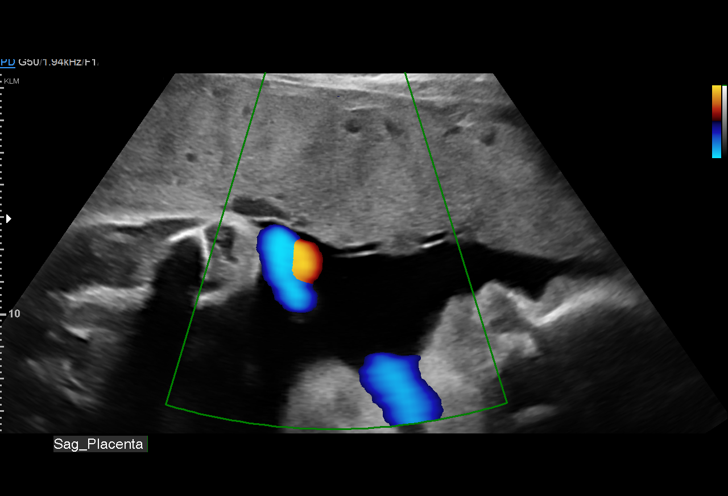

[14 of 28 positions shown; findings below may reference images not displayed]

Road [HOSPITAL]

Indications

Hypertension - Chronic/Pre-existing
30 weeks gestation of pregnancy
Previous cesarean delivery, antepartum
Obesity complicating pregnancy, third
trimester
Poor obstetric history: Previous preeclampsia
Vital Signs

Height:        5'1"
Fetal Evaluation

Num Of Fetuses:         1
Fetal Heart Rate(bpm):  158
Cardiac Activity:       Observed
Presentation:           Cephalic
Placenta:               Anterior
P. Cord Insertion:      Visualized

Amniotic Fluid
AFI FV:      Within normal limits

AFI Sum(cm)     %Tile       Largest Pocket(cm)
19.4            74

RUQ(cm)       RLQ(cm)       LUQ(cm)        LLQ(cm)
3.8
Biometry
BPD:      79.1  mm     G. Age:  31w 5d         75  %    CI:        72.87   %    70 - 86
FL/HC:      20.1   %    19.3 -
HC:      294.6  mm     G. Age:  32w 4d         70  %    HC/AC:      1.03        0.96 -
AC:      286.5  mm     G. Age:  32w 5d         93  %    FL/BPD:     74.7   %    71 - 87
FL:       59.1  mm     G. Age:  30w 6d         43  %    FL/AC:      20.6   %    20 - 24

Est. FW:    6333  gm      4 lb 3 oz     78  %
OB History

Gravidity:    2         Term:   1
Gestational Age

LMP:           30w 4d        Date:  07/03/17                 EDD:   04/09/18
U/S Today:     32w 0d                                        EDD:   03/30/18
Best:          30w 4d     Det. By:  LMP  (07/03/17)          EDD:   04/09/18
Anatomy

Cranium:               Appears normal         Aortic Arch:            Previously seen
Cavum:                 Previously seen        Ductal Arch:            Previously seen
Ventricles:            Appears normal         Diaphragm:              Appears normal
Choroid Plexus:        Previously seen        Stomach:                Appears normal, left
sided
Cerebellum:            Previously seen        Abdomen:                Appears normal
Posterior Fossa:       Previously seen        Abdominal Wall:         Previously seen
Nuchal Fold:           Not applicable (>20    Cord Vessels:           Previously seen
wks GA)
Face:                  Orbits and profile     Kidneys:                Appear normal
previously seen
Lips:                  Previously seen        Bladder:                Appears normal
Thoracic:              Appears normal         Spine:                  Previously seen
Heart:                 Appears normal         Upper Extremities:      Previously seen
(4CH, axis, and situs
RVOT:                  Appears normal         Lower Extremities:      Previously seen
LVOT:                  Appears normal

Other:  Fetus appears to be a male. Nasal bone prev visualized. Technically
difficult due to maternal habitus and fetal position.
Cervix Uterus Adnexa

Cervix
Not visualized (advanced GA >50wks)
Comments

U/S images reviewed. Findings reviewed with patient.
Appropriate fetal growth is noted.  No fetal abnormalities are
seen.
BP - 97/60.  Patient denies headaches, visual disturbance,
mid-epigastric or RUQ pain.
Questions answered.
10 minutes spent face to face.
Recommendations: 1) Serial U/S every 4 weeks for fetal
growth 2) Weekly BPP beginning @ 36 weeks
Recommendations

1) Serial U/S every 4 weeks for fetal growth 2) Weekly BPP
beginning @ 36 weeks

## 2020-08-13 DIAGNOSIS — F321 Major depressive disorder, single episode, moderate: Secondary | ICD-10-CM | POA: Diagnosis not present

## 2020-08-13 DIAGNOSIS — F419 Anxiety disorder, unspecified: Secondary | ICD-10-CM | POA: Diagnosis not present

## 2020-08-13 DIAGNOSIS — Z0000 Encounter for general adult medical examination without abnormal findings: Secondary | ICD-10-CM | POA: Diagnosis not present

## 2020-08-13 DIAGNOSIS — R21 Rash and other nonspecific skin eruption: Secondary | ICD-10-CM | POA: Diagnosis not present

## 2020-08-13 DIAGNOSIS — N760 Acute vaginitis: Secondary | ICD-10-CM | POA: Diagnosis not present

## 2020-08-13 DIAGNOSIS — E669 Obesity, unspecified: Secondary | ICD-10-CM | POA: Diagnosis not present

## 2020-08-13 DIAGNOSIS — L309 Dermatitis, unspecified: Secondary | ICD-10-CM | POA: Diagnosis not present

## 2020-09-13 DIAGNOSIS — F419 Anxiety disorder, unspecified: Secondary | ICD-10-CM | POA: Diagnosis not present

## 2020-09-13 DIAGNOSIS — F321 Major depressive disorder, single episode, moderate: Secondary | ICD-10-CM | POA: Diagnosis not present

## 2020-09-13 DIAGNOSIS — Z23 Encounter for immunization: Secondary | ICD-10-CM | POA: Diagnosis not present

## 2021-04-09 DIAGNOSIS — M25561 Pain in right knee: Secondary | ICD-10-CM | POA: Diagnosis not present

## 2021-08-21 DIAGNOSIS — L309 Dermatitis, unspecified: Secondary | ICD-10-CM | POA: Diagnosis not present

## 2022-02-10 ENCOUNTER — Other Ambulatory Visit (HOSPITAL_COMMUNITY)
Admission: RE | Admit: 2022-02-10 | Discharge: 2022-02-10 | Disposition: A | Discharge status: Home / Self Care | Payer: Medicaid Other | Source: Ambulatory Visit | Attending: Obstetrics and Gynecology | Admitting: Obstetrics and Gynecology

## 2022-02-10 ENCOUNTER — Ambulatory Visit (INDEPENDENT_AMBULATORY_CARE_PROVIDER_SITE_OTHER): Payer: Medicaid Other | Admitting: General Practice

## 2022-02-10 VITALS — BP 124/89 | HR 74 | Ht 62.0 in | Wt 220.8 lb

## 2022-02-10 DIAGNOSIS — Z202 Contact with and (suspected) exposure to infections with a predominantly sexual mode of transmission: Secondary | ICD-10-CM | POA: Insufficient documentation

## 2022-02-10 NOTE — Progress Notes (Signed)
SUBJECTIVE:  26 y.o. female presents for STD testing for possible exposure.  Denies abnormal vaginal bleeding or significant pelvic pain or fever. No UTI symptoms. Denies history of known exposure to STD.  LMP: 01-18-22  OBJECTIVE:  She appears well, afebrile. Urine dipstick: not done.  ASSESSMENT:  Vaginal Discharge  Vaginal Odor   PLAN:  GC, chlamydia, trichomonas, BVAG, CVAG probe sent to lab. Treatment: To be determined once lab results are received ROV prn if symptoms persist or worsen.

## 2022-02-11 LAB — CERVICOVAGINAL ANCILLARY ONLY
Bacterial Vaginitis (gardnerella): POSITIVE — AB
Candida Glabrata: NEGATIVE
Candida Vaginitis: NEGATIVE
Chlamydia: NEGATIVE
Comment: NEGATIVE
Comment: NEGATIVE
Comment: NEGATIVE
Comment: NEGATIVE
Comment: NEGATIVE
Comment: NORMAL
Neisseria Gonorrhea: NEGATIVE
Trichomonas: NEGATIVE

## 2022-02-11 LAB — HEPATITIS B SURFACE ANTIGEN: Hepatitis B Surface Ag: NEGATIVE

## 2022-02-11 LAB — RPR: RPR Ser Ql: NONREACTIVE

## 2022-02-11 LAB — HEPATITIS C ANTIBODY: Hep C Virus Ab: NONREACTIVE

## 2022-02-11 LAB — HIV ANTIBODY (ROUTINE TESTING W REFLEX): HIV Screen 4th Generation wRfx: NONREACTIVE

## 2022-02-11 MED ORDER — METRONIDAZOLE 500 MG PO TABS: 500.0000 mg | ORAL_TABLET | Freq: Two times a day (BID) | ORAL | 0 refills | Status: DC

## 2022-02-11 NOTE — Addendum Note (Signed)
Addended by: Mora Bellman on: 02/11/2022 01:46 PM   Modules accepted: Orders

## 2022-03-10 DIAGNOSIS — E559 Vitamin D deficiency, unspecified: Secondary | ICD-10-CM

## 2022-03-10 HISTORY — DX: Vitamin D deficiency, unspecified: E55.9

## 2022-03-13 ENCOUNTER — Emergency Department (HOSPITAL_COMMUNITY)
Admission: EM | Admit: 2022-03-13 | Discharge: 2022-03-14 | Disposition: A | Discharge status: Home / Self Care | Payer: Medicaid Other | Attending: Emergency Medicine | Admitting: Emergency Medicine

## 2022-03-13 ENCOUNTER — Emergency Department (HOSPITAL_COMMUNITY): Payer: Medicaid Other

## 2022-03-13 ENCOUNTER — Other Ambulatory Visit: Payer: Self-pay

## 2022-03-13 ENCOUNTER — Encounter (HOSPITAL_COMMUNITY): Payer: Self-pay

## 2022-03-13 DIAGNOSIS — N9489 Other specified conditions associated with female genital organs and menstrual cycle: Secondary | ICD-10-CM | POA: Insufficient documentation

## 2022-03-13 DIAGNOSIS — O209 Hemorrhage in early pregnancy, unspecified: Secondary | ICD-10-CM | POA: Diagnosis present

## 2022-03-13 DIAGNOSIS — Z3A01 Less than 8 weeks gestation of pregnancy: Secondary | ICD-10-CM | POA: Diagnosis not present

## 2022-03-13 DIAGNOSIS — O4690 Antepartum hemorrhage, unspecified, unspecified trimester: Secondary | ICD-10-CM

## 2022-03-13 LAB — CBC WITH DIFFERENTIAL/PLATELET
Abs Immature Granulocytes: 0.07 10*3/uL (ref 0.00–0.07)
Basophils Absolute: 0 10*3/uL (ref 0.0–0.1)
Basophils Relative: 0 %
Eosinophils Absolute: 0.2 10*3/uL (ref 0.0–0.5)
Eosinophils Relative: 2 %
HCT: 41.9 % (ref 36.0–46.0)
Hemoglobin: 13.2 g/dL (ref 12.0–15.0)
Immature Granulocytes: 1 %
Lymphocytes Relative: 26 %
Lymphs Abs: 2.8 10*3/uL (ref 0.7–4.0)
MCH: 25.4 pg — ABNORMAL LOW (ref 26.0–34.0)
MCHC: 31.5 g/dL (ref 30.0–36.0)
MCV: 80.7 fL (ref 80.0–100.0)
Monocytes Absolute: 1 10*3/uL (ref 0.1–1.0)
Monocytes Relative: 9 %
Neutro Abs: 6.7 10*3/uL (ref 1.7–7.7)
Neutrophils Relative %: 62 %
Platelets: 343 10*3/uL (ref 150–400)
RBC: 5.19 MIL/uL — ABNORMAL HIGH (ref 3.87–5.11)
RDW: 14.3 % (ref 11.5–15.5)
WBC: 10.8 10*3/uL — ABNORMAL HIGH (ref 4.0–10.5)
nRBC: 0 % (ref 0.0–0.2)

## 2022-03-13 LAB — HCG, QUANTITATIVE, PREGNANCY: hCG, Beta Chain, Quant, S: 4969 m[IU]/mL — ABNORMAL HIGH (ref <5)

## 2022-03-13 LAB — I-STAT BETA HCG BLOOD, ED (MC, WL, AP ONLY): I-stat hCG, quantitative: >2000 m[IU]/mL — ABNORMAL HIGH (ref <5)

## 2022-03-13 LAB — COMPREHENSIVE METABOLIC PANEL
ALT: 23 U/L (ref 0–44)
AST: 23 U/L (ref 15–41)
Albumin: 3.9 g/dL (ref 3.5–5.0)
Alkaline Phosphatase: 41 U/L (ref 38–126)
Anion gap: 6 (ref 5–15)
BUN: 10 mg/dL (ref 6–20)
CO2: 23 mmol/L (ref 22–32)
Calcium: 9.1 mg/dL (ref 8.9–10.3)
Chloride: 110 mmol/L (ref 98–111)
Creatinine, Ser: 0.67 mg/dL (ref 0.44–1.00)
GFR, Estimated: >60 mL/min (ref >60)
Glucose, Bld: 98 mg/dL (ref 70–99)
Potassium: 4.6 mmol/L (ref 3.5–5.1)
Sodium: 139 mmol/L (ref 135–145)
Total Bilirubin: 0.8 mg/dL (ref 0.3–1.2)
Total Protein: 7.8 g/dL (ref 6.5–8.1)

## 2022-03-13 NOTE — ED Triage Notes (Signed)
Pt reports going to obgyn due to light bleeding x4 days and vaginal ultrasound was performed. Sent to r/o ectopic pregnancy.  Denies pain

## 2022-03-13 NOTE — ED Provider Triage Note (Signed)
Emergency Medicine Provider Triage Evaluation Note  Monica [name] Begin , a 26 y.o. female  was evaluated in triage.  Pt complains of concern for ectopic pregnancy.  Had a pregnancy test at home that was positive Saturday.  Was seen by OB/GYN today.  Pregnancy test was again positive.  Ultrasound did not show intrauterine pregnancy.  Concern for ectopic.  Patient does endorse lower right quadrant pain.  Denies shortness of breath, lightheadedness, fatigue and chest pain.  Review of Systems  Positive: See above Negative: See above  Physical Exam  BP (!) 133/96 (BP Location: Right Arm)   Pulse 88   Temp 98 F (36.7 C) (Oral)   Resp 18   Ht 5\' 2"  (1.575 m)   Wt 99.8 kg   LMP 12/23/2021   SpO2 100%   BMI 40.24 kg/m  Gen:   Awake, no distress   Resp:  Normal effort  MSK:   Moves extremities without difficulty  Other:    Medical Decision Making  Medically screening exam initiated at 5:02 PM.  Appropriate orders placed.  Monica [name] was informed that the remainder of the evaluation will be completed by another provider, this initial triage assessment does not replace that evaluation, and the importance of remaining in the ED until their evaluation is complete.  Work up initiated   Harriet Pho, [place] 03/28/22 [phone]

## 2022-03-13 NOTE — ED Notes (Signed)
Lab said the lavender was clotted and needs to be recollected.

## 2022-03-14 LAB — URINALYSIS, ROUTINE W REFLEX MICROSCOPIC
Bilirubin Urine: NEGATIVE
Glucose, UA: NEGATIVE mg/dL
Hgb urine dipstick: NEGATIVE
Ketones, ur: NEGATIVE mg/dL
Leukocytes,Ua: NEGATIVE
Nitrite: NEGATIVE
Protein, ur: NEGATIVE mg/dL
Specific Gravity, Urine: 1.005 (ref 1.005–1.030)
pH: 6 (ref 5.0–8.0)

## 2022-03-14 NOTE — Discharge Instructions (Signed)
You need to have a repeat pregnancy hormone in about 48 hours and may need a repeat ultrasound.  Please contact the number listed.

## 2022-03-14 NOTE — ED Provider Notes (Signed)
[facility] Emergency Department Provider Note MRN:  [id number]  Arrival date & time: 03/14/22     Chief Complaint   Vaginal Bleeding   History of Present Illness   Monica [name] is a 26 y.o. year-old female presents to the ED with chief complaint of vaginal bleeding.  She states that she thinks she is pregnant.  Was seen in an abortion clinic, but they were not able to identify an intrauterine pregnancy.  She was sent here for further evaluation and work-up.  Patient denies having any pain.  She states last menstrual cycle was about 12 weeks ago.  History provided by patient.   Review of Systems  Pertinent positive and negative review of systems noted in HPI.    Physical Exam   Vitals:   03/14/22 0258 03/14/22 0415  BP: 125/85 (!) 130/92  Pulse: 83 81  Resp: 18 20  Temp: 98.6 F (37 C)   SpO2: (!) 85% 98%    CONSTITUTIONAL: Nontoxic-appearing, NAD NEURO:  Alert and oriented x 3, CN 3-12 grossly intact EYES:  eyes equal and reactive ENT/NECK:  Supple, no stridor  CARDIO: Normal rate, regular rhythm, appears well-perfused  PULM:  No respiratory distress,  GI/GU:  non-distended,  MSK/SPINE:  No gross deformities, no edema, moves all extremities  SKIN:  no rash, atraumatic   *Additional and/or pertinent findings included in MDM below  Diagnostic and Interventional Summary    EKG Interpretation  Date/Time:    Ventricular Rate:    PR Interval:    QRS Duration:   QT Interval:    QTC Calculation:   R Axis:     Text Interpretation:         Labs Reviewed  URINALYSIS, ROUTINE W REFLEX MICROSCOPIC - Abnormal; Notable for the following components:      Result Value   Color, Urine STRAW (*)    All other components within normal limits  HCG, QUANTITATIVE, PREGNANCY - Abnormal; Notable for the following components:   hCG, Beta Chain, Quant, S 4,969 (*)    All other components within normal limits  CBC WITH DIFFERENTIAL/PLATELET  - Abnormal; Notable for the following components:   WBC 10.8 (*)    RBC 5.19 (*)    MCH 25.4 (*)    All other components within normal limits  I-STAT BETA HCG BLOOD, ED ([facility], [facility], [facility] ONLY) - Abnormal; Notable for the following components:   I-stat hCG, quantitative >2,000.0 (*)    All other components within normal limits  COMPREHENSIVE METABOLIC PANEL  CBC WITH DIFFERENTIAL/PLATELET    US OB LESS THAN 14 WEEKS WITH OB TRANSVAGINAL  Final Result      Medications - No data to display   Procedures  /  Critical Care Procedures  ED Course and Medical Decision Making  I have reviewed the triage vital signs, the nursing notes, and pertinent available records from the EMR.  Social Determinants Affecting Complexity of Care: Patient has no clinically significant social determinants affecting this chief complaint..   ED Course:    Medical Decision Making Patient is a very well-appearing 26 year old female who presents for vaginal bleeding in the setting of early pregnancy.  hCG is 4969.  Ultrasound was performed and shows probable early intrauterine pregnancy, but repeat hCG and follow-up ultrasound is recommended.  Recommend that the patient follow-up with the women's and children's or her OB/GYN.  She is understanding and agreeable with plan.     Consultants: No consultations were needed in  caring for this patient.   Treatment and Plan: Emergency department workup does not suggest an emergent condition requiring admission or immediate intervention beyond  what has been performed at this time. The patient is safe for discharge and has  been instructed to return immediately for worsening symptoms, change in  symptoms or any other concerns    Final Clinical Impressions(s) / ED Diagnoses     ICD-10-CM   1. Vaginal bleeding in pregnancy  O46.90       ED Discharge Orders     None         Discharge Instructions Discussed with and Provided to Patient:    Discharge  Instructions      You need to have a repeat pregnancy hormone in about 48 hours and may need a repeat ultrasound.  Please contact the number listed.      Roxy Horseman, PA-C 03/14/22 0429    Melene Plan, DO 03/14/22 [phone]

## 2022-04-04 IMAGING — US US MFM OB DETAIL+14 WK
1 series · 13 of 28 positions shown · non-contrast
Comparison: none

[Series 1: us mfm ob detail+14 wk · 13 of 38 slices shown]
[im 2/38]
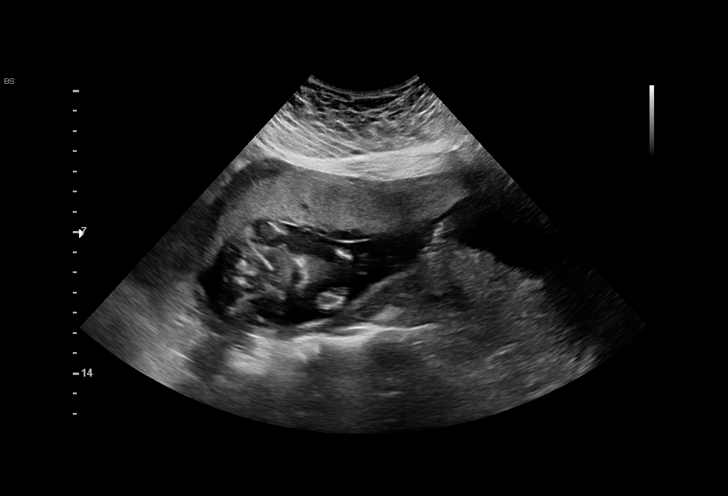
[im 5/38]
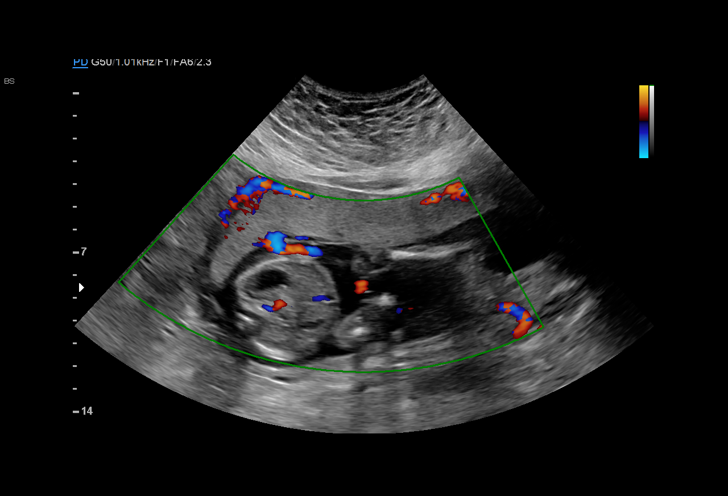
[im 7/38]
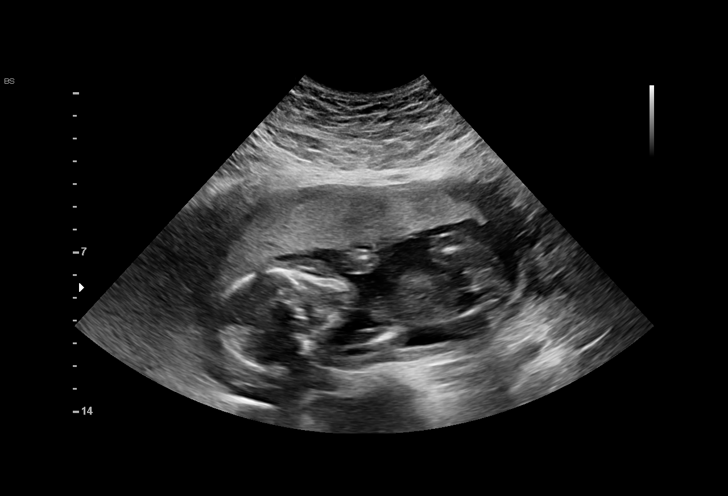
[im 10/38]
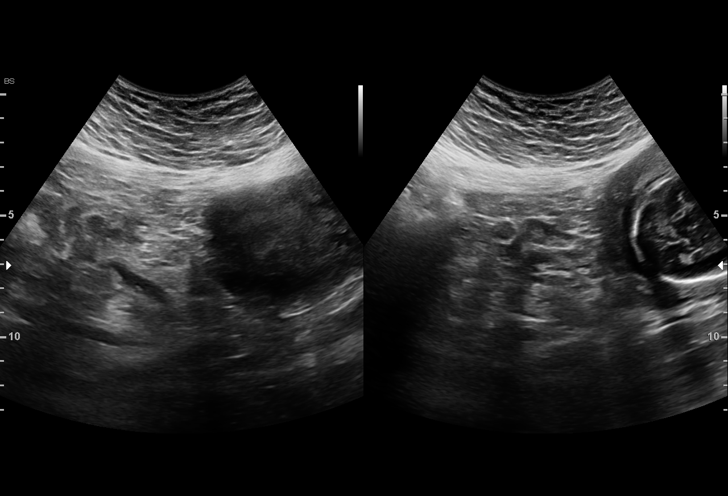
[im 13/38]
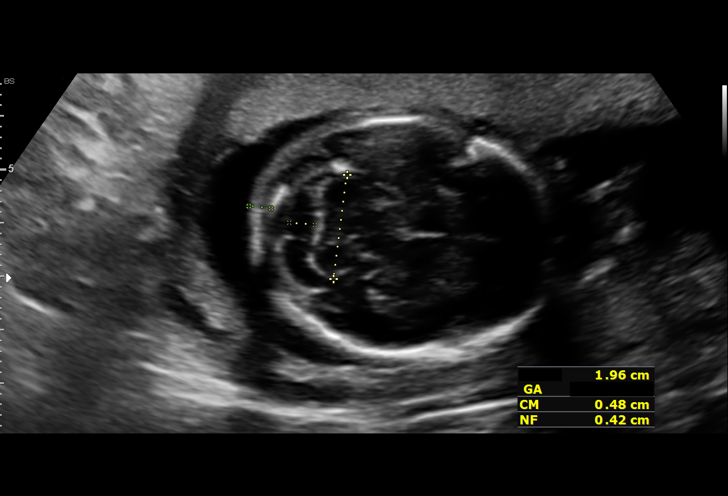
[im 16/38]
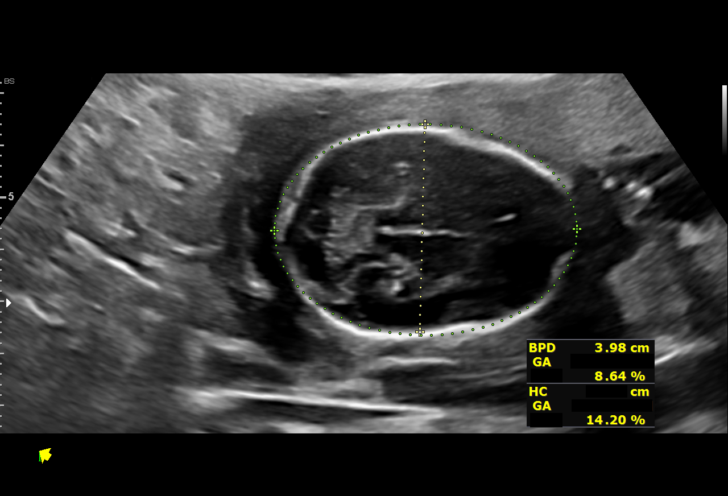
[im 20/38]
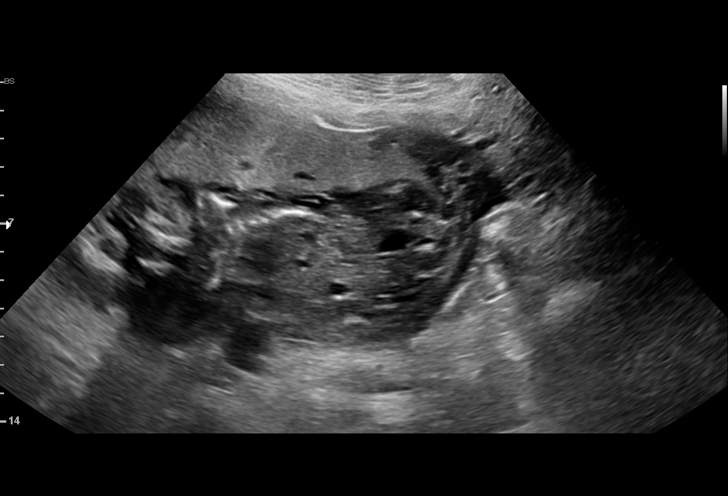
[im 22/38]
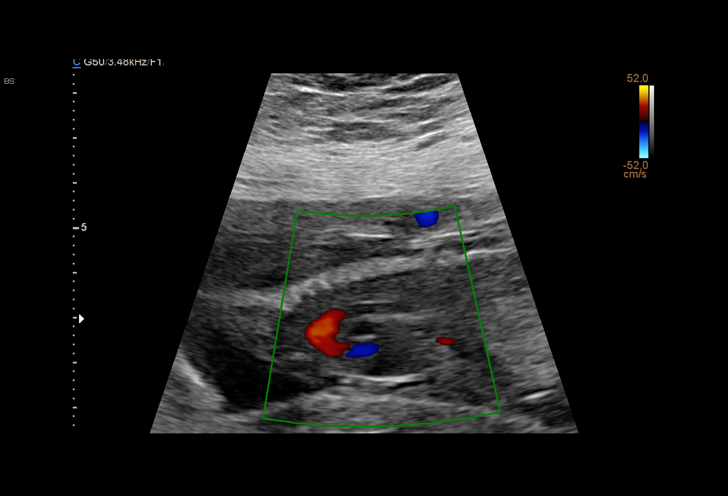
[im 25/38]
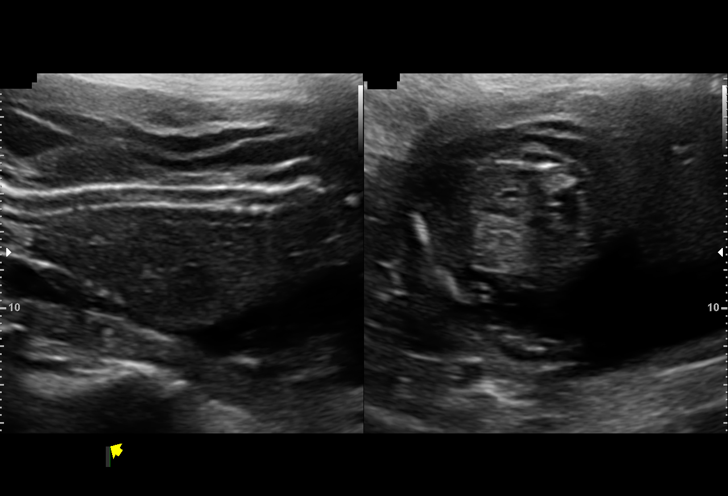
[im 28/38]
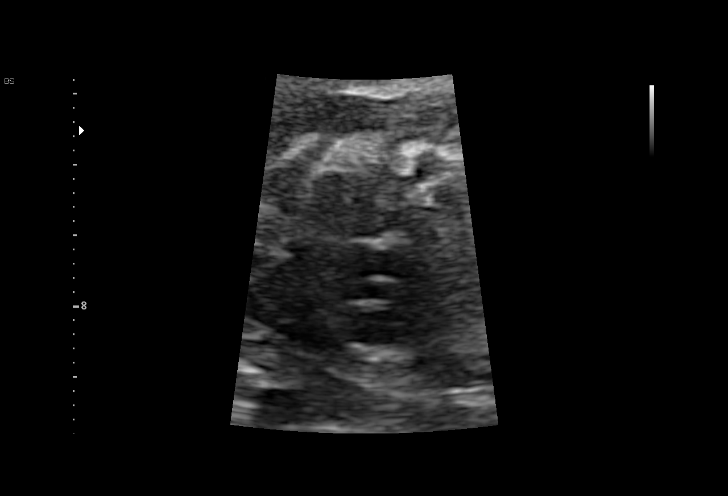
[im 31/38]
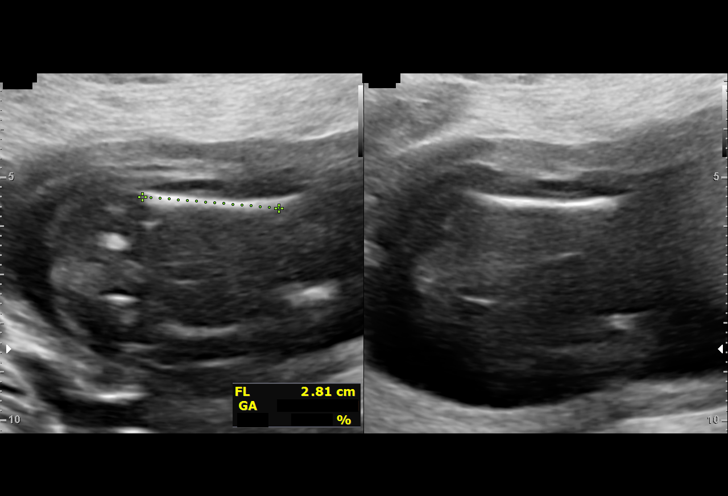
[im 33/38]
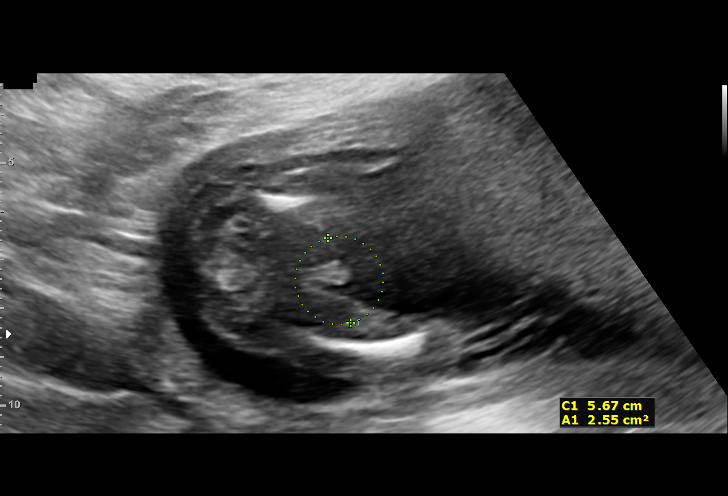
[im 36/38]
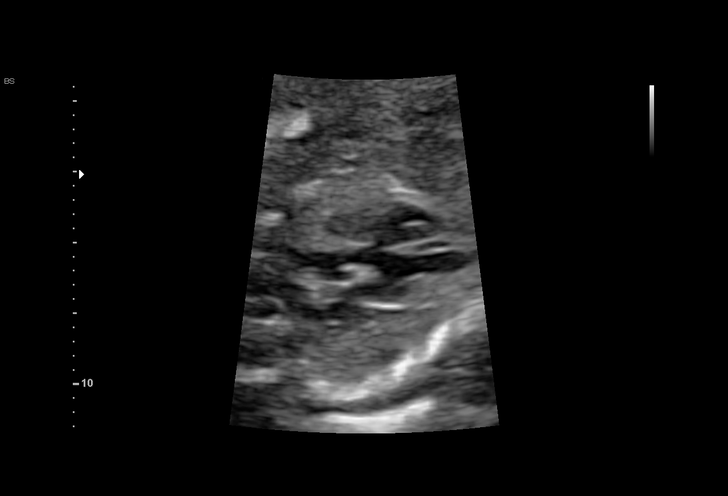

[13 of 28 positions shown; findings below may reference images not displayed]

Ref. Address:     Faculty

Indications

 Hypertension - Chronic/Pre-existing
 Obesity complicating pregnancy, second
 trimester
 19 weeks gestation of pregnancy
 Encounter for antenatal screening for
 malformations
 Previous cesarean delivery, antepartum
 Poor obstetric history: Previous
 preeclampsia / eclampsia/gestational HTN
Fetal Evaluation

 Num Of Fetuses:         1
 Fetal Heart Rate(bpm):  148
 Cardiac Activity:       Observed
 Presentation:           Transverse, head to maternal right
 Placenta:               Anterior
 P. Cord Insertion:      Marginal insertion

 Amniotic Fluid
 AFI FV:      Subjectively low-normal

                             Largest Pocket(cm)

Biometry

 BPD:      39.8  mm     G. Age:  18w 1d          9  %    CI:        66.04   %    70 - 86
                                                         FL/HC:      18.7   %    16.1 -
 HC:      157.1  mm     G. Age:  18w 4d         14  %    HC/AC:      1.15        1.09 -
 AC:      136.3  mm     G. Age:  19w 1d         38  %    FL/BPD:     73.6   %
 FL:       29.3  mm     G. Age:  19w 0d         33  %    FL/AC:      21.5   %    20 - 24
 HUM:        23  mm     G. Age:  17w 1d        < 5  %
 CER:      19.6  mm     G. Age:  18w 6d         39  %
 NFT:       4.2  mm
 LV:        4.8  mm
 CM:        4.8  mm

 Est. FW:     267  gm      0 lb 9 oz     28  %
OB History

 Blood Type:   O+
 Gravidity:    3         Term:   2        Prem:   0        SAB:   0
 TOP:          0       Ectopic:  0        Living: 2
Gestational Age

 LMP:           19w 2d        Date:  06/07/19                 EDD:   03/13/20
 U/S Today:     18w 5d                                        EDD:   03/17/20
 Best:          19w 2d     Det. By:  LMP  (06/07/19)          EDD:   03/13/20
Anatomy

 Cranium:               Appears normal         Aortic Arch:            Not well visualized
 Cavum:                 Not well visualized    Ductal Arch:            Not well visualized
 Ventricles:            Appears normal         Diaphragm:              Not well visualized
 Choroid Plexus:        Not well visualized    Stomach:                Not well visualized
 Cerebellum:            Appears normal         Abdomen:                Appears normal
 Posterior Fossa:       Appears normal         Abdominal Wall:         Appears nml (cord
                                                                       insert, abd wall)
 Nuchal Fold:           Appears normal         Cord Vessels:           Not well visualized
 Face:                  Orbits nl; profile not Kidneys:                Not well visualized
                        well visualized
 Lips:                  Not well visualized    Bladder:                Appears normal
 Palate:                Not well visualized    Spine:                  Limited views
                                                                       appear normal
 Heart:                 Not well visualized    Upper Extremities:      Not well visualized
 RVOT:                  Not well visualized    Lower Extremities:      Appears normal
 LVOT:                  Not well visualized

 Other:  Fetus appears to be a male.  Technically difficult due to maternal
         habitus and fetal position.
Cervix Uterus Adnexa

 Cervix
 Length:           3.42  cm.
 Normal appearance by transabdominal scan.

 Uterus
 No abnormality visualized.

 Right Ovary
 Not visualized.

 Left Ovary
 Not visualized.

 Cul De Sac
 No free fluid seen.
 Adnexa
 No abnormality visualized.
Comments

 This patient was seen for a detailed fetal anatomy scan due
 to a history of chronic hypertension that is not currently
 treated with any medications and due to maternal obesity.
 She had a cell free DNA test earlier in her pregnancy which
 indicated a low risk for trisomy 21, 18, and 13. A male fetus is
 predicted.
 The fetal growth and amniotic fluid level were appropriate for
 her gestational age.
 The views of the fetal anatomy were limited today due to the
 fetal position and maternal body habitus.
 Anomalies may be missed due to technical limitations. If the
 fetus is in a suboptimal position or maternal habitus is
 increased, visualization of the fetus in the maternal uterus
 may be impaired.
 A follow-up exam was scheduled in 4 weeks to complete the
 views of the fetal anatomy.

## 2022-04-22 ENCOUNTER — Encounter: Payer: Self-pay | Admitting: Family Medicine

## 2022-04-22 ENCOUNTER — Telehealth (INDEPENDENT_AMBULATORY_CARE_PROVIDER_SITE_OTHER): Payer: Medicaid Other | Admitting: Family Medicine

## 2022-04-22 DIAGNOSIS — Z30015 Encounter for initial prescription of vaginal ring hormonal contraceptive: Secondary | ICD-10-CM | POA: Diagnosis not present

## 2022-04-22 DIAGNOSIS — Z1330 Encounter for screening examination for mental health and behavioral disorders, unspecified: Secondary | ICD-10-CM

## 2022-04-22 MED ORDER — ETONOGESTREL-ETHINYL ESTRADIOL 0.12-0.015 MG/24HR VA RING: VAGINAL_RING | VAGINAL | 4 refills | Status: DC

## 2022-04-22 NOTE — Progress Notes (Signed)
Patient presents for mychart visit for birth control consult. Patient identified with two patient identifiers. Patient desires to start on nexplanon

## 2022-04-22 NOTE — Progress Notes (Signed)
   GYNECOLOGY OFFICE VISIT NOTE  History:   Monica [name] is a 26 y.o. [phone] here today to discuss birth control.  Was pregnant about 1 month ago, and seen at [facility] ER, however she reports that she took mifepristone about 2 weeks ago which she got [facility]. She bled for about 6 days. Bleeding is over now and she will like to get on birth contol. Has not done a pregnancy test since then. Wanting a nuvaring. Has tried nexplanon. OCPs in the past. Had some mood changes with the nexplanon. She does not smoke cigarettes, no prior personal or family history of DVT or breast cancer.   Last Pap smear was 01/2020 and was normal.  Past Medical History:  Diagnosis Date   Anxiety    Depression    Headache    Hypertension    Pregnancy induced hypertension    Vaginal Pap smear, abnormal     Past Surgical History:  Procedure Laterality Date   arthoscopic knee Right 2013   CESAREAN SECTION N/A 11/05/2012   Procedure: CESAREAN SECTION;  Surgeon: Antionette Char, MD;  Location: [facility] ORS;  Service: Obstetrics;  Laterality: N/A;   CESAREAN SECTION N/A 04/02/2018   Procedure: CESAREAN SECTION;  Surgeon: Lesly Dukes, MD;  Location: [facility] BIRTHING SUITES;  Service: Obstetrics;  Laterality: N/A;   CESAREAN SECTION N/A 03/06/2020   Procedure: CESAREAN SECTION;  Surgeon: Levie Heritage, DO;  Location: [facility] LD ORS;  Service: Obstetrics;  Laterality: N/A;   COLPOSCOPY  05/07/2016   knee srthoscopy N/A     The following portions of the patient's history were reviewed and updated as appropriate: allergies, current medications, past family history, past medical history, past social history, past surgical history and problem list.   Health Maintenance:  Normal pap in 01/2020  Review of Systems:  Pertinent items noted in HPI and remainder of comprehensive ROS otherwise negative.  Physical Exam:   Virtual visit, limiting physical exam. General: Alert, active, not in  distress Chest: no respiratory distress,  Psych: normal mood, affect, speech  Labs and Imaging No results found for this or any previous visit (from the past 168 hour(s)). No results found.     Assessment and Plan:  Encounter for initial prescription of vaginal ring hormonal contraceptive No contraindications noted to combined birth control.  I emphasized the importance of having a negative pregnancy test, prior to starting the NuvaRing.  Patient voices understanding of this. Prescription for NuvaRing sent. Recommend follow-up in 3 months for blood pressure check and ongoing monitoring.  Routine preventative health maintenance measures emphasized. Please refer to After Visit Summary for other counseling recommendations.   No follow-ups on file.   I spent 15 minutes dedicated to the care of this patient including pre-visit review of records, face to face time with the patient discussing her conditions and treatments and post visit orders.   Virtual Visit via Video Note  I connected with Monica [name] on 04/22/22 at  8:35 AM EST by a video enabled telemedicine application and verified that I am speaking with the correct person using two identifiers.  Location: Patient: Monica [name]. Provider: Dr Ladon Applebaum   I discussed the limitations of evaluation and management by telemedicine and the availability of in person appointments. The patient expressed understanding and agreed to proceed.  Sheppard Evens MD MPH OB Fellow, Faculty Practice [facility] 04/22/2022

## 2022-04-28 ENCOUNTER — Telehealth: Payer: Self-pay | Admitting: *Deleted

## 2022-04-28 NOTE — Telephone Encounter (Signed)
TC from pt requesting OB/GYN clearance for gastric sleeve surgery. Had TAB 4 wks ago. Dr. Jolayne Panther consulted. Advised pt needs in person appt for clearance. Pt notified. Call transferred to schedulers.

## 2022-05-14 ENCOUNTER — Other Ambulatory Visit (HOSPITAL_COMMUNITY)
Admission: RE | Admit: 2022-05-14 | Discharge: 2022-05-14 | Disposition: A | Discharge status: Home / Self Care | Payer: Medicaid Other | Source: Ambulatory Visit | Attending: Obstetrics | Admitting: Obstetrics

## 2022-05-14 ENCOUNTER — Ambulatory Visit (INDEPENDENT_AMBULATORY_CARE_PROVIDER_SITE_OTHER): Payer: Medicaid Other | Admitting: Obstetrics

## 2022-05-14 ENCOUNTER — Encounter: Payer: Self-pay | Admitting: Obstetrics

## 2022-05-14 VITALS — BP 121/82 | HR 86 | Ht 62.0 in | Wt 233.6 lb

## 2022-05-14 DIAGNOSIS — E66813 Obesity, class 3: Secondary | ICD-10-CM

## 2022-05-14 DIAGNOSIS — Z01419 Encounter for gynecological examination (general) (routine) without abnormal findings: Secondary | ICD-10-CM

## 2022-05-14 DIAGNOSIS — Z6841 Body Mass Index (BMI) 40.0 and over, adult: Secondary | ICD-10-CM

## 2022-05-14 DIAGNOSIS — Z3044 Encounter for surveillance of vaginal ring hormonal contraceptive device: Secondary | ICD-10-CM

## 2022-05-14 DIAGNOSIS — N898 Other specified noninflammatory disorders of vagina: Secondary | ICD-10-CM

## 2022-05-14 DIAGNOSIS — Z113 Encounter for screening for infections with a predominantly sexual mode of transmission: Secondary | ICD-10-CM

## 2022-05-14 DIAGNOSIS — N760 Acute vaginitis: Secondary | ICD-10-CM | POA: Diagnosis not present

## 2022-05-14 DIAGNOSIS — B9689 Other specified bacterial agents as the cause of diseases classified elsewhere: Secondary | ICD-10-CM

## 2022-05-14 MED ORDER — METRONIDAZOLE 500 MG PO TABS: 500.0000 mg | ORAL_TABLET | Freq: Two times a day (BID) | ORAL | 2 refills | Status: DC

## 2022-05-14 NOTE — Progress Notes (Signed)
Subjective:        Monica [name] is a 26 y.o. female here for a routine exam.  Current complaints: Malodorous vaginal discharge..    Personal health questionnaire:  Is patient Ashkenazi Jewish, have a family history of breast and/or ovarian cancer: no Is there a family history of uterine cancer diagnosed at age < 60, gastrointestinal cancer, urinary tract cancer, family member who is a Personnel officer syndrome-associated carrier: no Is the patient overweight and hypertensive, family history of diabetes, personal history of gestational diabetes, preeclampsia or PCOS: no Is patient over 19, have PCOS,  family history of premature CHD under age 12, diabetes, smoke, have hypertension or peripheral artery disease:  no At any time, has a partner hit, kicked or otherwise hurt or frightened you?: no Over the past 2 weeks, have you felt down, depressed or hopeless?: no Over the past 2 weeks, have you felt little interest or pleasure in doing things?:no   Gynecologic History Patient's last menstrual period was 05/12/2022. Contraception: NuvaRing vaginal inserts Last Pap: 2021. Results were: normal Last mammogram: n/a. Results were: n/a  Obstetric History OB History  Gravida Para Term Preterm AB Living  4 3 3   1 3   SAB IAB Ectopic Multiple Live Births    1   0 3    # Outcome Date GA Lbr Len/2nd Weight Sex Delivery Anes PTL Lv  4 IAB 04/03/22     TAB     3 Term 03/06/20 [redacted]w[redacted]d  8 lb 14.2 oz (4.031 kg) M CS-LTranv Spinal  LIV  2 Term 04/02/18 [redacted]w[redacted]d  9 lb 15.1 oz (4.51 kg) M CS-LTranv Spinal  LIV     Birth Comments: Term female infant, appears LGA  1 Term 11/05/12 [redacted]w[redacted]d  7 lb 15.5 oz (3.615 kg) F CS-LTranv Spinal  LIV     Birth Comments: preeclampsia pulmonary edema     Complications: Failure to Progress in First Stage, Preeclampsia    Past Medical History:  Diagnosis Date   Anxiety    Chronic hypertension affecting pregnancy 12/08/2017   [x ] Aspirin 81 mg daily after 12 weeks   Current antihypertensives:  None      Baseline and surveillance labs (pulled in from [facility], refresh links as needed)       Lab Results  Component  Value  Date     PLT  332  09/08/2019     CREATININE  0.46 (L)  09/08/2019     AST  14  09/08/2019     ALT  9  09/08/2019     PROTCRRATIO  11.61 (H)  11/03/2012            UPC                                          Depression    H/O pre-eclampsia in prior pregnancy, currently pregnant 11/10/2017   First pregnancy   Headache    Hypertension    Pregnancy induced hypertension    Vaginal Pap smear, abnormal     Past Surgical History:  Procedure Laterality Date   arthoscopic knee Right 2013   CESAREAN SECTION N/A 11/05/2012   Procedure: CESAREAN SECTION;  Surgeon: 11/07/2012, MD;  Location: [facility]S;  Service: Obstetrics;  Laterality: N/A;   CESAREAN SECTION N/A 04/02/2018   Procedure: CESAREAN SECTION;  Surgeon: 13/12/2017, MD;  Location: [facility]TH[facility];  Service: Obstetrics;  Laterality: N/A;   CESAREAN SECTION N/A 03/06/2020   Procedure: CESAREAN SECTION;  Surgeon: Levie Heritage, DO;  Location: [facility] LD [facility];  Service: Obstetrics;  Laterality: N/A;   COLPOSCOPY  05/07/2016   knee srthoscopy N/A      Current Outpatient Medications:    etonogestrel-ethinyl estradiol (NUVARING) 0.12-0.015 MG/24HR vaginal ring, Insert vaginally and leave in place for 3 consecutive weeks, then remove for 1 week., Disp: 1 each, Rfl: 4   metroNIDAZOLE (FLAGYL) 500 MG tablet, Take 1 tablet (500 mg total) by mouth 2 (two) times daily., Disp: 14 tablet, Rfl: 2   Multiple Vitamin (MULTIVITAMIN) tablet, Take 1 tablet by mouth daily., Disp: , Rfl:  Allergies  Allergen Reactions   Iodine Rash   Shellfish Allergy Hives and Rash    Social History   Tobacco Use   Smoking status: Never   Smokeless tobacco: Never  Substance Use Topics   Alcohol use: Yes    Comment: sometimes     Family History  Problem Relation Age of Onset   Hypertension Mother     Hypertension Father    Heart attack Maternal Grandmother    Hypertension Maternal Grandmother    Hearing loss Maternal Grandmother    Kidney disease Maternal Grandmother    Healthy Brother        x1   Asthma Brother    Healthy Sister        x1`   Intellectual disability Maternal Aunt    Cancer Neg Hx    Diabetes Neg Hx       Review of Systems  Constitutional: negative for fatigue and weight loss Respiratory: negative for cough and wheezing Cardiovascular: negative for chest pain, fatigue and palpitations Gastrointestinal: negative for abdominal pain and change in bowel habits Musculoskeletal:negative for myalgias Neurological: positive for chronic headaches Behavioral/Psych: negative for abusive relationship, depression Endocrine: negative for temperature intolerance    Genitourinary: positive for malodorous vaginal discharge.  negative for abnormal menstrual periods, genital lesions, hot flashes, sexual problems  Integument/breast: negative for breast lump, breast tenderness, nipple discharge and skin lesion(s)    Objective:       BP 121/82   Pulse 86   Ht 5\' 2"  (1.575 m)   Wt 233 lb 9.6 oz (106 kg)   LMP 05/12/2022 Comment: NuvaRing  Breastfeeding No   BMI 42.73 kg/m  General:   Alert and no distress  Skin:   no rash or abnormalities  Lungs:   clear to auscultation bilaterally  Heart:   regular rate and rhythm, S1, S2 normal, no murmur, click, rub or gallop  Breasts:   normal without suspicious masses, skin or nipple changes or axillary nodes  Abdomen:  normal findings: no organomegaly, soft, non-tender and no hernia  Pelvis:  External genitalia: normal general appearance Urinary system: urethral meatus normal and bladder without fullness, nontender Vaginal: normal without tenderness, induration or masses Cervix: normal appearance Adnexa: normal bimanual exam Uterus: anteverted and non-tender, normal size   Lab Review Urine pregnancy test Labs reviewed  yes Radiologic studies reviewed no  I have spent a total of 20 minutes of face-to-face time, excluding clinical staff time, reviewing notes and preparing to see patient, ordering tests and/or medications, and counseling the patient.   Assessment:    1. Encounter for gynecological examination with Papanicolaou smear of cervix Rx - Cytology - PAP( [place])  2. Vaginal discharge Rx: - Cervicovaginal ancillary only( [place])  3. Screen  for STD (sexually transmitted disease) Rx: - HIV antibody (with reflex) - RPR - Hepatitis B Surface AntiGEN - Hepatitis C Antibody  4. BV (bacterial vaginosis) Rx: - metroNIDAZOLE (FLAGYL) 500 MG tablet; Take 1 tablet (500 mg total) by mouth 2 (two) times daily.  Dispense: 14 tablet; Refill: 2  5. Encounter for surveillance of vaginal ring hormonal contraceptive device - doing well with NubvaRing  6. Class 3 severe obesity without serious comorbidity with body mass index (BMI) of 40.0 to 44.9 in adult, unspecified obesity type (HCC) - weight reduction recommeneded     Plan:    Education reviewed: calcium supplements, depression evaluation, low fat, low cholesterol diet, safe sex/STD prevention, self breast exams, and weight bearing exercise.   Meds ordered this encounter  Medications   metroNIDAZOLE (FLAGYL) 500 MG tablet    Sig: Take 1 tablet (500 mg total) by mouth 2 (two) times daily.    Dispense:  14 tablet    Refill:  2   Orders Placed This Encounter  Procedures   HIV antibody (with reflex)   RPR   Hepatitis B Surface AntiGEN   Hepatitis C Antibody    Thalya Fouche A. Clearance Coots MD 05/14/2022

## 2022-05-14 NOTE — Progress Notes (Signed)
Pt is in the office for annual. Last pap 09/08/2019 Currently on Nuvaring Pt reports that she had a TAB on 04/03/22 and started NuvaRing on 04/27/22 Desires STD testing today.

## 2022-05-15 LAB — CERVICOVAGINAL ANCILLARY ONLY
Bacterial Vaginitis (gardnerella): NEGATIVE
Candida Glabrata: NEGATIVE
Candida Vaginitis: NEGATIVE
Chlamydia: NEGATIVE
Comment: NEGATIVE
Comment: NEGATIVE
Comment: NEGATIVE
Comment: NEGATIVE
Comment: NEGATIVE
Comment: NORMAL
Neisseria Gonorrhea: NEGATIVE
Trichomonas: NEGATIVE

## 2022-05-21 LAB — CYTOLOGY - PAP: Diagnosis: NEGATIVE

## 2022-05-28 ENCOUNTER — Other Ambulatory Visit: Payer: Medicaid Other

## 2022-06-04 HISTORY — PX: LAPAROSCOPIC GASTRIC SLEEVE RESECTION: SHX5895

## 2022-07-02 ENCOUNTER — Telehealth (INDEPENDENT_AMBULATORY_CARE_PROVIDER_SITE_OTHER): Payer: Medicaid Other

## 2022-07-02 DIAGNOSIS — Z3009 Encounter for other general counseling and advice on contraception: Secondary | ICD-10-CM

## 2022-07-02 NOTE — Progress Notes (Signed)
Pt presents for [facility] consult. Pt reports mood changes with Nuvaring. Desires non hormonal BC method.

## 2022-07-02 NOTE — Progress Notes (Signed)
GYNECOLOGY VIRTUAL VISIT ENCOUNTER NOTE  Provider location: [facility] [place] at [facility]   Patient location: Home  I connected with Monica [name] on 07/02/22 at  4:10 PM EST by MyChart Video Encounter and verified that I am speaking with the correct person using two identifiers.   I discussed the limitations, risks, security and privacy concerns of performing an evaluation and management service virtually and the availability of in person appointments. I also discussed with the patient that there may be a patient responsible charge related to this service. The patient expressed understanding and agreed to proceed.   History:  Monica [name] is a 27 y.o. [phone] female being evaluated today for birth control consult.  She reports she inserts her Nuva ring and is coping well the first couple of days.  She states that by the time of removal she is moody and does not enjoy this feeling.  She would like more information on non-hormonal options for birth control management.  Patient also states she had recent placement of gastric sleeve and would like to avoid medications that would be a contraindication.         Past Medical History:  Diagnosis Date   Anxiety    Chronic hypertension affecting pregnancy 12/08/2017   [x ] Aspirin 81 mg daily after 12 weeks  Current antihypertensives:  None      Baseline and surveillance labs (pulled in from [facility], refresh links as needed)       Lab Results  Component  Value  Date     PLT  332  09/08/2019     CREATININE  0.46 (L)  09/08/2019     AST  14  09/08/2019     ALT  9  09/08/2019     PROTCRRATIO  11.61 (H)  11/03/2012            UPC                                          Depression    H/O pre-eclampsia in prior pregnancy, currently pregnant 11/10/2017   First pregnancy   Headache    Hypertension    Pregnancy induced hypertension    Vaginal Pap smear, abnormal    Past Surgical History:  Procedure Laterality Date    arthoscopic knee Right 2013   CESAREAN SECTION N/A 11/05/2012   Procedure: CESAREAN SECTION;  Surgeon: Lahoma Crocker, MD;  Location: [place] [facility];  Service: Obstetrics;  Laterality: N/A;   CESAREAN SECTION N/A 04/02/2018   Procedure: CESAREAN SECTION;  Surgeon: Guss Bunde, MD;  Location: [place];  Service: Obstetrics;  Laterality: N/A;   CESAREAN SECTION N/A 03/06/2020   Procedure: CESAREAN SECTION;  Surgeon: Truett Mainland, DO;  Location: [place] LD [facility];  Service: Obstetrics;  Laterality: N/A;   COLPOSCOPY  05/07/2016   knee srthoscopy N/A    The following portions of the patient's history were reviewed and updated as appropriate: allergies, current medications, past family history, past medical history, past social history, past surgical history and problem list.   Health Maintenance:  Normal pap on Dec 2023.  No mammogram on file d/t age.   Review of Systems:  Pertinent items noted in HPI and remainder of comprehensive ROS otherwise negative.  Physical Exam:   General:  Alert, oriented and cooperative. Patient appears to be in no acute  distress.  Mental Status: Normal mood and affect. Normal behavior. Normal judgment and thought content.   Respiratory: Normal respiratory effort, no problems with respiration noted  Rest of physical exam deferred due to type of encounter  Labs and Imaging No results found for this or any previous visit (from the past 336 hour(s)). No results found.     Assessment and Plan:     1. Birth control counseling -Reviewed ParaGard risks, benefits, and potential side effects. -Discussed placement of IUD and premedication with no previous vaginal deliveries. -Reviewed Cytotec placement night before as well as Xanax 30 minutes prior to appt to improve relaxation. -Further informed of post procedure management of pain with Ibuprofen 600mg . -Patient agreeable with POC and desires to proceed. -Message sent to front office to schedule accordingly.         I discussed the assessment and treatment plan with the patient. The patient was provided an opportunity to ask questions and all were answered. The patient agreed with the plan and demonstrated an understanding of the instructions.   The patient was advised to call back or seek an in-person evaluation/go to the ED if the symptoms worsen or if the condition fails to improve as anticipated.  I provided 8 minutes of face-to-face time during this encounter.   Maryann Conners, [place] for [employer], [place]

## 2022-07-21 ENCOUNTER — Encounter: Payer: Self-pay | Admitting: Medical

## 2022-07-21 ENCOUNTER — Ambulatory Visit (INDEPENDENT_AMBULATORY_CARE_PROVIDER_SITE_OTHER): Payer: Medicaid Other | Admitting: Medical

## 2022-07-21 VITALS — BP 119/73 | HR 89

## 2022-07-21 DIAGNOSIS — Z3009 Encounter for other general counseling and advice on contraception: Secondary | ICD-10-CM | POA: Diagnosis not present

## 2022-07-21 DIAGNOSIS — Z3201 Encounter for pregnancy test, result positive: Secondary | ICD-10-CM

## 2022-07-21 LAB — POCT URINE PREGNANCY: Preg Test, Ur: POSITIVE — AB

## 2022-07-21 NOTE — Progress Notes (Signed)
  History:  [name] is a 27 y.o. [id number] who presents to clinic today for IUD insertion. She discontinued the Nuvaring last month due to unwanted side effects. She has had unprotected sex at least twice in the last 2-3 weeks. She denies abdominal pain or bleeding today. Pre-procedure UPT is positive today.   The following portions of the patient's history were reviewed and updated as appropriate: allergies, current medications, family history, past medical history, social history, past surgical history and problem list.  Review of Systems:  Review of Systems  Constitutional:  Negative for fever.  Gastrointestinal:  Negative for abdominal pain.  Genitourinary:        Neg - vaginal bleeding      Objective:  Physical Exam BP 119/73   Pulse 89   LMP 06/16/2022 Comment: NuvaRing Physical Exam Vitals and nursing note reviewed.  Constitutional:      Appearance: Normal appearance. She is not ill-appearing.  Cardiovascular:     Rate and Rhythm: Normal rate.  Pulmonary:     Effort: Pulmonary effort is normal.  Abdominal:     General: Abdomen is flat. There is no distension.     Palpations: Abdomen is soft.  Skin:    General: Skin is warm and dry.     Findings: No erythema.  Neurological:     Mental Status: She is alert and oriented to person, place, and time.  Psychiatric:        Mood and Affect: Mood normal.    Labs and Imaging No results found for this or any previous visit (from the past 24 hour(s)).   Health Maintenance Due  Topic Date Due   COVID-19 Vaccine (1) Never done   INFLUENZA VACCINE  Never done    Labs, imaging and previous visits in Epic reviewed  Assessment & Plan:  1. Pregnancy test positive - Patient advised that she is 41w0dbased on LMP given  - Advised that is she desires to continue the pregnancy we would be happy to start prenatal care in 3-5 weeks  - First trimester warning signs discussed  - Patient will call office when  ready to schedule   WLuvenia Redden PA-C 07/21/2022 4:04 PM

## 2022-07-21 NOTE — Progress Notes (Signed)
Unprotected sex <24 hrs, no condom, no nuvaring

## 2022-08-08 IMAGING — US US MFM OB FOLLOW-UP
1 series · 13 of 28 positions shown · non-contrast
Comparison: none

[Series 1: us mfm ob follow-up · 13 of 29 slices shown]
[im 2/29]
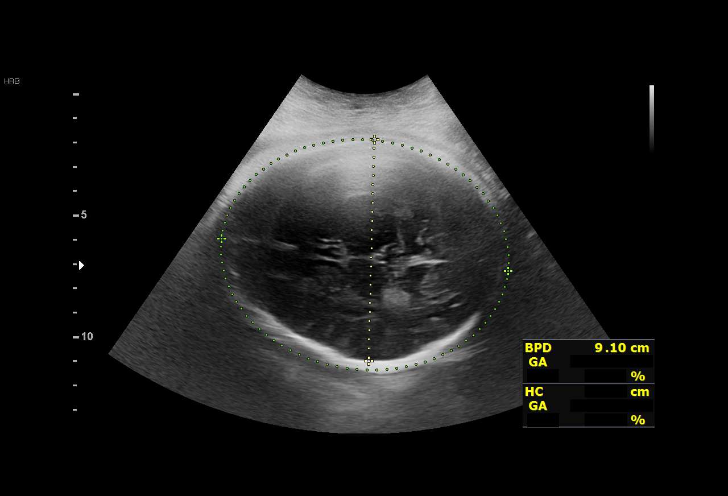
[im 4/29]
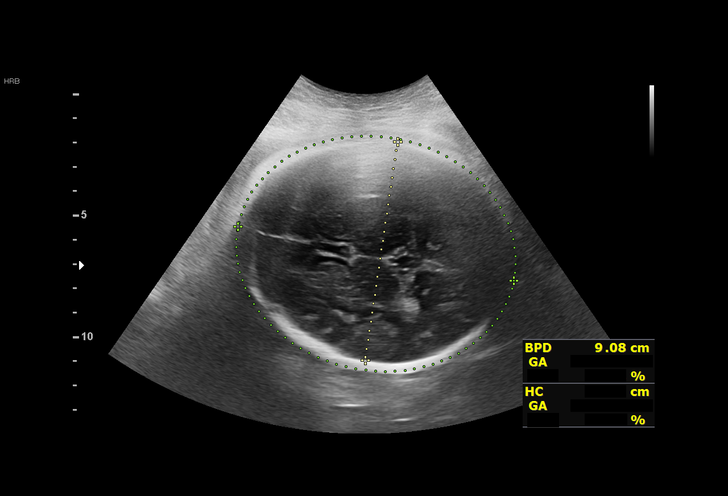
[im 6/29]
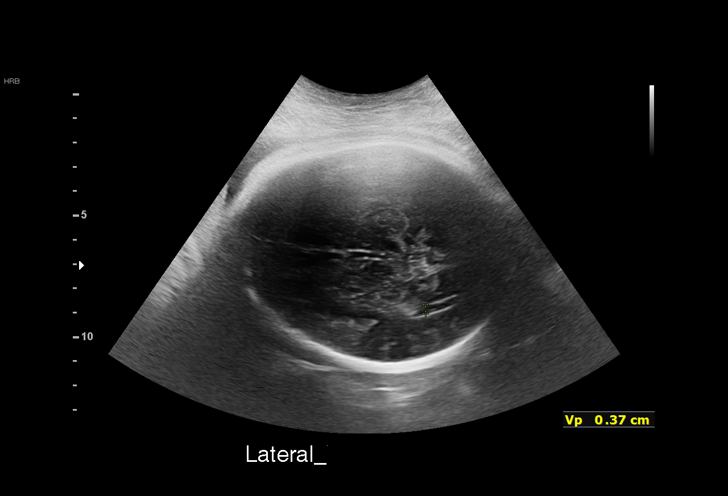
[im 8/29]
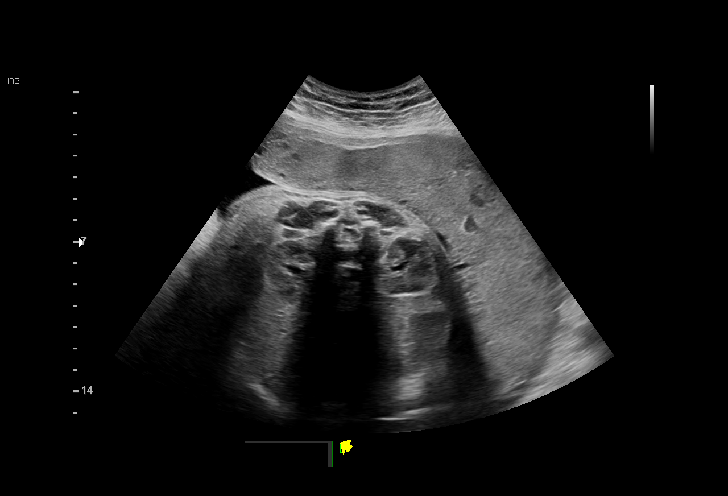
[im 10/29]
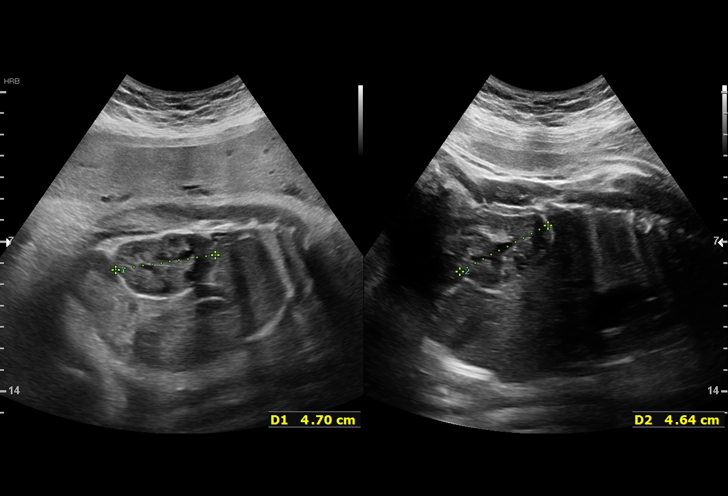
[im 12/29]
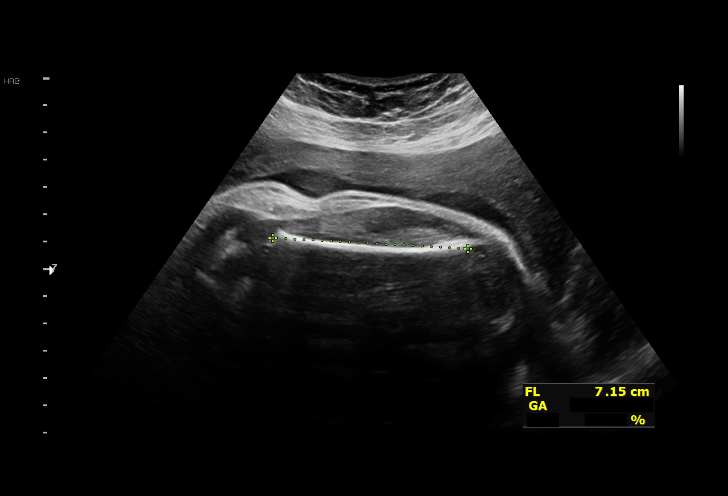
[im 15/29]
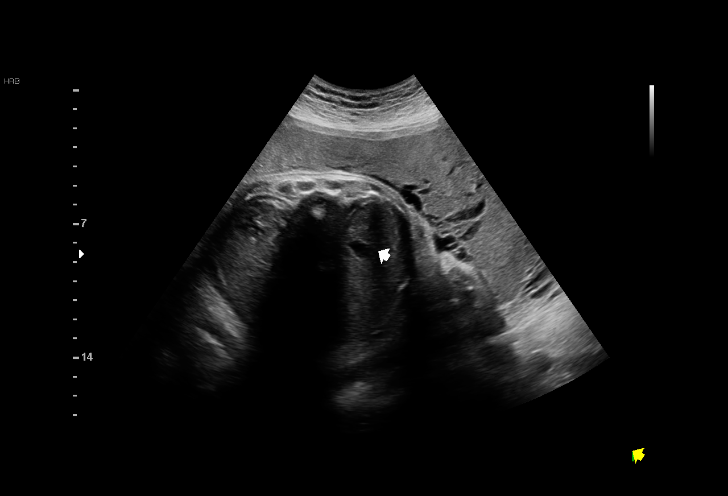
[im 17/29]
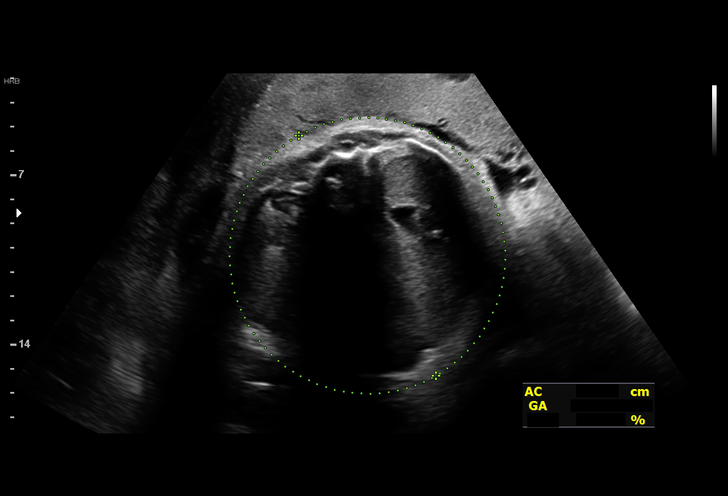
[im 19/29]
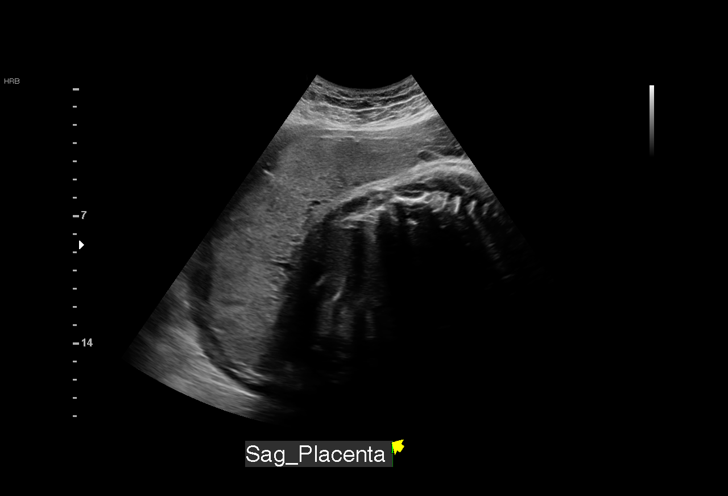
[im 21/29]
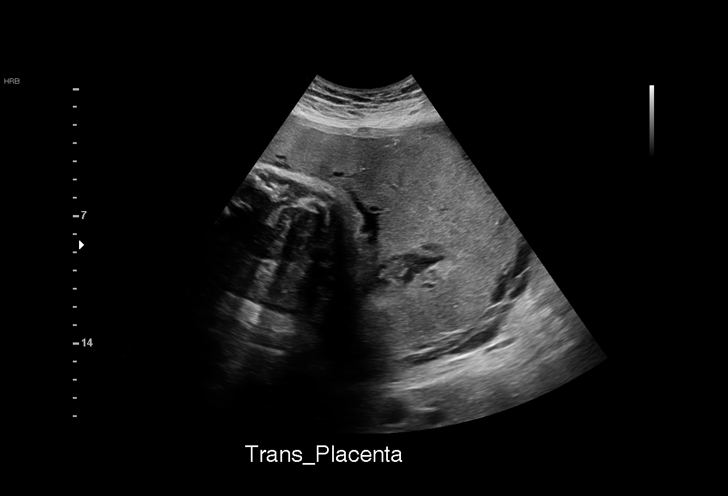
[im 23/29]
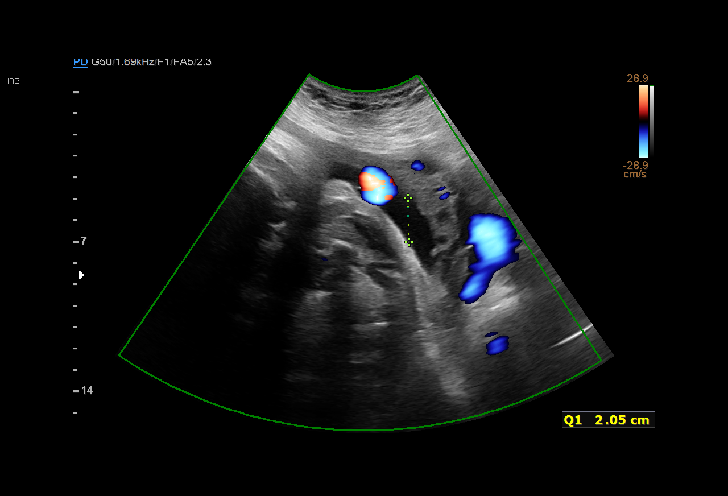
[im 25/29]
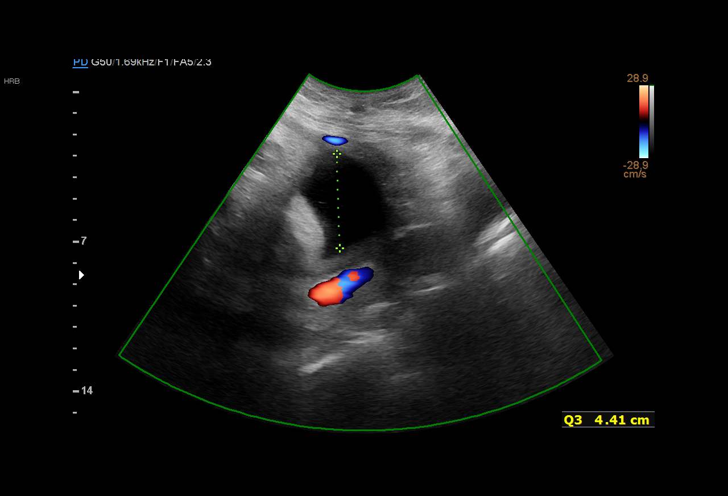
[im 27/29]
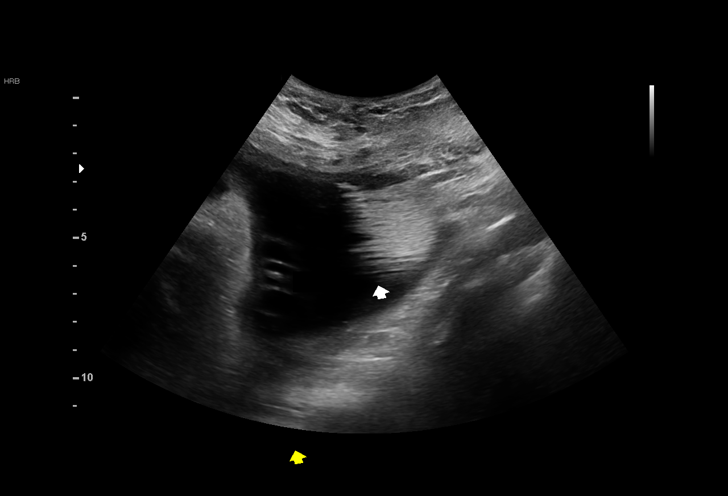

[13 of 28 positions shown; findings below may reference images not displayed]

Ref. Address:     Faculty

Indications

 Obesity complicating pregnancy, second
 trimester (BMI 43)
 Encounter for other antenatal screening
 follow-up (Low risk NIPS)
 Hypertension - Chronic/Pre-existing
 Previous cesarean delivery, antepartum
 Poor obstetric history: Previous
 preeclampsia / eclampsia/gestational HTN
 37 weeks gestation of pregnancy
Vital Signs

                                                Height:        5'1"
Fetal Evaluation

 Num Of Fetuses:         1
 Fetal Heart Rate(bpm):  160
 Cardiac Activity:       Observed
 Presentation:           Cephalic
 Placenta:               Anterior
 P. Cord Insertion:      Previously Visualized

 Amniotic Fluid
 AFI FV:      Within normal limits

 AFI Sum(cm)     %Tile       Largest Pocket(cm)
 9.78            23
 RUQ(cm)       RLQ(cm)       LUQ(cm)        LLQ(cm)

Biometry

 BPD:      90.8  mm     G. Age:  36w 6d         55  %    CI:        74.08   %    70 - 86
                                                         FL/HC:      21.3   %    20.8 -
 HC:       335   mm     G. Age:  38w 2d         51  %    HC/AC:      0.93        0.92 -
 AC:      361.4  mm     G. Age:  40w 0d       > 99  %    FL/BPD:     78.6   %    71 - 87
 FL:       71.4  mm     G. Age:  36w 4d         32  %    FL/AC:      19.8   %    20 - 24

 LV:        3.7  mm

 Est. FW:    3676  gm    7 lb 14 oz      89  %
OB History

 Blood Type:   O+
 Gravidity:    3         Term:   2        Prem:   0        SAB:   0
 TOP:          0       Ectopic:  0        Living: 2
Gestational Age

 LMP:           37w 2d        Date:  06/07/19                 EDD:   03/13/20
 U/S Today:     38w 0d                                        EDD:   03/08/20
 Best:          37w 2d     Det. By:  LMP  (06/07/19)          EDD:   03/13/20
Anatomy

 Cranium:               Appears normal         Aortic Arch:            Previously seen
 Cavum:                 Appears normal         Ductal Arch:            Previously seen
 Ventricles:            Appears normal         Diaphragm:              Previously seen
 Choroid Plexus:        Previously seen        Stomach:                Appears normal, left
                                                                       sided
 Cerebellum:            Previously seen        Abdomen:                Appears normal
 Posterior Fossa:       Previously seen        Abdominal Wall:         Previously seen
 Nuchal Fold:           Previously seen        Cord Vessels:           Previously seen
 Face:                  Orbits and profile     Kidneys:                Appear normal
                        previously seen
 Lips:                  Previously seen        Bladder:                Appears normal
 Thoracic:              Appears normal         Spine:                  Limited views
                                                                       previously seen
 Heart:                 Previously seen        Upper Extremities:      Previously seen
 RVOT:                  Previously seen        Lower Extremities:      Previously seen
 LVOT:                  Previously seen

 Other:  Fetus appears to be a male prev seen.  Technically difficult due to
         maternal habitus and fetal position.
Cervix Uterus Adnexa

 Cervix
 Not visualized (advanced GA >85wks)
Comments

 This patient was seen for a follow up growth scan due to
 maternal obesity and chronic hypertension that is not
 currently treated with any medications.  She denies any
 problems since her last exam.
 She was informed that the fetal growth and amniotic fluid
 level appears appropriate for her gestational age.
 As the fetal growth is within normal limits, no further exams
 were scheduled in our office.

## 2022-08-14 ENCOUNTER — Other Ambulatory Visit (INDEPENDENT_AMBULATORY_CARE_PROVIDER_SITE_OTHER): Payer: Medicaid Other

## 2022-08-14 ENCOUNTER — Other Ambulatory Visit (HOSPITAL_COMMUNITY)
Admission: RE | Admit: 2022-08-14 | Discharge: 2022-08-14 | Disposition: A | Discharge status: Home / Self Care | Payer: Medicaid Other | Source: Ambulatory Visit | Attending: Obstetrics and Gynecology | Admitting: Obstetrics and Gynecology

## 2022-08-14 ENCOUNTER — Ambulatory Visit (INDEPENDENT_AMBULATORY_CARE_PROVIDER_SITE_OTHER): Payer: Medicaid Other

## 2022-08-14 VITALS — BP 120/72 | HR 81 | Ht 62.0 in | Wt 201.3 lb

## 2022-08-14 DIAGNOSIS — Z3A01 Less than 8 weeks gestation of pregnancy: Secondary | ICD-10-CM | POA: Diagnosis not present

## 2022-08-14 DIAGNOSIS — O3680X0 Pregnancy with inconclusive fetal viability, not applicable or unspecified: Secondary | ICD-10-CM

## 2022-08-14 DIAGNOSIS — Z3481 Encounter for supervision of other normal pregnancy, first trimester: Secondary | ICD-10-CM | POA: Diagnosis not present

## 2022-08-14 DIAGNOSIS — Z3480 Encounter for supervision of other normal pregnancy, unspecified trimester: Secondary | ICD-10-CM

## 2022-08-14 DIAGNOSIS — Z3492 Encounter for supervision of normal pregnancy, unspecified, second trimester: Secondary | ICD-10-CM | POA: Insufficient documentation

## 2022-08-14 MED ORDER — VITAFOL-OB PO TABS: 1.0000 | ORAL_TABLET | Freq: Every day | ORAL | 11 refills | Status: DC

## 2022-08-14 MED ORDER — BLOOD PRESSURE KIT DEVI: 1.0000 | 0 refills | Status: DC

## 2022-08-14 NOTE — Progress Notes (Signed)
New OB Intake  I connected with Monica [name]  on 08/14/22 at  9:15 AM EDT by in person and verified that I am speaking with the correct person using two identifiers. Nurse is located at [facility] and pt is located at [place].  I discussed the limitations, risks, security and privacy concerns of performing an evaluation and management service by telephone and the availability of in person appointments. I also discussed with the patient that there may be a patient responsible charge related to this service. The patient expressed understanding and agreed to proceed.  I explained I am completing New OB Intake today. We discussed EDD of 03/29/23 that is based on first trimester u/s. Pt is G5/P3013. I reviewed her allergies, medications, Medical/Surgical/OB history, and appropriate screenings. I informed her of [facility] services. [facility] information placed in AVS. Based on history, this is a low risk pregnancy.  Patient Active Problem List   Diagnosis Date Noted   BMI 40.0-44.9, adult ([place]) 02/15/2020   History of C-section 09/15/2017   Hypertension 09/15/2017    Concerns addressed today  Delivery Plans Plans to deliver at [facility]. Patient given information for [facility] Healthy Baby website for more information about Women's and [place]. Patient is not interested in water birth. Offered upcoming OB visit with CNM to discuss further.  MyChart/Babyscripts MyChart access verified. I explained pt will have some visits in office and some virtually. Babyscripts instructions given and order placed. Patient verifies receipt of registration text/e-mail. Account successfully created and app downloaded.  Blood Pressure Cuff/Weight Scale Blood pressure cuff ordered for patient to pick-up from [employer]. Explained after first prenatal appt pt will check weekly and document in 26. Patient does have weight scale.  Anatomy US Explained first scheduled [place] will be around 19 weeks. Dating  and viability u/s performed today. Anatomy US TBD.   Labs Discussed Johnsie Cancel genetic screening with patient. Would like both Panorama and Horizon drawn at new OB visit. Routine prenatal labs needed.  COVID Vaccine Patient has not had COVID vaccine.   Is patient a CenteringPregnancy candidate?  Declined Declined due to Group setting Not a candidate due to  If accepted,   Social Determinants of Health Food Insecurity: Patient denies food insecurity. WIC Referral: Patient is interested in referral to [facility].  Transportation: Patient denies transportation needs. Childcare: Discussed no children allowed at ultrasound appointments. Offered childcare services; patient declines childcare services at this time.  Interested in [place]? If yes, send referral and doula dot phrase.   First visit review I reviewed new OB appt with patient. I explained they will have a provider visit that includes pap smear, genetic screening, and discuss plan of care for pregnancy. Explained pt will be seen by Naaman Plummer Autry-Lott at first visit; encounter routed to appropriate provider. Explained that patient will be seen by pregnancy navigator following visit with provider.   Lucianne Lei, RN 08/14/2022  9:22 AM

## 2022-08-15 LAB — CERVICOVAGINAL ANCILLARY ONLY
Bacterial Vaginitis (gardnerella): NEGATIVE
Candida Glabrata: NEGATIVE
Candida Vaginitis: POSITIVE — AB
Chlamydia: NEGATIVE
Comment: NEGATIVE
Comment: NEGATIVE
Comment: NEGATIVE
Comment: NEGATIVE
Comment: NEGATIVE
Comment: NORMAL
Neisseria Gonorrhea: NEGATIVE
Trichomonas: NEGATIVE

## 2022-08-15 LAB — CBC/D/PLT+RPR+RH+ABO+RUBIGG...
Antibody Screen: NEGATIVE
Basophils Absolute: 0 10*3/uL (ref 0.0–0.2)
Basos: 1 %
EOS (ABSOLUTE): 0.1 10*3/uL (ref 0.0–0.4)
Eos: 2 %
HCV Ab: NONREACTIVE
HIV Screen 4th Generation wRfx: NONREACTIVE
Hematocrit: 39.6 % (ref 34.0–46.6)
Hemoglobin: 12.6 g/dL (ref 11.1–15.9)
Hepatitis B Surface Ag: NEGATIVE
Immature Grans (Abs): 0 10*3/uL (ref 0.0–0.1)
Immature Granulocytes: 0 %
Lymphocytes Absolute: 1.7 10*3/uL (ref 0.7–3.1)
Lymphs: 28 %
MCH: 25 pg — ABNORMAL LOW (ref 26.6–33.0)
MCHC: 31.8 g/dL (ref 31.5–35.7)
MCV: 79 fL (ref 79–97)
Monocytes Absolute: 0.5 10*3/uL (ref 0.1–0.9)
Monocytes: 8 %
Neutrophils Absolute: 3.7 10*3/uL (ref 1.4–7.0)
Neutrophils: 61 %
Platelets: 311 10*3/uL (ref 150–450)
RBC: 5.04 x10E6/uL (ref 3.77–5.28)
RDW: 15.2 % (ref 11.7–15.4)
RPR Ser Ql: NONREACTIVE
Rh Factor: POSITIVE
Rubella Antibodies, IGG: 2.19 index (ref >0.99)
WBC: 6 10*3/uL (ref 3.4–10.8)

## 2022-08-15 LAB — HCV INTERPRETATION

## 2022-08-16 LAB — URINE CULTURE, OB REFLEX

## 2022-08-16 LAB — CULTURE, OB URINE

## 2022-08-19 ENCOUNTER — Other Ambulatory Visit: Payer: Self-pay

## 2022-08-19 DIAGNOSIS — B379 Candidiasis, unspecified: Secondary | ICD-10-CM

## 2022-08-19 MED ORDER — TERCONAZOLE 0.4 % VA CREA: 1.0000 | TOPICAL_CREAM | Freq: Every day | VAGINAL | 0 refills | Status: DC

## 2022-09-24 ENCOUNTER — Encounter: Payer: Self-pay | Admitting: Family Medicine

## 2022-09-24 ENCOUNTER — Ambulatory Visit (INDEPENDENT_AMBULATORY_CARE_PROVIDER_SITE_OTHER): Payer: Medicaid Other | Admitting: Family Medicine

## 2022-09-24 VITALS — BP 107/75 | HR 78 | Wt 186.4 lb

## 2022-09-24 DIAGNOSIS — Z3481 Encounter for supervision of other normal pregnancy, first trimester: Secondary | ICD-10-CM

## 2022-09-24 DIAGNOSIS — Z3480 Encounter for supervision of other normal pregnancy, unspecified trimester: Secondary | ICD-10-CM

## 2022-09-24 DIAGNOSIS — Z1339 Encounter for screening examination for other mental health and behavioral disorders: Secondary | ICD-10-CM | POA: Diagnosis not present

## 2022-09-24 DIAGNOSIS — Z3A13 13 weeks gestation of pregnancy: Secondary | ICD-10-CM | POA: Diagnosis not present

## 2022-09-24 DIAGNOSIS — Z98891 History of uterine scar from previous surgery: Secondary | ICD-10-CM

## 2022-09-24 DIAGNOSIS — Z9884 Bariatric surgery status: Secondary | ICD-10-CM

## 2022-09-24 MED ORDER — BLOOD PRESSURE KIT DEVI: 1.0000 | 0 refills | Status: DC

## 2022-09-24 MED ORDER — VITAFOL-OB PO TABS: 1.0000 | ORAL_TABLET | Freq: Two times a day (BID) | ORAL | 11 refills | Status: DC

## 2022-09-24 NOTE — Progress Notes (Signed)
History:   Monica [name] is a 27 y.o. [id number] at [redacted]w[redacted]d by early ultrasound being seen today for her first obstetrical visit.  Her obstetrical history is significant for  history of HTN, Hx of C/S, hx of gastric bypass . Patient does intend to breast feed. Pregnancy history fully reviewed.  Patient reports no complaints.      HISTORY: OB History  Gravida Para Term Preterm AB Living  5 3 3  0 1 3  SAB IAB Ectopic Multiple Live Births  0 1 0 0 3    # Outcome Date GA Lbr Len/2nd Weight Sex Delivery Anes PTL Lv  5 Current           4 IAB 04/03/22     TAB     3 Term 03/06/20 [redacted]w[redacted]d  8 lb 14.2 oz (4.031 kg) M CS-LTranv Spinal  LIV     Name: Edrington,BOY Natsumi     Apgar1: 9  Apgar5: 9  2 Term 04/02/18 [redacted]w[redacted]d  9 lb 15.1 oz (4.51 kg) M CS-LTranv Spinal  LIV     Birth Comments: Term female infant, appears LGA     Name: Guerrier,BOY Tiyanna     Apgar1: 9  Apgar5: 10  1 Term 11/05/12 [redacted]w[redacted]d  7 lb 15.5 oz (3.615 kg) F CS-LTranv Spinal  LIV     Birth Comments: preeclampsia pulmonary edema     Complications: Failure to Progress in First Stage, Preeclampsia     Name: Emmer,GIRL Peri     Apgar1: 9  Apgar5: 9    Last pap smear was done 05/14/2022 and was normal  Past Medical History:  Diagnosis Date   Anxiety    Chronic hypertension affecting pregnancy 12/08/2017   [x ] Aspirin 81 mg daily after 12 weeks  Current antihypertensives:  None      Baseline and surveillance labs (pulled in from [facility], refresh links as needed)       Lab Results  Component  Value  Date     PLT  332  09/08/2019     CREATININE  0.46 (L)  09/08/2019     AST  14  09/08/2019     ALT  9  09/08/2019     PROTCRRATIO  11.61 (H)  11/03/2012            UPC                                          Depression    H/O pre-eclampsia in prior pregnancy, currently pregnant 11/10/2017   First pregnancy   Headache    Hypertension    Pregnancy induced hypertension    Vaginal Pap smear, abnormal    Past Surgical History:  Procedure  Laterality Date   arthoscopic knee Right 2013   CESAREAN SECTION N/A 11/05/2012   Procedure: CESAREAN SECTION;  Surgeon: Antionette Char, MD;  Location: [facility];  Service: Obstetrics;  Laterality: N/A;   CESAREAN SECTION N/A 04/02/2018   Procedure: CESAREAN SECTION;  Surgeon: Lesly Dukes, MD;  [facility];  Service: Obstetrics;  Laterality: N/A;   CESAREAN SECTION N/A 03/06/2020   Procedure: CESAREAN SECTION;  Surgeon: Levie Heritage, DO;  Location: [facility];  Service: Obstetrics;  Laterality: N/A;   COLPOSCOPY  05/07/2016   LAPAROSCOPIC GASTRIC SLEEVE RESECTION  06/04/2022   Family History  Problem Relation Age of Onset  Hypertension Mother    Hypertension Father    Healthy Sister        x1`   Healthy Brother        x1   Asthma Brother    Heart attack Maternal Grandmother    Hypertension Maternal Grandmother    Hearing loss Maternal Grandmother    Kidney disease Maternal Grandmother    Intellectual disability Maternal Aunt    Cancer Neg Hx    Diabetes Neg Hx    Social History   Tobacco Use   Smoking status: Never   Smokeless tobacco: Never  Vaping Use   Vaping Use: Former  Substance Use Topics   Alcohol use: Not Currently    Comment: sometimes, not since confirmed preg   Drug use: Not Currently    Types: Marijuana    Comment: not since confirmed pregnancy   Allergies  Allergen Reactions   Iodine Rash   Shellfish Allergy Hives and Rash   Current Outpatient Medications on File Prior to Visit  Medication Sig Dispense Refill   cetirizine (ZYRTEC) 10 MG chewable tablet Chew 10 mg by mouth daily.     cholecalciferol (VITAMIN D3) 25 MCG (1000 UNIT) tablet Take 1,000 Units by mouth daily.     diphenhydrAMINE (BENADRYL) 25 mg capsule Take 25 mg by mouth every 6 (six) hours as needed for allergies.     Prenatal MV & Min w/FA-DHA (ONE A DAY PRENATAL PO) Take 2 tablets by mouth daily.     Prenatal Vit-Fe Fumarate-FA (VITAFOL-OB) TABS Take 1 tablet  by mouth daily. 30 tablet 11   Blood Pressure Monitoring (BLOOD PRESSURE KIT) DEVI 1 kit by Does not apply route once a week. 1 each 0   etonogestrel-ethinyl estradiol (NUVARING) 0.12-0.015 MG/24HR vaginal ring Insert vaginally and leave in place for 3 consecutive weeks, then remove for 1 week. (Patient not taking: Reported on 09/24/2022) 1 each 4   metroNIDAZOLE (FLAGYL) 500 MG tablet Take 1 tablet (500 mg total) by mouth 2 (two) times daily. (Patient not taking: Reported on 09/24/2022) 14 tablet 2   Multiple Vitamin (MULTIVITAMIN) tablet Take 1 tablet by mouth daily. (Patient not taking: Reported on 09/24/2022)     omeprazole (PRILOSEC OTC) 20 MG tablet Take 20 mg by mouth daily. (Patient not taking: Reported on 08/14/2022)     terconazole (TERAZOL 7) 0.4 % vaginal cream Place 1 applicator vaginally at bedtime. (Patient not taking: Reported on 09/24/2022) 45 g 0   No current facility-administered medications on file prior to visit.    Review of Systems Pertinent items noted in HPI and remainder of comprehensive ROS otherwise negative. Physical Exam:   Vitals:   09/24/22 0941  BP: 107/75  Pulse: 78  Weight: 186 lb 6.4 oz (84.6 kg)     Constitutional: Well-developed, well-nourished pregnant female in no acute distress.  HEENT: PERRLA Skin: normal color and turgor, no rash Cardiovascular: normal rate & rhythm, no murmur Respiratory: normal effort, lung sounds clear throughout GI: Abd soft, non-tender, pos BS x 4, gravid appropriate for gestational age MS: Extremities nontender, no edema, normal ROM Neurologic: Alert and oriented x 4.  FHR: 160  Assessment:    Pregnancy: [id number] Patient Active Problem List   Diagnosis Date Noted   History of gastric bypass 09/24/2022   Supervision of normal pregnancy in second trimester 08/14/2022   BMI 40.0-44.9, adult ([facility]) 02/15/2020   History of C-section 09/15/2017   Hypertension 09/15/2017     Plan:    1. Supervision of  other normal  pregnancy, antepartum No acute concerns.  - [place] MFM OB COMP + 14 WK; Future - Blood Pressure Monitoring (BLOOD PRESSURE KIT) DEVI; 1 kit by Does not apply route once a week.  Dispense: 1 each; Refill: 0  2. History of gastric bypass Planning to follow with bariatrics during pregnancy. They have requested PNV BID.   3. History of C-section X3. Primary for failure to progress. Plan for repeat C/S.  4. [redacted] weeks gestation of pregnancy Follow up in 4 weeks or sooner if clinically indicated.   Initial labs reviewed. Continue prenatal vitamins. Problem list reviewed and updated. Genetic Screening discussed, : ordered. Ultrasound discussed; fetal anatomic survey: requested. Anticipatory guidance about prenatal visits given including labs, ultrasounds, and testing. No follow-ups on file.     Lavonda Jumbo, DO OB Fellow, Faculty [facility], Center for [facility] 09/25/2022, 4:45 PM

## 2022-09-25 ENCOUNTER — Other Ambulatory Visit: Payer: Self-pay | Admitting: *Deleted

## 2022-09-25 MED ORDER — PRENATE ELITE 20-0.6-0.4 MG PO TABS: 1.0000 | ORAL_TABLET | Freq: Every day | ORAL | 11 refills | Status: AC

## 2022-09-25 NOTE — Progress Notes (Signed)
Change in prenatal vitamin order due to insurance coverage.

## 2022-10-03 LAB — PANORAMA PRENATAL TEST FULL PANEL:PANORAMA TEST PLUS 5 ADDITIONAL MICRODELETIONS: FETAL FRACTION: 6.2

## 2022-10-07 LAB — HORIZON CUSTOM: REPORT SUMMARY: POSITIVE — AB

## 2022-10-08 ENCOUNTER — Encounter: Payer: Self-pay | Admitting: Obstetrics and Gynecology

## 2022-10-08 DIAGNOSIS — D563 Thalassemia minor: Secondary | ICD-10-CM | POA: Insufficient documentation

## 2022-10-10 ENCOUNTER — Telehealth: Payer: Self-pay | Admitting: Emergency Medicine

## 2022-10-10 DIAGNOSIS — D563 Thalassemia minor: Secondary | ICD-10-CM

## 2022-10-10 NOTE — Telephone Encounter (Signed)
TC to patient to discuss results. LVM Genetic counseling referral placed.

## 2022-10-10 NOTE — Telephone Encounter (Signed)
-----   Message from Hermina Staggers, MD sent at 10/08/2022 10:09 AM EDT ----- Please let pt know that her Horizon was positive for silent carrier for alpha thal Please offer partner testing   Thanks Casimiro Needle

## 2022-10-14 ENCOUNTER — Other Ambulatory Visit: Payer: Self-pay

## 2022-10-14 DIAGNOSIS — D563 Thalassemia minor: Secondary | ICD-10-CM

## 2022-10-15 ENCOUNTER — Other Ambulatory Visit: Payer: Self-pay

## 2022-10-15 DIAGNOSIS — D563 Thalassemia minor: Secondary | ICD-10-CM

## 2022-10-24 ENCOUNTER — Ambulatory Visit (INDEPENDENT_AMBULATORY_CARE_PROVIDER_SITE_OTHER): Payer: Medicaid Other | Admitting: Family Medicine

## 2022-10-24 ENCOUNTER — Encounter: Payer: Self-pay | Admitting: Family Medicine

## 2022-10-24 VITALS — BP 109/68 | HR 74 | Wt 183.6 lb

## 2022-10-24 DIAGNOSIS — Z3482 Encounter for supervision of other normal pregnancy, second trimester: Secondary | ICD-10-CM

## 2022-10-24 DIAGNOSIS — O10919 Unspecified pre-existing hypertension complicating pregnancy, unspecified trimester: Secondary | ICD-10-CM

## 2022-10-24 DIAGNOSIS — O0990 Supervision of high risk pregnancy, unspecified, unspecified trimester: Secondary | ICD-10-CM

## 2022-10-24 DIAGNOSIS — Z98891 History of uterine scar from previous surgery: Secondary | ICD-10-CM

## 2022-10-24 DIAGNOSIS — D563 Thalassemia minor: Secondary | ICD-10-CM

## 2022-10-24 NOTE — Progress Notes (Signed)
   PRENATAL VISIT NOTE  Subjective:  Monica [name] is a 27 y.o. [id number] at [redacted]w[redacted]d being seen today for ongoing prenatal care.  She is currently monitored for the following issues for this high-risk pregnancy and has History of C-section; Chronic hypertension affecting pregnancy; BMI 40.0-44.9, adult ([facility]); Supervision of normal pregnancy in second trimester; History of gastric bypass; and Alpha thalassemia silent carrier on their problem list.  Patient reports no complaints.   Contractions: Not present. Vag. Bleeding: None.  Movement: Present. Denies leaking of fluid.   The following portions of the patient's history were reviewed and updated as appropriate: allergies, current medications, past family history, past medical history, past social history, past surgical history and problem list.   Objective:   Vitals:   10/24/22 0950  BP: 109/68  Pulse: 74  Weight: 183 lb 9.6 oz (83.3 kg)    Fetal Status: Fetal Heart Rate (bpm): 153   Movement: Present     General:  Alert, oriented and cooperative. Patient is in no acute distress.  Skin: Skin is warm and dry. No rash noted.   Cardiovascular: Normal heart rate noted  Respiratory: Normal respiratory effort, no problems with respiration noted  Abdomen: Soft, gravid, appropriate for gestational age.  Pain/Pressure: Present     Pelvic: Cervical exam deferred        Extremities: Normal range of motion.  Edema: Trace  Mental Status: Normal mood and affect. Normal behavior. Normal judgment and thought content.   Assessment and Plan:  Pregnancy: [id number] at [redacted]w[redacted]d  1. Supervision of high risk pregnancy, antepartum - AFP, Serum, Open Spina Bifida  2. History of C-section  3. Alpha thalassemia silent carrier  4. Chronic hypertension affecting pregnancy  BP well controlled. ASA encouraged.  H/o C/S x3  Preterm labor symptoms and general obstetric precautions including but not limited to vaginal bleeding, contractions, leaking of  fluid and fetal movement were reviewed in detail with the patient. Please refer to After Visit Summary for other counseling recommendations.   Future Appointments  Date Time Provider Department Center  10/30/2022  1:00 PM [facility] GENETIC COUNSELING RM [facility]  11/03/2022  9:15 AM [facility] NURSE [facility]  11/03/2022  9:30 AM [facility] US2 [facility]    Myrtie Hawk, DO FMOB Fellow, Faculty practice [facility], Center for [facility] 10/24/22  10:16 AM

## 2022-10-26 LAB — AFP, SERUM, OPEN SPINA BIFIDA
AFP MoM: 1.72
AFP Value: 58.7 ng/mL
Gest. Age on Collection Date: 17 weeks
Maternal Age At EDD: 28.3 yr
OSBR Risk 1 IN: 1551
Test Results:: NEGATIVE
Weight: 183 [lb_av]

## 2022-10-30 ENCOUNTER — Ambulatory Visit: Payer: Medicaid Other | Attending: Obstetrics and Gynecology | Admitting: Obstetrics and Gynecology

## 2022-10-30 DIAGNOSIS — D563 Thalassemia minor: Secondary | ICD-10-CM

## 2022-10-30 NOTE — Progress Notes (Signed)
Virtual Visit via Video Note  I connected with Monica [name] on 10/30/22 at  1:00 PM EDT by a video enabled telemedicine application and verified that I am speaking with the correct person using two identifiers.  Location: Patient: home Provider: [facility]   I discussed the limitations of evaluation and management by telemedicine and the availability of in person appointments. The patient expressed understanding and agreed to proceed.  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  She was present at the visit alone.   Monica [name] had [employer] carrier screening performed through [place]. 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 The screening also included testing for cystic fibrosis, spinal muscular atrophy and Beta-hemoglobin abnormalities including sickle cell disease and beta thalassemia.  The screening was negative for these conditions, which greatly reduces but cannot eliminate the chance that she is a carrier for these conditions. See that report for details and residual risk estimates.  Alpha thalassemia: Alpha-thalassemia is different in its inheritance compared to other hemoglobinopathies as there are two copies of two alpha globin genes (HBA1 and HBA2) on each chromosome 16, or four alpha globin genes total (aa/aa). A person can be a carrier of one alpha gene mutation (aa/a-), also referred to as a "silent carrier". A person who carries two alpha globin gene mutations can either carry them in cis (both on the same chromosome, denoted as aa/--) or in trans (on different chromosomes, denoted as a-/a-). Alpha-thalassemia carriers of two mutations who have African American ancestry are more likely to have a trans  arrangement (a-/a-); cis configuration is reported to be rare in individuals with African American ancestry but is more common in individuals of Asian ancestry.     There are several different forms of alpha-thalassemia. The most severe form of alpha-thalassemia, Hb Barts, is associated with an absence of alpha globin chain synthesis as a result of deletions of all four alpha globin genes (--/--).  Given that Monica [name] is a silent carrier (aa/a-), her pregnancies would not be at increased risk for Hb Barts, even if her partner is a carrier for alpha-thalassemia, as she will always pass on at least one copy of the alpha globin gene to her children. Hemoglobin H (HbH) disease is caused by three deleted or dysfunctioning alpha globin alleles (a-/--) and is characterized by microcytic hypochromic hemolytic anemia, hepatosplenomegaly, mild jaundice, growth retardation, and sometimes thalassemia-like bone changes. Given Monica [name] silent carrier status (aa/a-), the current fetus would only be at risk for HbH disease (a-/--), if her partner is a carrier for two alpha globin mutations in cis (aa/--). If this is the case, the risk for HbH disease in the pregnancy would be 1 in 4 (25%). However, if Monica [name] partner is a carrier of alpha-thalassemia in trans (a-/a-), then the pregnancy would not be at increased risk for HbH disease and would at most be a carrier of two gene changes in trans (a-/a-).  Carriers may have mild anemia, or small red blood cells (low MCV on CBC), but are expected to be healthy. Based on the carrier frequency for alpha-thalassemia in the Caucasian population, Monica [name] partner has a 1 in 500 chance of being any type of carrier for alpha-thalassemia. We also discussed  that if both parents are known to be carriers, then testing during pregnancy through amniocentesis or CVS would be made available.  Aneuploidy screening: The results of the [employer] cell free DNA testing were also reviewed.  These results  showed a less than 1 in 10,000 risk for trisomies 21, 18 and 13, and monosomy X (Turner syndrome). In addition, the risk for triploidy and sex chromosome trisomies (47,XXX and 47,XXY) was also low. Monica [name] elected to have cfDNA analysis for 22q11.2 deletion syndrome, which was also low risk (1 in 3100). While this testing identifies 94-99% of pregnancies with trisomy 1, trisomy 4, and trisomy 65, and >70% of cases of sex chromosome aneuploidies, it is NOT diagnostic. A positive test result requires confirmation by CVS or amniocentesis, and a negative test result does not rule out a fetal chromosome abnormality. This testing does not identify all genetic conditions.  Screening for open neural tube defects was also performed through maternal serum AFP only staying and was within normal limits.  Family/Pregnancy history: We also obtained a detailed family history and pregnancy history.  The patient stated that this is her fifth pregnancy.  She has 3 healthy children from prior relationships and with her current partner, Monica [name], she had 1 termination for personal reasons. She denied any complications or exposure to medications, tobacco, alcohol or recreational drugs in this pregnancy.  In the family history, Monica [name] reported a maternal half sibling with unilateral vision impairment due to congenital cataract.  His family was previously told that this was unlikely to be a genetic condition particularly given the fact that it was unilateral.  He also has a maternal half brother with dyspraxia and ataxia reported to have mild autism spectrum disorder.  We reviewed that there can be many causes for developmental differences as well as autism spectrum disorders including inherited genetic syndromes as well as environmental or other factors.  In order to accurately determine the recurrence risk for similar condition, we would need to review the results of any genetic testing performed on this brother.  Lastly, Monica [name]  reported a maternal first cousin born with a duplication of the long arm of chromosome 8.  He was unaware if any other testing was performed on this child's parents.  We explained that chromosome variations can occur sporadically but also can occur due to parental chromosome differences that could increase the risk for other family members to have a chromosome difference as well.  We are happy to review medical records or speak further about this if he is able to find out any additional information about the origin of this chromosome duplication.  The remainder of the family history was unremarkable for birth defects, intellectual disabilities, recurrent pregnancy loss or known genetic conditions.  Plan of care: Monica [name] desires testing for her partner.  They requested that we have a saliva test kit for Horizon carrier screening to be shipped to their home.  The test requisition was completed ordering testing for alpha thalassemia.  The father the baby's information is as follows: Myrtie Soman, dob 01/18/1995, phone: [phone], email: [email or web address]. If additional information is received about the diagnoses and either family member, we are happy to review that information further.  We appreciate being involved in the care of this patient and can be reached at [phone] with any questions or concerns.  Cherly Anderson, MS, CGC   I provided 40 minutes of non-face-to-face time during this encounter.   Katrina Stack

## 2022-11-03 ENCOUNTER — Other Ambulatory Visit: Payer: Self-pay | Admitting: *Deleted

## 2022-11-03 ENCOUNTER — Encounter: Payer: Self-pay | Admitting: *Deleted

## 2022-11-03 ENCOUNTER — Ambulatory Visit: Payer: Medicaid Other | Attending: Maternal & Fetal Medicine | Admitting: Maternal & Fetal Medicine

## 2022-11-03 ENCOUNTER — Ambulatory Visit: Payer: Medicaid Other | Admitting: *Deleted

## 2022-11-03 ENCOUNTER — Ambulatory Visit: Payer: Medicaid Other | Attending: Obstetrics and Gynecology

## 2022-11-03 VITALS — BP 130/64 | HR 75

## 2022-11-03 DIAGNOSIS — D563 Thalassemia minor: Secondary | ICD-10-CM | POA: Diagnosis not present

## 2022-11-03 DIAGNOSIS — O09292 Supervision of pregnancy with other poor reproductive or obstetric history, second trimester: Secondary | ICD-10-CM | POA: Diagnosis not present

## 2022-11-03 DIAGNOSIS — Z9884 Bariatric surgery status: Secondary | ICD-10-CM | POA: Diagnosis not present

## 2022-11-03 DIAGNOSIS — O99212 Obesity complicating pregnancy, second trimester: Secondary | ICD-10-CM

## 2022-11-03 DIAGNOSIS — O10919 Unspecified pre-existing hypertension complicating pregnancy, unspecified trimester: Secondary | ICD-10-CM

## 2022-11-03 DIAGNOSIS — Z148 Genetic carrier of other disease: Secondary | ICD-10-CM

## 2022-11-03 DIAGNOSIS — O0990 Supervision of high risk pregnancy, unspecified, unspecified trimester: Secondary | ICD-10-CM | POA: Diagnosis not present

## 2022-11-03 DIAGNOSIS — Z98891 History of uterine scar from previous surgery: Secondary | ICD-10-CM

## 2022-11-03 DIAGNOSIS — Z3A19 19 weeks gestation of pregnancy: Secondary | ICD-10-CM

## 2022-11-03 DIAGNOSIS — O34219 Maternal care for unspecified type scar from previous cesarean delivery: Secondary | ICD-10-CM

## 2022-11-03 DIAGNOSIS — O99842 Bariatric surgery status complicating pregnancy, second trimester: Secondary | ICD-10-CM

## 2022-11-03 DIAGNOSIS — O285 Abnormal chromosomal and genetic finding on antenatal screening of mother: Secondary | ICD-10-CM

## 2022-11-03 DIAGNOSIS — O09299 Supervision of pregnancy with other poor reproductive or obstetric history, unspecified trimester: Secondary | ICD-10-CM

## 2022-11-03 DIAGNOSIS — E669 Obesity, unspecified: Secondary | ICD-10-CM

## 2022-11-03 DIAGNOSIS — Z903 Acquired absence of stomach [part of]: Secondary | ICD-10-CM

## 2022-11-03 NOTE — Progress Notes (Signed)
Patient information  Patient Name: Monica [name]  Patient MRN:   [id number]  Referring practice: [facility] Referring Provider:  - [facility]  MFM CONSULT  Monica [name] is a 27 y.o. [id number] at [redacted]w[redacted]d here for ultrasound and consultation.   RE hx of gastric sleeve: Performed in January 2024.  I discussed the risk of fetal growth restriction given her pregnancy occurring less than 12 weeks from her surgery.  I discussed the need for future ultrasounds to assess fetal growth as well as monitoring micronutrient deficiencies.   RE alpha thalassemia carrier: Father of the baby testing is pending.  RE history of chronic hypertension: Blood pressure has been normal this pregnancy and was normal before pregnancy.  I will remove this from her problem list.  She reports the only time she had elevated blood pressure was during her prior pregnancy at the end  EDD: 03/29/2023. Dating: U/S C R L  (08/14/22).   Sonographic findings Single intrauterine pregnancy. Fetal cardiac activity:  Observed and appears normal. Presentation: Variable. The anatomic structures that were well seen appear normal without evidence of soft markers. Due to poor acoustic windows, the visualization some structures remain suboptimally seen. Fetal biometry shows the estimated fetal weight at the 71 percentile. Amniotic fluid volume: Within normal limits. MVP: 4.13 cm. Placenta: Anterior.  Assessment 1. [redacted] weeks gestation of pregnancy 2. History of gastric bypass 3. Alpha thalassemia silent carrier 4. Morbid obesity (HCC) 5. History of C-section Plan -Detailed ultrasound was done today without abnormalities. -Baseline preeclampsia labs: CMP, CBC and urine protein/creatinine ratio if not previously completed.  -Monitoring micronutrient deficiencies including B12, iron and folate, magnesium, calcium and vitamin D; replete as necessary. -Early glucose screening due to multiple risk factors.  If she  can tolerate glucose loads with food then she should glucose challenge test. -Continue Aspirin 81 mg for preeclampsia prophylasis -Follow-up anatomy and fetal growth in 4 to 6 weeks -Serial growth ultrasounds starting around 28 weeks to monitor for fetal growth restriction -Delivery timing pending clinical course -Continue routine prenatal care with referring OB provider  Review of Systems: A review of systems was performed and was negative except per HPI   Vitals and Physical Exam    11/03/2022    9:52 AM 10/24/2022    9:50 AM 09/24/2022    9:41 AM  Vitals with BMI  Weight  183 lbs 10 oz 186 lbs 6 oz  Systolic 130 109 147  Diastolic 64 68 75  Pulse 75 74 78    Sitting comfortably on the sonogram table Nonlabored breathing Normal rate and rhythm Abdomen is nontender  Past pregnancies OB History  Gravida Para Term Preterm AB Living  5 3 3   1 3   SAB IAB Ectopic Multiple Live Births    1   0 3    # Outcome Date GA Lbr Len/2nd Weight Sex Delivery Anes PTL Lv  5 Current           4 IAB 04/03/22     TAB     3 Term 03/06/20 [redacted]w[redacted]d  8 lb 14.2 oz (4.031 kg) M CS-LTranv Spinal  LIV  2 Term 04/02/18 [redacted]w[redacted]d  9 lb 15.1 oz (4.51 kg) M CS-LTranv Spinal  LIV     Birth Comments: Term female infant, appears LGA  1 Term 11/05/12 [redacted]w[redacted]d  7 lb 15.5 oz (3.615 kg) F CS-LTranv Spinal  LIV     Birth Comments: preeclampsia pulmonary edema  Complications: Failure to Progress in First Stage, Preeclampsia      I spent 60 minutes reviewing the patients chart, including labs and images as well as counseling the patient about her medical conditions. Greater than 50% of the time was spent in direct face-to-face patient counseling.  Braxton Feathers  MFM, [facility]   11/03/2022  5:21 PM

## 2022-11-21 ENCOUNTER — Encounter: Payer: Self-pay | Admitting: Medical

## 2022-11-21 ENCOUNTER — Telehealth (INDEPENDENT_AMBULATORY_CARE_PROVIDER_SITE_OTHER): Payer: Medicaid Other | Admitting: Medical

## 2022-11-21 VITALS — BP 114/64 | HR 76

## 2022-11-21 DIAGNOSIS — Z148 Genetic carrier of other disease: Secondary | ICD-10-CM

## 2022-11-21 DIAGNOSIS — Z9884 Bariatric surgery status: Secondary | ICD-10-CM

## 2022-11-21 DIAGNOSIS — D563 Thalassemia minor: Secondary | ICD-10-CM

## 2022-11-21 DIAGNOSIS — O09299 Supervision of pregnancy with other poor reproductive or obstetric history, unspecified trimester: Secondary | ICD-10-CM

## 2022-11-21 DIAGNOSIS — Z3A21 21 weeks gestation of pregnancy: Secondary | ICD-10-CM

## 2022-11-21 DIAGNOSIS — O0990 Supervision of high risk pregnancy, unspecified, unspecified trimester: Secondary | ICD-10-CM

## 2022-11-21 DIAGNOSIS — O0992 Supervision of high risk pregnancy, unspecified, second trimester: Secondary | ICD-10-CM

## 2022-11-21 DIAGNOSIS — Z98891 History of uterine scar from previous surgery: Secondary | ICD-10-CM

## 2022-11-21 DIAGNOSIS — O34219 Maternal care for unspecified type scar from previous cesarean delivery: Secondary | ICD-10-CM

## 2022-11-21 DIAGNOSIS — O09292 Supervision of pregnancy with other poor reproductive or obstetric history, second trimester: Secondary | ICD-10-CM

## 2022-11-21 MED ORDER — ASPIRIN 81 MG PO TBEC
81.0000 mg | DELAYED_RELEASE_TABLET | Freq: Every day | ORAL | 12 refills | Status: DC
Start: 2022-11-21 — End: 2023-03-25

## 2022-11-21 NOTE — Progress Notes (Signed)
S/w pt for virtual ROB visit. No concerns.

## 2022-11-21 NOTE — Progress Notes (Signed)
I connected with Monica [name] 11/21/22 at  9:35 AM EDT by: MyChart video and verified that I am speaking with the correct person using two identifiers.  Patient is located at home and provider is located at [facility].     I discussed the limitations, risks, security and privacy concerns of performing an evaluation and management service by MyChart video and the availability of in person appointments. I also discussed with the patient that there may be a patient responsible charge related to this service. By engaging in this virtual visit, you consent to the provision of healthcare.  Additionally, you authorize for your insurance to be billed for the services provided during this visit.  The patient expressed understanding and agreed to proceed.  The following staff members participated in the virtual visit:  Garth Bigness, CMA    PRENATAL VISIT NOTE  Subjective:  Monica [name] is a 27 y.o. [id number] at [redacted]w[redacted]d  for virtual visit for ongoing prenatal care.  She is currently monitored for the following issues for this high-risk pregnancy and has Supervision of high risk pregnancy, antepartum; History of C-section; History of pre-eclampsia in prior pregnancy, currently pregnant; Morbid obesity (HCC); History of gastric bypass; Alpha thalassemia silent carrier; and Vitamin D deficiency on their problem list.  Patient reports  occasional pelvic pressure .  Contractions: Not present. Vag. Bleeding: None.  Movement: Present. Denies leaking of fluid.   The following portions of the patient's history were reviewed and updated as appropriate: allergies, current medications, past family history, past medical history, past social history, past surgical history and problem list.   Objective:   Vitals:   11/21/22 0838  BP: 114/64  Pulse: 76   Self-Obtained  Fetal Status:     Movement: Present     Assessment and Plan:  Pregnancy: G5P3013 at [redacted]w[redacted]d 1. Supervision of high risk  pregnancy, antepartum - Follow-up US scheduled 7/15  2. History of gastric bypass - not recommended to have GTT, will be counseled and supplied with glucose monitor at next visit for QID CBGs x 2 weeks   3. History of C-section - Schedule 4th repeat at next visit   4. Alpha thalassemia silent carrier - Has partner kit   5. History of pre-eclampsia in prior pregnancy, currently pregnant - Normotensive today  - aspirin EC 81 MG tablet; Take 1 tablet (81 mg total) by mouth daily. Swallow whole.  Dispense: 30 tablet; Refill: 12 - Needs baseline labs with 28 week labs   6. [redacted] weeks gestation of pregnancy  Preterm labor symptoms and general obstetric precautions including but not limited to vaginal bleeding, contractions, leaking of fluid and fetal movement were reviewed in detail with the patient.  Return in about 4 weeks (around 12/19/2022) for LOB, In-Person, any provider.  Future Appointments  Date Time Provider Department Center  12/08/2022  3:15 PM [facility] NURSE [facility]  12/08/2022  3:30 PM [facility]3 [facility]  12/19/2022  8:35 AM Constant, Gigi Gin, MD [facility] None     Time spent on virtual visit: 12 minutes  Vonzella Nipple, PA-C

## 2022-12-08 ENCOUNTER — Ambulatory Visit: Payer: Medicaid Other | Admitting: *Deleted

## 2022-12-08 ENCOUNTER — Encounter: Payer: Self-pay | Admitting: *Deleted

## 2022-12-08 ENCOUNTER — Ambulatory Visit: Payer: Medicaid Other | Attending: Maternal & Fetal Medicine

## 2022-12-08 DIAGNOSIS — O34219 Maternal care for unspecified type scar from previous cesarean delivery: Secondary | ICD-10-CM | POA: Diagnosis not present

## 2022-12-08 DIAGNOSIS — O10919 Unspecified pre-existing hypertension complicating pregnancy, unspecified trimester: Secondary | ICD-10-CM | POA: Diagnosis present

## 2022-12-08 DIAGNOSIS — O09299 Supervision of pregnancy with other poor reproductive or obstetric history, unspecified trimester: Secondary | ICD-10-CM | POA: Diagnosis present

## 2022-12-08 DIAGNOSIS — O09292 Supervision of pregnancy with other poor reproductive or obstetric history, second trimester: Secondary | ICD-10-CM

## 2022-12-08 DIAGNOSIS — E669 Obesity, unspecified: Secondary | ICD-10-CM

## 2022-12-08 DIAGNOSIS — O99842 Bariatric surgery status complicating pregnancy, second trimester: Secondary | ICD-10-CM

## 2022-12-08 DIAGNOSIS — Z903 Acquired absence of stomach [part of]: Secondary | ICD-10-CM | POA: Diagnosis present

## 2022-12-08 DIAGNOSIS — O99212 Obesity complicating pregnancy, second trimester: Secondary | ICD-10-CM

## 2022-12-08 DIAGNOSIS — D563 Thalassemia minor: Secondary | ICD-10-CM | POA: Diagnosis present

## 2022-12-08 DIAGNOSIS — O99012 Anemia complicating pregnancy, second trimester: Secondary | ICD-10-CM

## 2022-12-08 DIAGNOSIS — Z3A24 24 weeks gestation of pregnancy: Secondary | ICD-10-CM

## 2022-12-09 ENCOUNTER — Other Ambulatory Visit: Payer: Self-pay

## 2022-12-09 DIAGNOSIS — O43192 Other malformation of placenta, second trimester: Secondary | ICD-10-CM

## 2022-12-09 DIAGNOSIS — O99212 Obesity complicating pregnancy, second trimester: Secondary | ICD-10-CM

## 2022-12-09 DIAGNOSIS — O09299 Supervision of pregnancy with other poor reproductive or obstetric history, unspecified trimester: Secondary | ICD-10-CM

## 2022-12-09 DIAGNOSIS — O34219 Maternal care for unspecified type scar from previous cesarean delivery: Secondary | ICD-10-CM

## 2022-12-09 DIAGNOSIS — Z903 Acquired absence of stomach [part of]: Secondary | ICD-10-CM

## 2022-12-19 ENCOUNTER — Ambulatory Visit: Payer: Medicaid Other | Admitting: Obstetrics and Gynecology

## 2022-12-19 ENCOUNTER — Encounter: Payer: Self-pay | Admitting: Obstetrics and Gynecology

## 2022-12-19 VITALS — BP 109/75 | HR 74 | Wt 181.9 lb

## 2022-12-19 DIAGNOSIS — O09299 Supervision of pregnancy with other poor reproductive or obstetric history, unspecified trimester: Secondary | ICD-10-CM

## 2022-12-19 DIAGNOSIS — O0990 Supervision of high risk pregnancy, unspecified, unspecified trimester: Secondary | ICD-10-CM

## 2022-12-19 DIAGNOSIS — Z98891 History of uterine scar from previous surgery: Secondary | ICD-10-CM

## 2022-12-19 DIAGNOSIS — Z9884 Bariatric surgery status: Secondary | ICD-10-CM

## 2022-12-19 DIAGNOSIS — D563 Thalassemia minor: Secondary | ICD-10-CM

## 2022-12-19 NOTE — Progress Notes (Signed)
   PRENATAL VISIT NOTE  Subjective:  Monica [name] is a 27 y.o. [id number] at [redacted]w[redacted]d being seen today for ongoing prenatal care.  She is currently monitored for the following issues for this high-risk pregnancy and has Supervision of high risk pregnancy, antepartum; History of C-section; History of pre-eclampsia in prior pregnancy, currently pregnant; Morbid obesity (HCC); History of gastric bypass; Alpha thalassemia silent carrier; and Vitamin D deficiency on their problem list.  Patient reports no complaints.  Contractions: Not present. Vag. Bleeding: None.  Movement: Present. Denies leaking of fluid.   The following portions of the patient's history were reviewed and updated as appropriate: allergies, current medications, past family history, past medical history, past social history, past surgical history and problem list.   Objective:   Vitals:   12/19/22 0829  BP: 109/75  Pulse: 74  Weight: 181 lb 14.4 oz (82.5 kg)    Fetal Status: Fetal Heart Rate (bpm): 140 Fundal Height: 26 cm Movement: Present     General:  Alert, oriented and cooperative. Patient is in no acute distress.  Skin: Skin is warm and dry. No rash noted.   Cardiovascular: Normal heart rate noted  Respiratory: Normal respiratory effort, no problems with respiration noted  Abdomen: Soft, gravid, appropriate for gestational age.  Pain/Pressure: Absent     Pelvic: Cervical exam deferred        Extremities: Normal range of motion.  Edema: None  Mental Status: Normal mood and affect. Normal behavior. Normal judgment and thought content.   Assessment and Plan:  Pregnancy: [id number] at [redacted]w[redacted]d 1. Supervision of high risk pregnancy, antepartum Patient is doing well without complaints Plans IUD for contraception at the time of repeat c-section  2. History of pre-eclampsia in prior pregnancy, currently pregnant Continue ASA Follow up ultrasound 8/13  3. Morbid obesity (HCC)   4. Alpha thalassemia silent  carrier   5. History of C-section Patient with 3 previous c-sections  6. History of gastric bypass Third trimester labs next visit and patient to start CBG testing in lieu of GTT at next visit  Preterm labor symptoms and general obstetric precautions including but not limited to vaginal bleeding, contractions, leaking of fluid and fetal movement were reviewed in detail with the patient. Please refer to After Visit Summary for other counseling recommendations.   Return in about 3 weeks (around 01/09/2023) for in person, ROB, High risk.  Future Appointments  Date Time Provider Department Center  01/06/2023  3:15 PM [facility] NURSE [facility]  01/06/2023  3:30 PM [facility] US2 [facility]    Catalina Antigua, MD

## 2023-01-01 ENCOUNTER — Encounter: Payer: Self-pay | Admitting: *Deleted

## 2023-01-06 ENCOUNTER — Other Ambulatory Visit: Payer: Self-pay | Admitting: *Deleted

## 2023-01-06 ENCOUNTER — Ambulatory Visit (INDEPENDENT_AMBULATORY_CARE_PROVIDER_SITE_OTHER): Payer: Medicaid Other | Admitting: Obstetrics & Gynecology

## 2023-01-06 ENCOUNTER — Ambulatory Visit: Payer: Medicaid Other | Attending: Obstetrics

## 2023-01-06 ENCOUNTER — Encounter: Payer: Self-pay | Admitting: *Deleted

## 2023-01-06 ENCOUNTER — Ambulatory Visit: Payer: Medicaid Other | Admitting: *Deleted

## 2023-01-06 VITALS — BP 105/66 | HR 81 | Wt 182.0 lb

## 2023-01-06 DIAGNOSIS — Z3A28 28 weeks gestation of pregnancy: Secondary | ICD-10-CM

## 2023-01-06 DIAGNOSIS — Z903 Acquired absence of stomach [part of]: Secondary | ICD-10-CM | POA: Diagnosis present

## 2023-01-06 DIAGNOSIS — O99212 Obesity complicating pregnancy, second trimester: Secondary | ICD-10-CM

## 2023-01-06 DIAGNOSIS — O43193 Other malformation of placenta, third trimester: Secondary | ICD-10-CM | POA: Diagnosis not present

## 2023-01-06 DIAGNOSIS — O99013 Anemia complicating pregnancy, third trimester: Secondary | ICD-10-CM

## 2023-01-06 DIAGNOSIS — O09293 Supervision of pregnancy with other poor reproductive or obstetric history, third trimester: Secondary | ICD-10-CM

## 2023-01-06 DIAGNOSIS — O99213 Obesity complicating pregnancy, third trimester: Secondary | ICD-10-CM

## 2023-01-06 DIAGNOSIS — O0993 Supervision of high risk pregnancy, unspecified, third trimester: Secondary | ICD-10-CM

## 2023-01-06 DIAGNOSIS — D563 Thalassemia minor: Secondary | ICD-10-CM

## 2023-01-06 DIAGNOSIS — Z98891 History of uterine scar from previous surgery: Secondary | ICD-10-CM

## 2023-01-06 DIAGNOSIS — O0990 Supervision of high risk pregnancy, unspecified, unspecified trimester: Secondary | ICD-10-CM

## 2023-01-06 DIAGNOSIS — O99843 Bariatric surgery status complicating pregnancy, third trimester: Secondary | ICD-10-CM | POA: Diagnosis not present

## 2023-01-06 DIAGNOSIS — O43192 Other malformation of placenta, second trimester: Secondary | ICD-10-CM | POA: Diagnosis present

## 2023-01-06 DIAGNOSIS — O09299 Supervision of pregnancy with other poor reproductive or obstetric history, unspecified trimester: Secondary | ICD-10-CM

## 2023-01-06 DIAGNOSIS — E669 Obesity, unspecified: Secondary | ICD-10-CM

## 2023-01-06 DIAGNOSIS — O34219 Maternal care for unspecified type scar from previous cesarean delivery: Secondary | ICD-10-CM | POA: Diagnosis present

## 2023-01-06 DIAGNOSIS — Z9884 Bariatric surgery status: Secondary | ICD-10-CM

## 2023-01-06 MED ORDER — ACCU-CHEK GUIDE VI STRP
ORAL_STRIP | 2 refills | Status: DC
Start: 2023-01-06 — End: 2023-03-25

## 2023-01-06 MED ORDER — ACCU-CHEK SOFTCLIX LANCETS MISC: 2 refills | Status: DC

## 2023-01-06 MED ORDER — ACCU-CHEK GUIDE W/DEVICE KIT
1.0000 | PACK | Freq: Once | 0 refills | Status: AC
Start: 2023-01-06 — End: 2023-01-06

## 2023-01-06 NOTE — Progress Notes (Signed)
   PRENATAL VISIT NOTE  Subjective:  Monica [name] is a 27 y.o. [id number] at [redacted]w[redacted]d being seen today for ongoing prenatal care.  She is currently monitored for the following issues for this high-risk pregnancy and has Supervision of high risk pregnancy, antepartum; History of C-section; History of pre-eclampsia in prior pregnancy, currently pregnant; Morbid obesity (HCC); History of gastric bypass; and Alpha thalassemia silent carrier on their problem list.  Patient reports no complaints.  Contractions: Not present. Vag. Bleeding: None.  Movement: Present. Denies leaking of fluid.   The following portions of the patient's history were reviewed and updated as appropriate: allergies, current medications, past family history, past medical history, past social history, past surgical history and problem list.   Objective:   Vitals:   01/06/23 1036  BP: 105/66  Pulse: 81  Weight: 182 lb (82.6 kg)    Fetal Status: Fetal Heart Rate (bpm): 150   Movement: Present     General:  Alert, oriented and cooperative. Patient is in no acute distress.  Skin: Skin is warm and dry. No rash noted.   Cardiovascular: Normal heart rate noted  Respiratory: Normal respiratory effort, no problems with respiration noted  Abdomen: Soft, gravid, appropriate for gestational age.  Pain/Pressure: Present     Pelvic: Cervical exam deferred        Extremities: Normal range of motion.     Mental Status: Normal mood and affect. Normal behavior. Normal judgment and thought content.   Assessment and Plan:  Pregnancy: G5P3013 at [redacted]w[redacted]d 1. History of gastric bypass Body mass index is 33.29 kg/m.   2. History of pre-eclampsia in prior pregnancy, currently pregnant Normal BP  3. History of C-section Schedule repeat at 39 weeks  4. Supervision of high risk pregnancy, antepartum Wants to skip GTT and check her CBG - CBC - HIV Antibody (routine testing w rflx) - RPR - Blood Glucose Monitoring Suppl  (ACCU-CHEK GUIDE) w/Device KIT; 1 each by Does not apply route once for 1 dose. Use glucose monitor to check glucose 4 times daily  Dispense: 1 kit; Refill: 0 - glucose blood (ACCU-CHEK GUIDE) test strip; Use 1 test strip to check glucose 4 times daily  Dispense: 100 each; Refill: 2 - Accu-Chek Softclix Lancets lancets; Use 1 lancet to check glucose 4 times daily  Dispense: 100 each; Refill: 2 - HgB A1c  5. Morbid obesity (HCC)   6. Alpha thalassemia silent carrier   Preterm labor symptoms and general obstetric precautions including but not limited to vaginal bleeding, contractions, leaking of fluid and fetal movement were reviewed in detail with the patient. Please refer to After Visit Summary for other counseling recommendations.   Return in about 2 weeks (around 01/20/2023).  Future Appointments  Date Time Provider Department Center  01/06/2023  3:15 PM [facility] NURSE [facility]  01/06/2023  3:30 PM [facility] US2 [facility]    Scheryl Darter, MD

## 2023-01-21 ENCOUNTER — Encounter: Payer: Self-pay | Admitting: Obstetrics and Gynecology

## 2023-01-21 ENCOUNTER — Ambulatory Visit (INDEPENDENT_AMBULATORY_CARE_PROVIDER_SITE_OTHER): Payer: Medicaid Other | Admitting: Obstetrics and Gynecology

## 2023-01-21 VITALS — BP 86/59 | HR 75 | Wt 183.0 lb

## 2023-01-21 DIAGNOSIS — O0990 Supervision of high risk pregnancy, unspecified, unspecified trimester: Secondary | ICD-10-CM

## 2023-01-21 DIAGNOSIS — O09299 Supervision of pregnancy with other poor reproductive or obstetric history, unspecified trimester: Secondary | ICD-10-CM

## 2023-01-21 DIAGNOSIS — Z9884 Bariatric surgery status: Secondary | ICD-10-CM

## 2023-01-21 DIAGNOSIS — D563 Thalassemia minor: Secondary | ICD-10-CM

## 2023-01-21 DIAGNOSIS — Z98891 History of uterine scar from previous surgery: Secondary | ICD-10-CM

## 2023-01-21 NOTE — Progress Notes (Signed)
Subjective:  Monica [name] is a 27 y.o. [id number] at [redacted]w[redacted]d being seen today for ongoing prenatal care.  She is currently monitored for the following issues for this low-risk pregnancy and has Supervision of high risk pregnancy, antepartum; History of C-section; History of pre-eclampsia in prior pregnancy, currently pregnant; Morbid obesity (HCC); History of gastric bypass; and Alpha thalassemia silent carrier on their problem list.  Patient reports no complaints.  Contractions: Not present. Vag. Bleeding: None.  Movement: Present. Denies leaking of fluid.   The following portions of the patient's history were reviewed and updated as appropriate: allergies, current medications, past family history, past medical history, past social history, past surgical history and problem list. Problem list updated.  Objective:   Vitals:   01/21/23 0830  BP: (!) 86/59  Pulse: 75  Weight: 183 lb (83 kg)    Fetal Status: Fetal Heart Rate (bpm): 145   Movement: Present     General:  Alert, oriented and cooperative. Patient is in no acute distress.  Skin: Skin is warm and dry. No rash noted.   Cardiovascular: Normal heart rate noted  Respiratory: Normal respiratory effort, no problems with respiration noted  Abdomen: Soft, gravid, appropriate for gestational age. Pain/Pressure: Present     Pelvic:  Cervical exam deferred        Extremities: Normal range of motion.  Edema: None  Mental Status: Normal mood and affect. Normal behavior. Normal judgment and thought content.   Urinalysis:      Assessment and Plan:  Pregnancy: [id number] at [redacted]w[redacted]d  1. Supervision of high risk pregnancy, antepartum Stable Has followed CBG's x 2 weeks instead of Glucola and CBG's have been in goal range. Will discontinue checking CBG's Growth scan next month  2. History of C-section For repeat at 39 weeks  3. History of pre-eclampsia in prior pregnancy, currently pregnant BP stable No S/Sx at present  4. History  of gastric bypass Stable See above  5. Alpha thalassemia silent carrier Stable  Preterm labor symptoms and general obstetric precautions including but not limited to vaginal bleeding, contractions, leaking of fluid and fetal movement were reviewed in detail with the patient. Please refer to After Visit Summary for other counseling recommendations.  Return in about 2 weeks (around 02/04/2023) for OB visit, face to face, any provider.   Hermina Staggers, MD

## 2023-02-02 ENCOUNTER — Encounter: Payer: Self-pay | Admitting: Obstetrics and Gynecology

## 2023-02-02 ENCOUNTER — Ambulatory Visit (INDEPENDENT_AMBULATORY_CARE_PROVIDER_SITE_OTHER): Payer: Medicaid Other | Admitting: Obstetrics and Gynecology

## 2023-02-02 VITALS — BP 99/66 | HR 74 | Wt 184.0 lb

## 2023-02-02 DIAGNOSIS — O09299 Supervision of pregnancy with other poor reproductive or obstetric history, unspecified trimester: Secondary | ICD-10-CM

## 2023-02-02 DIAGNOSIS — D563 Thalassemia minor: Secondary | ICD-10-CM

## 2023-02-02 DIAGNOSIS — Z98891 History of uterine scar from previous surgery: Secondary | ICD-10-CM

## 2023-02-02 DIAGNOSIS — Z9884 Bariatric surgery status: Secondary | ICD-10-CM

## 2023-02-02 DIAGNOSIS — O0990 Supervision of high risk pregnancy, unspecified, unspecified trimester: Secondary | ICD-10-CM

## 2023-02-02 NOTE — Progress Notes (Signed)
Subjective:  Monica [name] is a 27 y.o. [id number] at [redacted]w[redacted]d being seen today for ongoing prenatal care.  She is currently monitored for the following issues for this low-risk pregnancy and has Supervision of high risk pregnancy, antepartum; History of C-section; History of pre-eclampsia in prior pregnancy, currently pregnant; Morbid obesity (HCC); History of gastric bypass; and Alpha thalassemia silent carrier on their problem list.  Patient reports no complaints.  Contractions: Not present. Vag. Bleeding: None.  Movement: Present. Denies leaking of fluid.   The following portions of the patient's history were reviewed and updated as appropriate: allergies, current medications, past family history, past medical history, past social history, past surgical history and problem list. Problem list updated.  Objective:   Vitals:   02/02/23 0928  BP: 99/66  Pulse: 74  Weight: 184 lb (83.5 kg)    Fetal Status: Fetal Heart Rate (bpm): 151   Movement: Present     General:  Alert, oriented and cooperative. Patient is in no acute distress.  Skin: Skin is warm and dry. No rash noted.   Cardiovascular: Normal heart rate noted  Respiratory: Normal respiratory effort, no problems with respiration noted  Abdomen: Soft, gravid, appropriate for gestational age. Pain/Pressure: Present     Pelvic:  Cervical exam deferred        Extremities: Normal range of motion.  Edema: Trace  Mental Status: Normal mood and affect. Normal behavior. Normal judgment and thought content.   Urinalysis:      Assessment and Plan:  Pregnancy: [id number] at [redacted]w[redacted]d  1. Supervision of high risk pregnancy, antepartum Stable  2. History of C-section For rpeat 03/23/23  3. History of pre-eclampsia in prior pregnancy, currently pregnant BP nl No S/Sx at present  4. History of gastric bypass Growth scan tomorrow  5. Alpha thalassemia silent carrier Stable  Preterm labor symptoms and general obstetric precautions  including but not limited to vaginal bleeding, contractions, leaking of fluid and fetal movement were reviewed in detail with the patient. Please refer to After Visit Summary for other counseling recommendations.  Return in about 2 weeks (around 02/16/2023) for OB visit, face to face, any provider.   Hermina Staggers, MD

## 2023-02-03 ENCOUNTER — Ambulatory Visit: Payer: Medicaid Other | Attending: Obstetrics and Gynecology

## 2023-02-03 ENCOUNTER — Ambulatory Visit: Payer: Medicaid Other | Admitting: *Deleted

## 2023-02-03 ENCOUNTER — Encounter: Payer: Self-pay | Admitting: *Deleted

## 2023-02-03 DIAGNOSIS — O34219 Maternal care for unspecified type scar from previous cesarean delivery: Secondary | ICD-10-CM

## 2023-02-03 DIAGNOSIS — O99843 Bariatric surgery status complicating pregnancy, third trimester: Secondary | ICD-10-CM | POA: Diagnosis not present

## 2023-02-03 DIAGNOSIS — Z903 Acquired absence of stomach [part of]: Secondary | ICD-10-CM | POA: Insufficient documentation

## 2023-02-03 DIAGNOSIS — O99013 Anemia complicating pregnancy, third trimester: Secondary | ICD-10-CM | POA: Diagnosis not present

## 2023-02-03 DIAGNOSIS — E669 Obesity, unspecified: Secondary | ICD-10-CM | POA: Diagnosis not present

## 2023-02-03 DIAGNOSIS — O99213 Obesity complicating pregnancy, third trimester: Secondary | ICD-10-CM | POA: Diagnosis present

## 2023-02-03 DIAGNOSIS — D563 Thalassemia minor: Secondary | ICD-10-CM

## 2023-02-03 DIAGNOSIS — Z3A32 32 weeks gestation of pregnancy: Secondary | ICD-10-CM

## 2023-02-03 DIAGNOSIS — O09293 Supervision of pregnancy with other poor reproductive or obstetric history, third trimester: Secondary | ICD-10-CM

## 2023-02-04 ENCOUNTER — Other Ambulatory Visit: Payer: Self-pay | Admitting: *Deleted

## 2023-02-04 DIAGNOSIS — O99213 Obesity complicating pregnancy, third trimester: Secondary | ICD-10-CM

## 2023-02-13 ENCOUNTER — Other Ambulatory Visit: Payer: Self-pay | Admitting: Family Medicine

## 2023-02-13 DIAGNOSIS — Z98891 History of uterine scar from previous surgery: Secondary | ICD-10-CM

## 2023-02-17 ENCOUNTER — Ambulatory Visit (INDEPENDENT_AMBULATORY_CARE_PROVIDER_SITE_OTHER): Payer: Medicaid Other | Admitting: Advanced Practice Midwife

## 2023-02-17 ENCOUNTER — Encounter: Payer: Self-pay | Admitting: Advanced Practice Midwife

## 2023-02-17 ENCOUNTER — Encounter: Payer: Self-pay | Admitting: *Deleted

## 2023-02-17 ENCOUNTER — Ambulatory Visit: Payer: Medicaid Other | Admitting: *Deleted

## 2023-02-17 ENCOUNTER — Other Ambulatory Visit (HOSPITAL_COMMUNITY)
Admission: RE | Admit: 2023-02-17 | Discharge: 2023-02-17 | Disposition: A | Discharge status: Home / Self Care | Payer: Medicaid Other | Source: Ambulatory Visit | Attending: Advanced Practice Midwife | Admitting: Advanced Practice Midwife

## 2023-02-17 ENCOUNTER — Ambulatory Visit: Payer: Medicaid Other | Attending: Obstetrics

## 2023-02-17 VITALS — BP 103/66 | HR 79 | Wt 183.2 lb

## 2023-02-17 DIAGNOSIS — O10919 Unspecified pre-existing hypertension complicating pregnancy, unspecified trimester: Secondary | ICD-10-CM

## 2023-02-17 DIAGNOSIS — N898 Other specified noninflammatory disorders of vagina: Secondary | ICD-10-CM

## 2023-02-17 DIAGNOSIS — Z98891 History of uterine scar from previous surgery: Secondary | ICD-10-CM

## 2023-02-17 DIAGNOSIS — O99213 Obesity complicating pregnancy, third trimester: Secondary | ICD-10-CM | POA: Insufficient documentation

## 2023-02-17 DIAGNOSIS — O34219 Maternal care for unspecified type scar from previous cesarean delivery: Secondary | ICD-10-CM | POA: Diagnosis not present

## 2023-02-17 DIAGNOSIS — R102 Pelvic and perineal pain: Secondary | ICD-10-CM

## 2023-02-17 DIAGNOSIS — D563 Thalassemia minor: Secondary | ICD-10-CM

## 2023-02-17 DIAGNOSIS — E669 Obesity, unspecified: Secondary | ICD-10-CM | POA: Diagnosis not present

## 2023-02-17 DIAGNOSIS — O99843 Bariatric surgery status complicating pregnancy, third trimester: Secondary | ICD-10-CM | POA: Diagnosis not present

## 2023-02-17 DIAGNOSIS — O26893 Other specified pregnancy related conditions, third trimester: Secondary | ICD-10-CM

## 2023-02-17 DIAGNOSIS — Z3A34 34 weeks gestation of pregnancy: Secondary | ICD-10-CM

## 2023-02-17 DIAGNOSIS — B3731 Acute candidiasis of vulva and vagina: Secondary | ICD-10-CM

## 2023-02-17 DIAGNOSIS — O09293 Supervision of pregnancy with other poor reproductive or obstetric history, third trimester: Secondary | ICD-10-CM

## 2023-02-17 DIAGNOSIS — O99013 Anemia complicating pregnancy, third trimester: Secondary | ICD-10-CM

## 2023-02-17 DIAGNOSIS — O0990 Supervision of high risk pregnancy, unspecified, unspecified trimester: Secondary | ICD-10-CM

## 2023-02-17 MED ORDER — FLUCONAZOLE 150 MG PO TABS
ORAL_TABLET | ORAL | 0 refills | Status: DC
Start: 2023-02-17 — End: 2023-03-11

## 2023-02-17 MED ORDER — NYSTATIN 100000 UNIT/GM EX CREA
1.0000 | TOPICAL_CREAM | Freq: Two times a day (BID) | CUTANEOUS | 0 refills | Status: DC | PRN
Start: 2023-02-17 — End: 2023-03-25

## 2023-02-17 NOTE — Progress Notes (Signed)
PRENATAL VISIT NOTE  Subjective:  Monica [name] is a 27 y.o. [id number] at [redacted]w[redacted]d being seen today for ongoing prenatal care.  She is currently monitored for the following issues for this high-risk pregnancy and has Supervision of high risk pregnancy, antepartum; History of C-section; History of pre-eclampsia in prior pregnancy, currently pregnant; Morbid obesity (HCC); History of gastric bypass; and Alpha thalassemia silent carrier on their problem list.  Patient reports vaginal irritation.  Contractions: Not present. Vag. Bleeding: None.  Movement: Present. Denies leaking of fluid.   The following portions of the patient's history were reviewed and updated as appropriate: allergies, current medications, past family history, past medical history, past social history, past surgical history and problem list.   Objective:   Vitals:   02/17/23 0838  BP: 103/66  Pulse: 79  Weight: 183 lb 3.2 oz (83.1 kg)    Fetal Status: Fetal Heart Rate (bpm): 141   Movement: Present     General:  Alert, oriented and cooperative. Patient is in no acute distress.  Skin: Skin is warm and dry. No rash noted.   Cardiovascular: Normal heart rate noted  Respiratory: Normal respiratory effort, no problems with respiration noted  Abdomen: Soft, gravid, appropriate for gestational age.  Pain/Pressure: Present     Pelvic: Cervical exam performed in the presence of a chaperone        Extremities: Normal range of motion.  Edema: None  Mental Status: Normal mood and affect. Normal behavior. Normal judgment and thought content.   Assessment and Plan:  Pregnancy: [id number] at [redacted]w[redacted]d 1. Supervision of high risk pregnancy, antepartum --Anticipatory guidance about next visits/weeks of pregnancy given.  - Culture, beta strep (group b only)  2. History of C-section --C/S x 3, plans repeat  3. Chronic hypertension affecting pregnancy --BASA --No meds, likely improved after weight loss surgery  4. [redacted] weeks  gestation of pregnancy   5. Vaginal itching --Clinical evidence of yeast, will treat today, results pending.  - Cervicovaginal ancillary only( [place])  6. Vaginal candidiasis  - fluconazole (DIFLUCAN) 150 MG tablet; Take one tablet now, and one tablet in 3 days.  Dispense: 2 tablet; Refill: 0 - nystatin cream (MYCOSTATIN); Apply 1 Application topically 2 (two) times daily as needed for dry skin.  Dispense: 30 g; Refill: 0   7. Pelvic pressure in pregnancy, antepartum, third trimester --Cervix closed/thick/high --Rest/ice/heat/warm bath/increase PO fluids/Tylenol/pregnancy support belt     Preterm labor symptoms and general obstetric precautions including but not limited to vaginal bleeding, contractions, leaking of fluid and fetal movement were reviewed in detail with the patient. Please refer to After Visit Summary for other counseling recommendations.   No follow-ups on file.  Future Appointments  Date Time Provider Department Center  02/17/2023  3:15 PM [facility] NURSE [facility]  02/17/2023  3:30 PM [facility] US5 [facility]  02/24/2023  2:15 PM [facility] NURSE [facility]  02/24/2023  2:30 PM [facility] US5 [facility]  03/03/2023  2:15 PM [facility] NURSE [facility]  03/03/2023  2:30 PM [facility] US5 [facility]  03/04/2023  8:35 AM Sue Lush, FNP [facility] None  03/10/2023  2:15 PM [facility] NURSE [facility]  03/10/2023  2:30 PM [facility] US3 [facility]  03/11/2023  8:55 AM Gerrit Heck, CNM [facility] None  03/17/2023  8:55 AM Leftwich-Kirby, Wilmer Floor, CNM [facility] None  03/17/2023  1:15 PM [facility] NURSE [facility]  03/17/2023  1:30 PM [facility] US4 [facility]  03/23/2023  8:55 AM Leftwich-Kirby,  Wilmer Floor, CNM [facility] None    Sharen Counter, CNM

## 2023-02-17 NOTE — Progress Notes (Signed)
Pt. Presents for ROB. Pt. States that she is having a lot of discharge. Symptoms include: itching, burning after sex, thick and white in color.

## 2023-02-18 LAB — CERVICOVAGINAL ANCILLARY ONLY
Bacterial Vaginitis (gardnerella): POSITIVE — AB
Candida Glabrata: NEGATIVE
Candida Vaginitis: POSITIVE — AB
Chlamydia: NEGATIVE
Comment: NEGATIVE
Comment: NEGATIVE
Comment: NEGATIVE
Comment: NEGATIVE
Comment: NEGATIVE
Comment: NORMAL
Neisseria Gonorrhea: NEGATIVE
Trichomonas: NEGATIVE

## 2023-02-21 LAB — CULTURE, BETA STREP (GROUP B ONLY): Strep Gp B Culture: NEGATIVE

## 2023-02-24 ENCOUNTER — Ambulatory Visit: Payer: Medicaid Other | Admitting: *Deleted

## 2023-02-24 ENCOUNTER — Encounter: Payer: Self-pay | Admitting: *Deleted

## 2023-02-24 ENCOUNTER — Ambulatory Visit: Payer: Medicaid Other

## 2023-02-24 ENCOUNTER — Ambulatory Visit: Payer: Medicaid Other | Attending: Obstetrics | Admitting: *Deleted

## 2023-02-24 DIAGNOSIS — O99213 Obesity complicating pregnancy, third trimester: Secondary | ICD-10-CM | POA: Insufficient documentation

## 2023-02-24 DIAGNOSIS — Z3A35 35 weeks gestation of pregnancy: Secondary | ICD-10-CM | POA: Diagnosis not present

## 2023-02-24 DIAGNOSIS — O99843 Bariatric surgery status complicating pregnancy, third trimester: Secondary | ICD-10-CM | POA: Diagnosis not present

## 2023-02-24 DIAGNOSIS — O09293 Supervision of pregnancy with other poor reproductive or obstetric history, third trimester: Secondary | ICD-10-CM | POA: Diagnosis not present

## 2023-02-24 DIAGNOSIS — E669 Obesity, unspecified: Secondary | ICD-10-CM

## 2023-02-24 NOTE — Procedures (Signed)
Monica [name] June 24, 1995 [redacted]w[redacted]d  Fetus A Non-Stress Test Interpretation for 02/24/23  Indication:  Obesity, hx gastric bypass  Fetal Heart Rate A Mode: External Baseline Rate (A): 135 bpm Variability: Moderate Accelerations: 15 x 15 Decelerations: None Multiple birth?: No  Uterine Activity Mode: Palpation, Toco Contraction Frequency (min): None Resting Tone Palpated: Relaxed Resting Time: Adequate  Interpretation (Fetal Testing) Nonstress Test Interpretation: Reactive Comments: Dr. Grace Bushy reviewed tracing.

## 2023-03-03 ENCOUNTER — Ambulatory Visit: Payer: Medicaid Other | Admitting: *Deleted

## 2023-03-03 ENCOUNTER — Ambulatory Visit: Payer: Medicaid Other | Attending: Obstetrics

## 2023-03-03 ENCOUNTER — Encounter: Payer: Self-pay | Admitting: *Deleted

## 2023-03-03 DIAGNOSIS — E669 Obesity, unspecified: Secondary | ICD-10-CM

## 2023-03-03 DIAGNOSIS — Z3A36 36 weeks gestation of pregnancy: Secondary | ICD-10-CM

## 2023-03-03 DIAGNOSIS — O34219 Maternal care for unspecified type scar from previous cesarean delivery: Secondary | ICD-10-CM

## 2023-03-03 DIAGNOSIS — O99843 Bariatric surgery status complicating pregnancy, third trimester: Secondary | ICD-10-CM

## 2023-03-03 DIAGNOSIS — O99013 Anemia complicating pregnancy, third trimester: Secondary | ICD-10-CM

## 2023-03-03 DIAGNOSIS — O99213 Obesity complicating pregnancy, third trimester: Secondary | ICD-10-CM | POA: Insufficient documentation

## 2023-03-03 DIAGNOSIS — D563 Thalassemia minor: Secondary | ICD-10-CM

## 2023-03-03 DIAGNOSIS — O09293 Supervision of pregnancy with other poor reproductive or obstetric history, third trimester: Secondary | ICD-10-CM

## 2023-03-04 ENCOUNTER — Ambulatory Visit (INDEPENDENT_AMBULATORY_CARE_PROVIDER_SITE_OTHER): Payer: Medicaid Other | Admitting: Obstetrics and Gynecology

## 2023-03-04 VITALS — BP 98/66 | HR 68 | Wt 183.1 lb

## 2023-03-04 DIAGNOSIS — Z98891 History of uterine scar from previous surgery: Secondary | ICD-10-CM

## 2023-03-04 DIAGNOSIS — Z9884 Bariatric surgery status: Secondary | ICD-10-CM

## 2023-03-04 DIAGNOSIS — O0990 Supervision of high risk pregnancy, unspecified, unspecified trimester: Secondary | ICD-10-CM

## 2023-03-04 DIAGNOSIS — D563 Thalassemia minor: Secondary | ICD-10-CM

## 2023-03-04 DIAGNOSIS — Z3A36 36 weeks gestation of pregnancy: Secondary | ICD-10-CM

## 2023-03-04 DIAGNOSIS — O09299 Supervision of pregnancy with other poor reproductive or obstetric history, unspecified trimester: Secondary | ICD-10-CM

## 2023-03-04 NOTE — Progress Notes (Signed)
    PRENATAL VISIT NOTE  Subjective:  Monica [name] is a 27 y.o. [id number] at [redacted]w[redacted]d being seen today for ongoing prenatal care.  She is currently monitored for the following issues for this high-risk pregnancy and has Supervision of high risk pregnancy, antepartum; History of C-section; History of pre-eclampsia in prior pregnancy, currently pregnant; Morbid obesity (HCC); History of gastric bypass; and Alpha thalassemia silent carrier on their problem list.  Patient reports no bleeding, no contractions, no cramping, no leaking, and does feel some pelvic pain and heaviness occasionally .  Contractions: Irritability. Vag. Bleeding: None.  Movement: Present. Denies leaking of fluid.   The following portions of the patient's history were reviewed and updated as appropriate: allergies, current medications, past family history, past medical history, past social history, past surgical history and problem list.   Objective:   Vitals:   03/04/23 0847  BP: 98/66  Pulse: 68  Weight: 183 lb 1.6 oz (83.1 kg)    Fetal Status: Fetal Heart Rate (bpm): 136   Movement: Present     General:  Alert, oriented and cooperative. Patient is in no acute distress.  Skin: Skin is warm and dry. No rash noted.   Cardiovascular: Normal heart rate noted  Respiratory: Normal respiratory effort, no problems with respiration noted  Abdomen: Soft, gravid, appropriate for gestational age.  Pain/Pressure: Present     Pelvic: Cervical exam deferred        Extremities: Normal range of motion.  Edema: None  Mental Status: Normal mood and affect. Normal behavior. Normal judgment and thought content.   Assessment and Plan:  Pregnancy: [id number] at [redacted]w[redacted]d 1. Supervision of high risk pregnancy, antepartum -GBS Negative  2. History of C-section -Repeat CS scheduled  3. History of pre-eclampsia in prior pregnancy, currently pregnant -BP remains normal  4. Morbid obesity (HCC)   5. History of gastric  bypass   6. Alpha thalassemia silent carrier   7. [redacted] weeks gestation of pregnancy   Preterm labor symptoms and general obstetric precautions including but not limited to vaginal bleeding, contractions, leaking of fluid and fetal movement were reviewed in detail with the patient. Please refer to After Visit Summary for other counseling recommendations.   No follow-ups on file.  Future Appointments  Date Time Provider Department Center  03/10/2023  2:15 PM [facility] NURSE [facility]  03/10/2023  2:30 PM [facility] US3 [facility]  03/11/2023  8:55 AM Gerrit Heck, CNM [facility] None  03/17/2023  8:55 AM Hurshel Party, CNM [facility] None  03/17/2023  1:15 PM [facility] NURSE [facility]  03/17/2023  1:30 PM [facility] US4 [facility]  03/23/2023  8:55 AM Leftwich-Kirby, Wilmer Floor, CNM [facility] None  03/30/2023  8:55 AM Constant, Gigi Gin, MD [facility] None   RTO 1 week for ROB and weekly thereafter.  Letta Kocher, CNM

## 2023-03-06 NOTE — Patient Instructions (Addendum)
Monica [name]  03/06/2023   Your procedure is scheduled on:  03/23/2023  Arrive at 1015 at Entrance C on [address] at [facility]  and [employer]. You are invited to use the FREE valet parking or use the Visitor's parking deck.  Pick up the phone at the desk and dial [phone].  Call this number if you have problems the morning of surgery: [phone]  Remember:   Do not eat food:(After Midnight) Desps de medianoche.  Do not drink clear liquids: (4 Hours before arrival) 4 horas ante llegada.You may drink clear liquids until arrival at ___1015__.  Clear liquids means a liquid you can see thru.  It can have color such as Cola or Kool aid.  Tea is OK and coffee as long as no milk or creamer of any kind.  Take these medicines the morning of surgery with A SIP OF WATER:  none   Do not wear jewelry, make-up or nail polish.  Do not wear lotions, powders, or perfumes. Do not wear deodorant.  Do not shave 48 hours prior to surgery.  Do not bring valuables to the hospital.  [facility] is not   responsible for any belongings or valuables brought to the hospital.  Contacts, dentures or bridgework may not be worn into surgery.  Leave suitcase in the car. After surgery it may be brought to your room.  For patients admitted to the hospital, checkout time is 11:00 AM the day of              discharge.      Please read over the following fact sheets that you were given:     Preparing for Surgery

## 2023-03-09 ENCOUNTER — Telehealth (HOSPITAL_COMMUNITY): Payer: Self-pay | Admitting: *Deleted

## 2023-03-09 ENCOUNTER — Encounter (HOSPITAL_COMMUNITY): Payer: Self-pay

## 2023-03-09 NOTE — Telephone Encounter (Signed)
Preadmission screen  

## 2023-03-10 ENCOUNTER — Encounter: Payer: Self-pay | Admitting: *Deleted

## 2023-03-10 ENCOUNTER — Ambulatory Visit: Payer: Medicaid Other | Attending: Obstetrics

## 2023-03-10 ENCOUNTER — Other Ambulatory Visit: Payer: Self-pay

## 2023-03-10 ENCOUNTER — Ambulatory Visit: Payer: Medicaid Other | Admitting: *Deleted

## 2023-03-10 DIAGNOSIS — O99213 Obesity complicating pregnancy, third trimester: Secondary | ICD-10-CM | POA: Diagnosis present

## 2023-03-10 DIAGNOSIS — O09293 Supervision of pregnancy with other poor reproductive or obstetric history, third trimester: Secondary | ICD-10-CM

## 2023-03-10 DIAGNOSIS — O99843 Bariatric surgery status complicating pregnancy, third trimester: Secondary | ICD-10-CM | POA: Diagnosis not present

## 2023-03-10 DIAGNOSIS — Z3A37 37 weeks gestation of pregnancy: Secondary | ICD-10-CM

## 2023-03-10 DIAGNOSIS — O34219 Maternal care for unspecified type scar from previous cesarean delivery: Secondary | ICD-10-CM

## 2023-03-10 DIAGNOSIS — O285 Abnormal chromosomal and genetic finding on antenatal screening of mother: Secondary | ICD-10-CM | POA: Diagnosis not present

## 2023-03-10 DIAGNOSIS — E669 Obesity, unspecified: Secondary | ICD-10-CM

## 2023-03-10 DIAGNOSIS — D563 Thalassemia minor: Secondary | ICD-10-CM

## 2023-03-11 ENCOUNTER — Ambulatory Visit (INDEPENDENT_AMBULATORY_CARE_PROVIDER_SITE_OTHER): Payer: Medicaid Other

## 2023-03-11 ENCOUNTER — Encounter (HOSPITAL_COMMUNITY): Payer: Self-pay

## 2023-03-11 VITALS — BP 111/69 | HR 81 | Wt 185.2 lb

## 2023-03-11 DIAGNOSIS — Z3A37 37 weeks gestation of pregnancy: Secondary | ICD-10-CM | POA: Diagnosis not present

## 2023-03-11 DIAGNOSIS — O0990 Supervision of high risk pregnancy, unspecified, unspecified trimester: Secondary | ICD-10-CM

## 2023-03-11 DIAGNOSIS — O0993 Supervision of high risk pregnancy, unspecified, third trimester: Secondary | ICD-10-CM | POA: Diagnosis not present

## 2023-03-11 DIAGNOSIS — O09299 Supervision of pregnancy with other poor reproductive or obstetric history, unspecified trimester: Secondary | ICD-10-CM

## 2023-03-11 DIAGNOSIS — Z98891 History of uterine scar from previous surgery: Secondary | ICD-10-CM

## 2023-03-11 NOTE — Progress Notes (Signed)
   HIGH-RISK PREGNANCY OFFICE VISIT  Patient name: Monica [name] MRN [id number]  Date of birth: Jan 29, 1996 Chief Complaint:   Routine Prenatal Visit  Subjective:   Monica [name] is a 27 y.o. [phone] female at [redacted]w[redacted]d with an Estimated Date of Delivery: 03/29/23 being seen today for ongoing management of a high-risk pregnancy aeb has Supervision of high risk pregnancy, antepartum; History of C-section; History of pre-eclampsia in prior pregnancy, currently pregnant; Morbid obesity (HCC); History of gastric bypass; and Alpha thalassemia silent carrier on their problem list.  Patient presents today, alone, with no complaints.  Patient endorses fetal movement. Patient reports some abdominal cramping and contractions.  Patient denies vaginal concerns including abnormal discharge, leaking of fluid, and bleeding. No issues with urination, constipation, or diarrhea.    Contractions: Irritability. Vag. Bleeding: None.  Movement: Present.  Reviewed past medical,surgical, social, obstetrical and family history as well as problem list, medications and allergies.  Objective   Vitals:   03/11/23 0914  BP: 111/69  Pulse: 81  Weight: 185 lb 3.2 oz (84 kg)  Body mass index is 33.87 kg/m.  Total Weight Gain:-47 lb 12.8 oz (-21.7 kg)         Physical Examination:   General appearance: Well appearing, and in no distress  Mental status: Alert, oriented to person, place, and time  Skin: Warm & dry  Cardiovascular: Normal heart rate noted  Respiratory: Normal respiratory effort, no distress  Abdomen: Soft, gravid, nontender, AGA with    Pelvic: Cervical exam deferred           Extremities: Edema: None  Fetal Status: Fetal Heart Rate (bpm): 149  Movement: Present   No results found for this or any previous visit (from the past 24 hour(s)).  Assessment & Plan:  High-risk pregnancy of a 27 y.o., [id number] at [redacted]w[redacted]d with an Estimated Date of Delivery: 03/29/23   1. Supervision of high  risk pregnancy, antepartum -Anticipatory guidance for upcoming appts. -Patient to schedule next appt in 1 weeks for an in-person visit.   2. [redacted] weeks gestation of pregnancy -Doing well.  3. History of C-section -Scheduled for 10/28 -Reports desire for PPIUD. -Reviewed hormonal vs non-hormonal method.  Information provided in AVS. -Patient to decide on surgery day.   4. History of pre-eclampsia in prior pregnancy, currently pregnant -BP normotensive today.       Meds: No orders of the defined types were placed in this encounter.  Labs/procedures today:  Lab Orders  No laboratory test(s) ordered today     Reviewed: Term labor symptoms and general obstetric precautions including but not limited to vaginal bleeding, contractions, leaking of fluid and fetal movement were reviewed in detail with the patient.  All questions were answered.  Follow-up: No follow-ups on file.  No orders of the defined types were placed in this encounter.  Cherre Robins MSN, CNM 03/11/2023

## 2023-03-11 NOTE — Progress Notes (Signed)
Pt presents for ROB visit. Pt c.o pelvic pain.

## 2023-03-16 ENCOUNTER — Encounter (HOSPITAL_COMMUNITY): Payer: Self-pay | Admitting: Obstetrics and Gynecology

## 2023-03-16 ENCOUNTER — Inpatient Hospital Stay (HOSPITAL_COMMUNITY)
Admission: AD | Admit: 2023-03-16 | Discharge: 2023-03-16 | Disposition: A | Discharge status: Home / Self Care | Payer: Medicaid Other | Attending: Family Medicine | Admitting: Family Medicine

## 2023-03-16 DIAGNOSIS — Z0371 Encounter for suspected problem with amniotic cavity and membrane ruled out: Secondary | ICD-10-CM | POA: Insufficient documentation

## 2023-03-16 DIAGNOSIS — Z3493 Encounter for supervision of normal pregnancy, unspecified, third trimester: Secondary | ICD-10-CM

## 2023-03-16 DIAGNOSIS — Z3A38 38 weeks gestation of pregnancy: Secondary | ICD-10-CM | POA: Diagnosis not present

## 2023-03-16 DIAGNOSIS — O288 Other abnormal findings on antenatal screening of mother: Secondary | ICD-10-CM

## 2023-03-16 LAB — POCT FERN TEST
POCT Fern Test: NEGATIVE
POCT Fern Test: NEGATIVE

## 2023-03-16 NOTE — MAU Note (Signed)
.  Monica [name] is a 27 y.o. at [redacted]w[redacted]d here in MAU reporting: she had a gush of fluid after intercourse at 35 . Reports she has continued to have fluid in her underwear but has not had to wear a pad. Mild contractions per pt. Reports positive fetal movement.  Onset of complaint: 0330 Pain score: 0/10 Vitals:   03/16/23 1212  BP: 110/67  Pulse: 84  Resp: 17  Temp: 98.8 F (37.1 C)  SpO2: 98%     FHT:141 Lab orders placed from triage: none

## 2023-03-16 NOTE — MAU Provider Note (Signed)
History     CSN: [id number]  Arrival date and time: 03/16/23 1155   Event Date/Time   First Provider Initiated Contact with Patient 03/16/23 1238      Chief Complaint  Patient presents with   Rupture of Membranes   HPI This is a 27 year old G5 P3-0-1-3 at 38 weeks and 1 day who presents with leaking fluid.  She did have sex last night and afterwards had a gush of fluid and then has had some small continued leaking since then.  She reports good fetal movement.  No contractions.  No other vaginal discharge.  OB History     Gravida  5   Para  3   Term  3   Preterm      AB  1   Living  3      SAB      IAB  1   Ectopic      Multiple  0   Live Births  3           Past Medical History:  Diagnosis Date   Anxiety    Chronic hypertension affecting pregnancy 12/08/2017   [x ] Aspirin 81 mg daily after 12 weeks  Current antihypertensives:  None      Baseline and surveillance labs (pulled in from [facility], refresh links as needed)       Lab Results  Component  Value  Date     PLT  332  09/08/2019     CREATININE  0.46 (L)  09/08/2019     AST  14  09/08/2019     ALT  9  09/08/2019     PROTCRRATIO  11.61 (H)  11/03/2012            UPC                                          Chronic hypertension affecting pregnancy 09/15/2017   Arly.Keller ] Aspirin 81 mg daily after 12 weeks; discontinue after 36 weeks  Current antihypertensives:  None      Baseline and surveillance labs (pulled in from [facility], refresh links as needed)       Lab Results  Component  Value  Date     PLT  322  09/15/2017     CREATININE  0.42 (L)  09/15/2017     AST  13  09/15/2017     ALT  9  09/15/2017     PROTCRRATIO  11.61 (H)  11/03/2012        Antenatal Testing    Depression    H/O pre-eclampsia in prior pregnancy, currently pregnant 11/10/2017   First pregnancy   Headache    Hypertension    Pregnancy induced hypertension    Vaginal Pap smear, abnormal    Vitamin D deficiency 03/10/2022    Past Surgical  History:  Procedure Laterality Date   arthoscopic knee Right 2013   CESAREAN SECTION N/A 11/05/2012   Procedure: CESAREAN SECTION;  Surgeon: Antionette Char, MD;  Location: [facility] ORS;  Service: Obstetrics;  Laterality: N/A;   CESAREAN SECTION N/A 04/02/2018   Procedure: CESAREAN SECTION;  Surgeon: Lesly Dukes, MD;  Location: [facility] BIRTHING SU[facility];  Service: Obstetrics;  Laterality: N/A;   CESAREAN SECTION N/A 03/06/2020   Procedure: CESAREAN SECTION;  Surgeon: Levie Heritage, DO;  Location: [facility] LD ORS;  Service: Obstetrics;  Laterality: N/A;  COLPOSCOPY  05/07/2016   LAPAROSCOPIC GASTRIC SLEEVE RESECTION  06/04/2022    Family History  Problem Relation Age of Onset   Hypertension Mother    Hypertension Father    Healthy Sister        x1`   Healthy Brother        x1   Asthma Brother    Heart attack Maternal Grandmother    Hypertension Maternal Grandmother    Hearing loss Maternal Grandmother    Kidney disease Maternal Grandmother    Intellectual disability Maternal Aunt    Cancer Neg Hx    Diabetes Neg Hx     Social History   Tobacco Use   Smoking status: Never   Smokeless tobacco: Never  Vaping Use   Vaping status: Former  Substance Use Topics   Alcohol use: Not Currently    Comment: sometimes, not since confirmed preg   Drug use: Not Currently    Types: Marijuana    Comment: not since confirmed pregnancy    Allergies:  Allergies  Allergen Reactions   Iodine Rash   Shellfish Allergy Hives and Rash    Medications Prior to Admission  Medication Sig Dispense Refill Last Dose   Prenatal-FeAspGly-Methylfol-FA (PRENATE ELITE) 20-0.6-0.4 MG TABS Take 1 tablet by mouth daily. 30 tablet 11 Past Week   Accu-Chek Softclix Lancets lancets Use 1 lancet to check glucose 4 times daily 100 each 2    aspirin EC 81 MG tablet Take 1 tablet (81 mg total) by mouth daily. Swallow whole. 30 tablet 12 Unknown   Blood Pressure Monitoring (BLOOD PRESSURE KIT) DEVI 1 kit by Does not  apply route once a week. (Patient not taking: Reported on 03/04/2023) 1 each 0    calcium carbonate (OSCAL) 1500 (600 Ca) MG TABS tablet Take 600 mg of elemental calcium by mouth daily.   Unknown   Cholecalciferol (VITAMIN D) 50 MCG (2000 UT) tablet Take 2,000 Units by mouth 2 (two) times daily.   Unknown   glucose blood (ACCU-CHEK GUIDE) test strip Use 1 test strip to check glucose 4 times daily (Patient not taking: Reported on 03/04/2023) 100 each 2    nystatin cream (MYCOSTATIN) Apply 1 Application topically 2 (two) times daily as needed for dry skin. 30 g 0     Review of Systems Physical Exam   Blood pressure 110/67, pulse 84, temperature 98.8 F (37.1 C), resp. rate 17, height 5\' 2"  (1.575 m), weight 82.6 kg, last menstrual period 06/16/2022, SpO2 98%.  Physical Exam Vitals reviewed. Exam conducted with a chaperone present.  Constitutional:      Appearance: Normal appearance.  Cardiovascular:     Rate and Rhythm: Normal rate.  Pulmonary:     Effort: Pulmonary effort is normal.  Genitourinary:    Labia:        Right: No rash, tenderness or lesion.        Left: No rash, tenderness or lesion.      Comments: Cervix closed by visual exam. No fluid from cervix with Valsalva.  There is a small amount of thin white discharge in the vaginal canal. Neurological:     Mental Status: She is alert.  Psychiatric:        Mood and Affect: Mood normal.        Behavior: Behavior normal.        Thought Content: Thought content normal.        Judgment: Judgment normal.     MAU Course  Procedures NST:  Baseline: 130  Variability: moderate Accelerations: present  Decelerations: none Contractions: none    MDM Negative ferning  Assessment and Plan   1. [redacted] weeks gestation of pregnancy   2. Non-reactive NST (non-stress test)   3. Intact amniotic membranes during pregnancy in third trimester    Discharge to home Return precautions given.  Levie Heritage 03/16/2023, 12:50 PM

## 2023-03-17 ENCOUNTER — Ambulatory Visit: Payer: Medicaid Other

## 2023-03-17 ENCOUNTER — Ambulatory Visit (INDEPENDENT_AMBULATORY_CARE_PROVIDER_SITE_OTHER): Payer: Medicaid Other | Admitting: Advanced Practice Midwife

## 2023-03-17 ENCOUNTER — Ambulatory Visit: Payer: Medicaid Other | Attending: Obstetrics

## 2023-03-17 ENCOUNTER — Ambulatory Visit: Payer: Medicaid Other | Admitting: *Deleted

## 2023-03-17 ENCOUNTER — Other Ambulatory Visit: Payer: Self-pay | Admitting: Obstetrics

## 2023-03-17 ENCOUNTER — Other Ambulatory Visit: Payer: Self-pay

## 2023-03-17 VITALS — BP 100/63 | HR 83 | Wt 185.1 lb

## 2023-03-17 DIAGNOSIS — O09293 Supervision of pregnancy with other poor reproductive or obstetric history, third trimester: Secondary | ICD-10-CM | POA: Diagnosis not present

## 2023-03-17 DIAGNOSIS — O99213 Obesity complicating pregnancy, third trimester: Secondary | ICD-10-CM

## 2023-03-17 DIAGNOSIS — O0990 Supervision of high risk pregnancy, unspecified, unspecified trimester: Secondary | ICD-10-CM

## 2023-03-17 DIAGNOSIS — O34219 Maternal care for unspecified type scar from previous cesarean delivery: Secondary | ICD-10-CM

## 2023-03-17 DIAGNOSIS — Z9884 Bariatric surgery status: Secondary | ICD-10-CM

## 2023-03-17 DIAGNOSIS — D563 Thalassemia minor: Secondary | ICD-10-CM | POA: Diagnosis present

## 2023-03-17 DIAGNOSIS — O99843 Bariatric surgery status complicating pregnancy, third trimester: Secondary | ICD-10-CM | POA: Diagnosis not present

## 2023-03-17 DIAGNOSIS — O285 Abnormal chromosomal and genetic finding on antenatal screening of mother: Secondary | ICD-10-CM

## 2023-03-17 DIAGNOSIS — Z3A38 38 weeks gestation of pregnancy: Secondary | ICD-10-CM

## 2023-03-17 DIAGNOSIS — O283 Abnormal ultrasonic finding on antenatal screening of mother: Secondary | ICD-10-CM | POA: Diagnosis not present

## 2023-03-17 DIAGNOSIS — O0993 Supervision of high risk pregnancy, unspecified, third trimester: Secondary | ICD-10-CM

## 2023-03-17 DIAGNOSIS — E669 Obesity, unspecified: Secondary | ICD-10-CM

## 2023-03-17 DIAGNOSIS — Z98891 History of uterine scar from previous surgery: Secondary | ICD-10-CM

## 2023-03-17 DIAGNOSIS — Z3009 Encounter for other general counseling and advice on contraception: Secondary | ICD-10-CM

## 2023-03-17 NOTE — Procedures (Signed)
Monica [name] Sep 25, 1995 [redacted]w[redacted]d  Fetus A Non-Stress Test Interpretation for 03/17/23-NST with BPP  Indication: Unsatisfactory BPP and obese  Fetal Heart Rate A Mode: External Baseline Rate (A): 145 bpm Variability: Moderate Accelerations: 15 x 15 Decelerations: None Multiple birth?: No  Uterine Activity Mode: Toco Contraction Frequency (min): 1 uc during NST Contraction Duration (sec): 60 Contraction Quality: Mild Resting Tone Palpated: Relaxed  Interpretation (Fetal Testing) Nonstress Test Interpretation: Reactive Comments: [place] reviewed by DR. Parke Poisson

## 2023-03-17 NOTE — Progress Notes (Signed)
   PRENATAL VISIT NOTE  Subjective:  Monica [name] is a 27 y.o. [id number] at [redacted]w[redacted]d being seen today for ongoing prenatal care.  She is currently monitored for the following issues for this low-risk pregnancy and has Supervision of high risk pregnancy, antepartum; History of C-section; History of pre-eclampsia in prior pregnancy, currently pregnant; Morbid obesity (HCC); History of gastric bypass; and Alpha thalassemia silent carrier on their problem list.  Patient reports no complaints.  Contractions: Not present. Vag. Bleeding: None.  Movement: Present. Denies leaking of fluid.   The following portions of the patient's history were reviewed and updated as appropriate: allergies, current medications, past family history, past medical history, past social history, past surgical history and problem list.   Objective:   Vitals:   03/17/23 0852  BP: 100/63  Pulse: 83  Weight: 185 lb 1.6 oz (84 kg)    Fetal Status: Fetal Heart Rate (bpm): 137 Fundal Height: 37 cm Movement: Present     General:  Alert, oriented and cooperative. Patient is in no acute distress.  Skin: Skin is warm and dry. No rash noted.   Cardiovascular: Normal heart rate noted  Respiratory: Normal respiratory effort, no problems with respiration noted  Abdomen: Soft, gravid, appropriate for gestational age.  Pain/Pressure: Absent     Pelvic: Cervical exam deferred        Extremities: Normal range of motion.  Edema: None  Mental Status: Normal mood and affect. Normal behavior. Normal judgment and thought content.   Assessment and Plan:  Pregnancy: [id number] at [redacted]w[redacted]d 1. Supervision of low risk pregnancy, antepartum --Pt was listed as high risk but after gastric bypass, health has improved, BMI lower than prepregnancy, and no HTN or other pregnancy complications --Anticipatory guidance about next visits/weeks of pregnancy given.   2. History of C-section --C/S x 3, plans repeat  3. History of gastric  bypass   4. [redacted] weeks gestation of pregnancy  5. Encounter for counseling regarding contraception --Discussed pt contraceptive plans and reviewed contraceptive methods based on pt preferences and effectiveness.  Pt prefers for her husband to have a vasectomy but he is undecided.  Pt may want IUD at hospital, not sure if she would want Mirena or Paragard. Reviewed benefits/risks of both and alternative options.  Contact info given to [facility] and pt sent to [facility] website for contraceptive information.   Term labor symptoms and general obstetric precautions including but not limited to vaginal bleeding, contractions, leaking of fluid and fetal movement were reviewed in detail with the patient. Please refer to After Visit Summary for other counseling recommendations.   Return for schedule 6 week PP visit, pt has C/S scheduled 10/28.  Future Appointments  Date Time Provider Department Center  03/17/2023  1:15 PM [facility] NURSE [facility]  03/17/2023  1:30 PM [facility] US4 [facility]  03/20/2023 10:30 AM [facility] PAT 1 [facility] None  03/23/2023  8:55 AM Leftwich-Kirby, Wilmer Floor, CNM [facility] None  03/30/2023  8:55 AM Constant, Gigi Gin, MD [facility] None    Sharen Counter, CNM

## 2023-03-17 NOTE — Patient Instructions (Addendum)
For vasectomy:  [facility] [address] [place], [place] [address] [phone]  For contraceptive info: [email or web address]

## 2023-03-20 ENCOUNTER — Ambulatory Visit (HOSPITAL_COMMUNITY)
Admission: RE | Admit: 2023-03-20 | Discharge: 2023-03-20 | Disposition: A | Discharge status: Home / Self Care | Payer: Medicaid Other | Source: Ambulatory Visit | Attending: Family Medicine | Admitting: Family Medicine

## 2023-03-20 DIAGNOSIS — O34219 Maternal care for unspecified type scar from previous cesarean delivery: Secondary | ICD-10-CM | POA: Diagnosis not present

## 2023-03-20 DIAGNOSIS — Z01818 Encounter for other preprocedural examination: Secondary | ICD-10-CM | POA: Insufficient documentation

## 2023-03-20 DIAGNOSIS — Z3A39 39 weeks gestation of pregnancy: Secondary | ICD-10-CM | POA: Diagnosis not present

## 2023-03-20 DIAGNOSIS — Z98891 History of uterine scar from previous surgery: Secondary | ICD-10-CM

## 2023-03-20 LAB — CBC
HCT: 32.8 % — ABNORMAL LOW (ref 36.0–46.0)
Hemoglobin: 10.5 g/dL — ABNORMAL LOW (ref 12.0–15.0)
MCH: 24.9 pg — ABNORMAL LOW (ref 26.0–34.0)
MCHC: 32 g/dL (ref 30.0–36.0)
MCV: 77.7 fL — ABNORMAL LOW (ref 80.0–100.0)
Platelets: 248 10*3/uL (ref 150–400)
RBC: 4.22 MIL/uL (ref 3.87–5.11)
RDW: 13.3 % (ref 11.5–15.5)
WBC: 8.4 10*3/uL (ref 4.0–10.5)
nRBC: 0 % (ref 0.0–0.2)

## 2023-03-20 LAB — RPR: RPR Ser Ql: NONREACTIVE

## 2023-03-20 LAB — TYPE AND SCREEN
ABO/RH(D): O POS
Antibody Screen: NEGATIVE

## 2023-03-23 ENCOUNTER — Other Ambulatory Visit: Payer: Self-pay

## 2023-03-23 ENCOUNTER — Inpatient Hospital Stay (HOSPITAL_COMMUNITY)
Admission: RE | Admit: 2023-03-23 | Discharge: 2023-03-25 | DRG: 788 | Disposition: A | Discharge status: Home / Self Care | Payer: Medicaid Other | Attending: Family Medicine | Admitting: Family Medicine

## 2023-03-23 ENCOUNTER — Inpatient Hospital Stay (HOSPITAL_COMMUNITY): Payer: Medicaid Other | Admitting: Anesthesiology

## 2023-03-23 ENCOUNTER — Encounter: Payer: Medicaid Other | Admitting: Advanced Practice Midwife

## 2023-03-23 ENCOUNTER — Encounter (HOSPITAL_COMMUNITY)
Admission: RE | Disposition: A | Discharge status: Home / Self Care | Payer: Self-pay | Source: Home / Self Care | Attending: Family Medicine

## 2023-03-23 ENCOUNTER — Encounter (HOSPITAL_COMMUNITY): Payer: Self-pay | Admitting: Family Medicine

## 2023-03-23 DIAGNOSIS — Z8249 Family history of ischemic heart disease and other diseases of the circulatory system: Secondary | ICD-10-CM

## 2023-03-23 DIAGNOSIS — Z148 Genetic carrier of other disease: Secondary | ICD-10-CM | POA: Diagnosis not present

## 2023-03-23 DIAGNOSIS — Z822 Family history of deafness and hearing loss: Secondary | ICD-10-CM

## 2023-03-23 DIAGNOSIS — Z3A39 39 weeks gestation of pregnancy: Secondary | ICD-10-CM

## 2023-03-23 DIAGNOSIS — Z91041 Radiographic dye allergy status: Secondary | ICD-10-CM | POA: Diagnosis not present

## 2023-03-23 DIAGNOSIS — Z98891 History of uterine scar from previous surgery: Principal | ICD-10-CM

## 2023-03-23 DIAGNOSIS — O99844 Bariatric surgery status complicating childbirth: Secondary | ICD-10-CM | POA: Diagnosis present

## 2023-03-23 DIAGNOSIS — O99214 Obesity complicating childbirth: Secondary | ICD-10-CM | POA: Diagnosis present

## 2023-03-23 DIAGNOSIS — Z30430 Encounter for insertion of intrauterine contraceptive device: Secondary | ICD-10-CM

## 2023-03-23 DIAGNOSIS — Z825 Family history of asthma and other chronic lower respiratory diseases: Secondary | ICD-10-CM

## 2023-03-23 DIAGNOSIS — Z975 Presence of (intrauterine) contraceptive device: Secondary | ICD-10-CM

## 2023-03-23 DIAGNOSIS — Z810 Family history of intellectual disabilities: Secondary | ICD-10-CM | POA: Diagnosis not present

## 2023-03-23 DIAGNOSIS — O34211 Maternal care for low transverse scar from previous cesarean delivery: Principal | ICD-10-CM | POA: Diagnosis present

## 2023-03-23 DIAGNOSIS — Z91013 Allergy to seafood: Secondary | ICD-10-CM | POA: Diagnosis not present

## 2023-03-23 DIAGNOSIS — O900 Disruption of cesarean delivery wound: Secondary | ICD-10-CM | POA: Insufficient documentation

## 2023-03-23 LAB — CREATININE, SERUM
Creatinine, Ser: 0.53 mg/dL (ref 0.44–1.00)
GFR, Estimated: >60 mL/min (ref >60)

## 2023-03-23 SURGERY — Surgical Case
Anesthesia: Spinal

## 2023-03-23 MED ORDER — BUPIVACAINE IN DEXTROSE 0.75-8.25 % IT SOLN
INTRATHECAL | Status: DC | PRN
  Administered 2023-03-23: 1.6 mL via INTRATHECAL

## 2023-03-23 MED ORDER — SODIUM CHLORIDE 0.9% FLUSH: 10.0000 mL | Freq: Two times a day (BID) | INTRAVENOUS | Status: DC

## 2023-03-23 MED ORDER — EPHEDRINE SULFATE-NACL 50-0.9 MG/10ML-% IV SOSY
PREFILLED_SYRINGE | INTRAVENOUS | Status: DC | PRN
  Administered 2023-03-23 (×2): 10 mg via INTRAVENOUS
  Administered 2023-03-23: 5 mg via INTRAVENOUS

## 2023-03-23 MED ORDER — SENNOSIDES-DOCUSATE SODIUM 8.6-50 MG PO TABS
2.0000 | ORAL_TABLET | Freq: Every day | ORAL | Status: DC
  Administered 2023-03-24 – 2023-03-25 (×2): 2 via ORAL
  Filled 2023-03-23 (×2): qty 2

## 2023-03-23 MED ORDER — MORPHINE SULFATE (PF) 0.5 MG/ML IJ SOLN
INTRAMUSCULAR | Status: AC
  Filled 2023-03-23: qty 10

## 2023-03-23 MED ORDER — PHENYLEPHRINE HCL-NACL 20-0.9 MG/250ML-% IV SOLN
INTRAVENOUS | Status: DC | PRN
  Administered 2023-03-23: 60 ug/min via INTRAVENOUS

## 2023-03-23 MED ORDER — AMISULPRIDE (ANTIEMETIC) 5 MG/2ML IV SOLN: 10.0000 mg | Freq: Once | INTRAVENOUS | Status: DC | PRN

## 2023-03-23 MED ORDER — SCOPOLAMINE 1 MG/3DAYS TD PT72
1.0000 | MEDICATED_PATCH | Freq: Once | TRANSDERMAL | Status: DC
  Administered 2023-03-23: 1.5 mg via TRANSDERMAL

## 2023-03-23 MED ORDER — ACETAMINOPHEN 10 MG/ML IV SOLN
INTRAVENOUS | Status: DC | PRN
  Administered 2023-03-23: 1000 mg via INTRAVENOUS

## 2023-03-23 MED ORDER — ONDANSETRON HCL 4 MG/2ML IJ SOLN: 4.0000 mg | Freq: Three times a day (TID) | INTRAMUSCULAR | Status: DC | PRN

## 2023-03-23 MED ORDER — OXYTOCIN-SODIUM CHLORIDE 30-0.9 UT/500ML-% IV SOLN
INTRAVENOUS | Status: AC
Start: 2023-03-23 — End: ?
  Filled 2023-03-23: qty 500

## 2023-03-23 MED ORDER — MORPHINE SULFATE (PF) 0.5 MG/ML IJ SOLN
INTRAMUSCULAR | Status: DC | PRN
  Administered 2023-03-23: 150 ug via EPIDURAL

## 2023-03-23 MED ORDER — FENTANYL CITRATE (PF) 100 MCG/2ML IJ SOLN
INTRAMUSCULAR | Status: DC | PRN
  Administered 2023-03-23: 15 ug via INTRATHECAL

## 2023-03-23 MED ORDER — PHENYLEPHRINE HCL (PRESSORS) 10 MG/ML IV SOLN
INTRAVENOUS | Status: DC | PRN
  Administered 2023-03-23: 80 ug via INTRAVENOUS
  Administered 2023-03-23: 40 ug via INTRAVENOUS
  Administered 2023-03-23: 80 ug via INTRAVENOUS
  Administered 2023-03-23: 40 ug via INTRAVENOUS

## 2023-03-23 MED ORDER — SIMETHICONE 80 MG PO CHEW: 80.0000 mg | CHEWABLE_TABLET | ORAL | Status: DC | PRN

## 2023-03-23 MED ORDER — ONDANSETRON HCL 4 MG/2ML IJ SOLN
INTRAMUSCULAR | Status: DC | PRN
  Administered 2023-03-23: 4 mg via INTRAVENOUS

## 2023-03-23 MED ORDER — OXYCODONE HCL 5 MG/5ML PO SOLN
5.0000 mg | Freq: Once | ORAL | Status: DC | PRN
Start: 2023-03-23 — End: 2023-03-23

## 2023-03-23 MED ORDER — NALOXONE HCL 0.4 MG/ML IJ SOLN: 0.4000 mg | INTRAMUSCULAR | Status: DC | PRN

## 2023-03-23 MED ORDER — DEXAMETHASONE SODIUM PHOSPHATE 10 MG/ML IJ SOLN
INTRAMUSCULAR | Status: AC
  Filled 2023-03-23: qty 1

## 2023-03-23 MED ORDER — MENTHOL 3 MG MT LOZG: 1.0000 | LOZENGE | OROMUCOSAL | Status: DC | PRN

## 2023-03-23 MED ORDER — MAGNESIUM HYDROXIDE 400 MG/5ML PO SUSP: 30.0000 mL | ORAL | Status: DC | PRN

## 2023-03-23 MED ORDER — SCOPOLAMINE 1 MG/3DAYS TD PT72
MEDICATED_PATCH | TRANSDERMAL | Status: AC
  Filled 2023-03-23: qty 1

## 2023-03-23 MED ORDER — PHENYLEPHRINE 80 MCG/ML (10ML) SYRINGE FOR IV PUSH (FOR BLOOD PRESSURE SUPPORT)
PREFILLED_SYRINGE | INTRAVENOUS | Status: AC
Start: 2023-03-23 — End: ?
  Filled 2023-03-23: qty 10

## 2023-03-23 MED ORDER — POVIDONE-IODINE 10 % EX SWAB: 2.0000 | Freq: Once | CUTANEOUS | Status: DC

## 2023-03-23 MED ORDER — GABAPENTIN 100 MG PO CAPS
200.0000 mg | ORAL_CAPSULE | Freq: Every day | ORAL | Status: DC
  Administered 2023-03-24 (×2): 200 mg via ORAL
  Filled 2023-03-23 (×2): qty 2

## 2023-03-23 MED ORDER — CEFAZOLIN SODIUM-DEXTROSE 2-4 GM/100ML-% IV SOLN
2.0000 g | INTRAVENOUS | Status: AC
  Administered 2023-03-23: 2 g via INTRAVENOUS

## 2023-03-23 MED ORDER — ENOXAPARIN SODIUM 40 MG/0.4ML IJ SOSY
40.0000 mg | PREFILLED_SYRINGE | INTRAMUSCULAR | Status: DC
  Administered 2023-03-24 – 2023-03-25 (×2): 40 mg via SUBCUTANEOUS
  Filled 2023-03-23 (×2): qty 0.4

## 2023-03-23 MED ORDER — OXYTOCIN-SODIUM CHLORIDE 30-0.9 UT/500ML-% IV SOLN
INTRAVENOUS | Status: DC | PRN
  Administered 2023-03-23: 400 mL via INTRAVENOUS

## 2023-03-23 MED ORDER — CEFAZOLIN SODIUM-DEXTROSE 2-4 GM/100ML-% IV SOLN
INTRAVENOUS | Status: AC
  Filled 2023-03-23: qty 100

## 2023-03-23 MED ORDER — COCONUT OIL OIL: 1.0000 | TOPICAL_OIL | Status: DC | PRN

## 2023-03-23 MED ORDER — KETOROLAC TROMETHAMINE 30 MG/ML IJ SOLN: 30.0000 mg | Freq: Once | INTRAMUSCULAR | Status: DC | PRN

## 2023-03-23 MED ORDER — IBUPROFEN 600 MG PO TABS
600.0000 mg | ORAL_TABLET | Freq: Four times a day (QID) | ORAL | Status: DC
  Administered 2023-03-24 – 2023-03-25 (×4): 600 mg via ORAL
  Filled 2023-03-23 (×4): qty 1

## 2023-03-23 MED ORDER — LEVONORGESTREL 20 MCG/DAY IU IUD
1.0000 | INTRAUTERINE_SYSTEM | Freq: Once | INTRAUTERINE | Status: AC
  Administered 2023-03-23: 1 via INTRAUTERINE

## 2023-03-23 MED ORDER — DIPHENHYDRAMINE HCL 25 MG PO CAPS: 25.0000 mg | ORAL_CAPSULE | Freq: Four times a day (QID) | ORAL | Status: DC | PRN

## 2023-03-23 MED ORDER — LEVONORGESTREL 20 MCG/DAY IU IUD
INTRAUTERINE_SYSTEM | INTRAUTERINE | Status: AC
  Filled 2023-03-23: qty 1

## 2023-03-23 MED ORDER — DIBUCAINE (PERIANAL) 1 % EX OINT: 1.0000 | TOPICAL_OINTMENT | CUTANEOUS | Status: DC | PRN

## 2023-03-23 MED ORDER — PHENYLEPHRINE HCL-NACL 20-0.9 MG/250ML-% IV SOLN
INTRAVENOUS | Status: AC
Start: 2023-03-23 — End: ?
  Filled 2023-03-23: qty 250

## 2023-03-23 MED ORDER — ACETAMINOPHEN 10 MG/ML IV SOLN
INTRAVENOUS | Status: AC
  Filled 2023-03-23: qty 100

## 2023-03-23 MED ORDER — SODIUM CHLORIDE 0.9% FLUSH
3.0000 mL | INTRAVENOUS | Status: DC | PRN
  Administered 2023-03-24: 3 mL via INTRAVENOUS

## 2023-03-23 MED ORDER — LACTATED RINGERS IV SOLN: INTRAVENOUS | Status: DC

## 2023-03-23 MED ORDER — DIPHENHYDRAMINE HCL 50 MG/ML IJ SOLN: 12.5000 mg | Freq: Four times a day (QID) | INTRAMUSCULAR | Status: DC | PRN

## 2023-03-23 MED ORDER — ONDANSETRON HCL 4 MG/2ML IJ SOLN
INTRAMUSCULAR | Status: AC
Start: 2023-03-23 — End: ?
  Filled 2023-03-23: qty 2

## 2023-03-23 MED ORDER — FENTANYL CITRATE (PF) 100 MCG/2ML IJ SOLN: 25.0000 ug | INTRAMUSCULAR | Status: DC | PRN

## 2023-03-23 MED ORDER — MEPERIDINE HCL 25 MG/ML IJ SOLN: 6.2500 mg | INTRAMUSCULAR | Status: DC | PRN

## 2023-03-23 MED ORDER — ACETAMINOPHEN 10 MG/ML IV SOLN
1000.0000 mg | Freq: Once | INTRAVENOUS | Status: DC | PRN
Start: 2023-03-23 — End: 2023-03-23

## 2023-03-23 MED ORDER — SIMETHICONE 80 MG PO CHEW
80.0000 mg | CHEWABLE_TABLET | Freq: Three times a day (TID) | ORAL | Status: DC
  Administered 2023-03-24 – 2023-03-25 (×4): 80 mg via ORAL
  Filled 2023-03-23 (×4): qty 1

## 2023-03-23 MED ORDER — NALOXONE HCL 4 MG/10ML IJ SOLN: 1.0000 ug/kg/h | INTRAVENOUS | Status: DC | PRN

## 2023-03-23 MED ORDER — PRENATAL MULTIVITAMIN CH
1.0000 | ORAL_TABLET | Freq: Every day | ORAL | Status: DC
  Administered 2023-03-24: 1 via ORAL
  Filled 2023-03-23: qty 1

## 2023-03-23 MED ORDER — TETANUS-DIPHTH-ACELL PERTUSSIS 5-2.5-18.5 LF-MCG/0.5 IM SUSY
0.5000 mL | PREFILLED_SYRINGE | Freq: Once | INTRAMUSCULAR | Status: DC
  Filled 2023-03-23: qty 0.5

## 2023-03-23 MED ORDER — DEXAMETHASONE SODIUM PHOSPHATE 10 MG/ML IJ SOLN
INTRAMUSCULAR | Status: DC | PRN
  Administered 2023-03-23: 10 mg via INTRAVENOUS

## 2023-03-23 MED ORDER — WITCH HAZEL-GLYCERIN EX PADS: 1.0000 | MEDICATED_PAD | CUTANEOUS | Status: DC | PRN

## 2023-03-23 MED ORDER — OXYCODONE HCL 5 MG PO TABS: 5.0000 mg | ORAL_TABLET | Freq: Four times a day (QID) | ORAL | Status: DC | PRN

## 2023-03-23 MED ORDER — OXYTOCIN-SODIUM CHLORIDE 30-0.9 UT/500ML-% IV SOLN
2.5000 [IU]/h | INTRAVENOUS | Status: AC
  Filled 2023-03-23: qty 500

## 2023-03-23 MED ORDER — OXYCODONE HCL 5 MG PO TABS: 5.0000 mg | ORAL_TABLET | Freq: Once | ORAL | Status: DC | PRN

## 2023-03-23 MED ORDER — KETOROLAC TROMETHAMINE 30 MG/ML IJ SOLN
30.0000 mg | Freq: Four times a day (QID) | INTRAMUSCULAR | Status: AC
  Administered 2023-03-23 – 2023-03-24 (×4): 30 mg via INTRAVENOUS
  Filled 2023-03-23 (×4): qty 1

## 2023-03-23 MED ORDER — ACETAMINOPHEN 500 MG PO TABS
1000.0000 mg | ORAL_TABLET | Freq: Four times a day (QID) | ORAL | Status: DC
  Administered 2023-03-23 – 2023-03-25 (×7): 1000 mg via ORAL
  Filled 2023-03-23 (×7): qty 2

## 2023-03-23 MED ORDER — TRANEXAMIC ACID-NACL 1000-0.7 MG/100ML-% IV SOLN
1000.0000 mg | INTRAVENOUS | Status: AC
  Administered 2023-03-23: 1000 mg via INTRAVENOUS

## 2023-03-23 MED ORDER — GLYCOPYRROLATE 0.2 MG/ML IJ SOLN
INTRAMUSCULAR | Status: DC | PRN
  Administered 2023-03-23: .1 mg via INTRAVENOUS

## 2023-03-23 MED ORDER — TRANEXAMIC ACID-NACL 1000-0.7 MG/100ML-% IV SOLN
INTRAVENOUS | Status: AC
Start: 2023-03-23 — End: ?
  Filled 2023-03-23: qty 100

## 2023-03-23 MED ORDER — FENTANYL CITRATE (PF) 100 MCG/2ML IJ SOLN
INTRAMUSCULAR | Status: AC
  Filled 2023-03-23: qty 2

## 2023-03-23 SURGICAL SUPPLY — 38 items
ADH SKN CLS APL DERMABOND .7 (GAUZE/BANDAGES/DRESSINGS) ×2
APL PRP STRL LF DISP 70% ISPRP (MISCELLANEOUS) ×2
APL SKNCLS STERI-STRIP NONHPOA (GAUZE/BANDAGES/DRESSINGS) ×1
BENZOIN TINCTURE PRP APPL 2/3 (GAUZE/BANDAGES/DRESSINGS) IMPLANT
CHLORAPREP W/TINT 26 (MISCELLANEOUS) ×2 IMPLANT
CLAMP UMBILICAL CORD (MISCELLANEOUS) ×1 IMPLANT
CLOTH BEACON ORANGE TIMEOUT ST (SAFETY) ×1 IMPLANT
DERMABOND ADVANCED .7 DNX12 (GAUZE/BANDAGES/DRESSINGS) ×2 IMPLANT
DRSG OPSITE POSTOP 4X10 (GAUZE/BANDAGES/DRESSINGS) ×1 IMPLANT
ELECT REM PT RETURN 9FT ADLT (ELECTROSURGICAL) ×1
ELECTRODE REM PT RTRN 9FT ADLT (ELECTROSURGICAL) ×1 IMPLANT
EXTRACTOR VACUUM M CUP 4 TUBE (SUCTIONS) IMPLANT
GAUZE SPONGE 4X4 12PLY STRL LF (GAUZE/BANDAGES/DRESSINGS) IMPLANT
GLOVE BIOGEL PI IND STRL 7.0 (GLOVE) ×2 IMPLANT
GLOVE BIOGEL PI IND STRL 7.5 (GLOVE) ×2 IMPLANT
GLOVE ECLIPSE 7.5 STRL STRAW (GLOVE) ×1 IMPLANT
GOWN STRL REUS W/TWL LRG LVL3 (GOWN DISPOSABLE) ×3 IMPLANT
KIT ABG SYR 3ML LUER SLIP (SYRINGE) IMPLANT
MAT PREVALON FULL STRYKER (MISCELLANEOUS) IMPLANT
Mirena (Levonorgestrel-releasing intrauterine syst IMPLANT
NDL HYPO 25X5/8 SAFETYGLIDE (NEEDLE) IMPLANT
NEEDLE HYPO 25X5/8 SAFETYGLIDE (NEEDLE) IMPLANT
NS IRRIG 1000ML POUR BTL (IV SOLUTION) ×1 IMPLANT
PACK C SECTION WH (CUSTOM PROCEDURE TRAY) ×1 IMPLANT
PAD OB MATERNITY 4.3X12.25 (PERSONAL CARE ITEMS) ×1 IMPLANT
RETRACTOR TRAXI PANNICULUS (MISCELLANEOUS) IMPLANT
RTRCTR C-SECT PINK 25CM LRG (MISCELLANEOUS) ×1 IMPLANT
SUT MNCRL 0 VIOLET CTX 36 (SUTURE) ×2 IMPLANT
SUT PLAIN 2 0 (SUTURE) ×1
SUT PLAIN ABS 2-0 CT1 27XMFL (SUTURE) IMPLANT
SUT VIC AB 0 CTX 36 (SUTURE) ×1
SUT VIC AB 0 CTX36XBRD ANBCTRL (SUTURE) ×1 IMPLANT
SUT VIC AB 2-0 CT1 27 (SUTURE) ×1
SUT VIC AB 2-0 CT1 TAPERPNT 27 (SUTURE) ×1 IMPLANT
SUT VIC AB 4-0 KS 27 (SUTURE) ×1 IMPLANT
TOWEL OR 17X24 6PK STRL BLUE (TOWEL DISPOSABLE) ×1 IMPLANT
TRAY FOLEY W/BAG SLVR 14FR LF (SET/KITS/TRAYS/PACK) ×1 IMPLANT
WATER STERILE IRR 1000ML POUR (IV SOLUTION) ×1 IMPLANT

## 2023-03-23 NOTE — Anesthesia Procedure Notes (Signed)
Spinal  Patient location during procedure: OR Start time: 03/23/2023 12:20 PM End time: 03/23/2023 12:25 PM Reason for block: surgical anesthesia Staffing Performed: anesthesiologist and other anesthesia staff  Anesthesiologist: Leonides Grills, MD Performed by: Leonides Grills, MD Authorized by: Leonides Grills, MD   Preanesthetic Checklist Completed: patient identified, IV checked, risks and benefits discussed, surgical consent, monitors and equipment checked, pre-op evaluation and timeout performed Spinal Block Patient position: sitting Prep: ChloraPrep Patient monitoring: cardiac monitor, continuous pulse ox and blood pressure Approach: midline Location: L3-4 Injection technique: single-shot Needle Needle type: Pencan  Needle gauge: 24 G Needle length: 9 cm Assessment Sensory level: T10 Events: CSF return Additional Notes Functioning IV was confirmed and monitors were applied. Sterile prep and drape, including hand hygiene and sterile gloves were used. The patient was positioned and the spine was prepped. The skin was anesthetized with lidocaine. Puncture of lumbar area tattoo ink avoided.  Free flow of clear CSF was obtained prior to injecting local anesthetic into the CSF.  The spinal needle aspirated freely following injection.  The needle was carefully withdrawn.  The patient tolerated the procedure well.

## 2023-03-23 NOTE — Anesthesia Preprocedure Evaluation (Addendum)
Anesthesia Evaluation    Reviewed: Allergy & Precautions, Patient's Chart, lab work & pertinent test results  Airway Mallampati: I  TM Distance: >3 FB Neck ROM: Full    Dental no notable dental hx.    Pulmonary neg pulmonary ROS   Pulmonary exam normal        Cardiovascular Normal cardiovascular exam     Neuro/Psych  Headaches PSYCHIATRIC DISORDERS Anxiety Depression       GI/Hepatic negative GI ROS, Neg liver ROS,,,  Endo/Other  negative endocrine ROS    Renal/GU negative Renal ROS     Musculoskeletal negative musculoskeletal ROS (+)    Abdominal  (+) + obese  Peds  Hematology  (+) Blood dyscrasia, anemia   Anesthesia Other Findings Repeat C-section x4  Reproductive/Obstetrics (+) Pregnancy                             Anesthesia Physical Anesthesia Plan  ASA: 2  Anesthesia Plan: Spinal   Post-op Pain Management:    Induction:   PONV Risk Score and Plan: 2 and Ondansetron, Dexamethasone and Treatment may vary due to age or medical condition  Airway Management Planned: Natural Airway  Additional Equipment:   Intra-op Plan:   Post-operative Plan:   Informed Consent: I have reviewed the patients History and Physical, chart, labs and discussed the procedure including the risks, benefits and alternatives for the proposed anesthesia with the patient or authorized representative who has indicated his/her understanding and acceptance.     Dental advisory given  Plan Discussed with: CRNA  Anesthesia Plan Comments:        Anesthesia Quick Evaluation

## 2023-03-23 NOTE — Plan of Care (Signed)
  Problem: Education: Goal: Knowledge of General Education information will improve Description: Including pain rating scale, medication(s)/side effects and non-pharmacologic comfort measures Outcome: Progressing   Problem: Health Behavior/Discharge Planning: Goal: Ability to manage health-related needs will improve Outcome: Progressing   Problem: Clinical Measurements: Goal: Ability to maintain clinical measurements within normal limits will improve Outcome: Progressing Goal: Will remain free from infection Outcome: Progressing Goal: Diagnostic test results will improve Outcome: Progressing Goal: Respiratory complications will improve Outcome: Progressing Goal: Cardiovascular complication will be avoided Outcome: Progressing   Problem: Activity: Goal: Risk for activity intolerance will decrease Outcome: Progressing   Problem: Nutrition: Goal: Adequate nutrition will be maintained Outcome: Progressing   Problem: Coping: Goal: Level of anxiety will decrease Outcome: Progressing   Problem: Elimination: Goal: Will not experience complications related to bowel motility Outcome: Progressing Goal: Will not experience complications related to urinary retention Outcome: Progressing   Problem: Pain Management: Goal: General experience of comfort will improve Outcome: Progressing   Problem: Safety: Goal: Ability to remain free from injury will improve Outcome: Progressing   Problem: Skin Integrity: Goal: Risk for impaired skin integrity will decrease Outcome: Progressing   Problem: Education: Goal: Knowledge of condition will improve Outcome: Progressing   Problem: Activity: Goal: Will verbalize the importance of balancing activity with adequate rest periods Outcome: Progressing Goal: Ability to tolerate increased activity will improve Outcome: Progressing   Problem: Coping: Goal: Ability to identify and utilize available resources and services will  improve Outcome: Progressing   Problem: Life Cycle: Goal: Chance of risk for complications during the postpartum period will decrease Outcome: Progressing   Problem: Role Relationship: Goal: Ability to demonstrate positive interaction with newborn will improve Outcome: Progressing   Problem: Skin Integrity: Goal: Demonstration of wound healing without infection will improve Outcome: Progressing

## 2023-03-23 NOTE — H&P (Signed)
Faculty Practice H&P  Monica [name] is a 27 y.o. female [phone] with IUP at [redacted]w[redacted]d presenting for elective repeat cesarean section. Pregnancy was been complicated by h/o gastric bypass, h/o preeclampsia.    Pt states she has been having no contractions, no vaginal bleeding, intact membranes, with normal fetal movement.     Prenatal Course Source of Care: [facility] with onset of care at 13weeks  Pregnancy complications or risks: Patient Active Problem List   Diagnosis Date Noted   Alpha thalassemia silent carrier 10/08/2022   History of gastric bypass 09/24/2022   Morbid obesity (HCC) 02/15/2020   History of pre-eclampsia in prior pregnancy, currently pregnant 11/10/2017   History of C-section 09/15/2017   Supervision of high risk pregnancy, antepartum 09/14/2017   She desires IUD for contraception.  She plans to breastfeed  Prenatal labs and studies: ABO, Rh: --/--/O POS (10/25 1049) Antibody: NEG (10/25 1049) Rubella: 2.19 (03/21 1031) RPR: NON REACTIVE (10/25 1044)  HBsAg: Negative (03/21 1031)  HIV: Non Reactive (08/13 1121)  GBS: Negative/-- (09/24 0951)  2hr Glucola: negative Genetic screening: normal Anatomy US: normal  Past Medical History:  Past Medical History:  Diagnosis Date   Anxiety    Chronic hypertension affecting pregnancy 12/08/2017   [x ] Aspirin 81 mg daily after 12 weeks  Current antihypertensives:  None      Baseline and surveillance labs (pulled in from [facility], refresh links as needed)       Lab Results  Component  Value  Date     PLT  332  09/08/2019     CREATININE  0.46 (L)  09/08/2019     AST  14  09/08/2019     ALT  9  09/08/2019     PROTCRRATIO  11.61 (H)  11/03/2012            UPC                                          Chronic hypertension affecting pregnancy 09/15/2017   Arly.Keller ] Aspirin 81 mg daily after 12 weeks; discontinue after 36 weeks  Current antihypertensives:  None      Baseline and surveillance labs (pulled in from [facility], refresh  links as needed)       Lab Results  Component  Value  Date     PLT  322  09/15/2017     CREATININE  0.42 (L)  09/15/2017     AST  13  09/15/2017     ALT  9  09/15/2017     PROTCRRATIO  11.61 (H)  11/03/2012        Antenatal Testing    Depression    H/O pre-eclampsia in prior pregnancy, currently pregnant 11/10/2017   First pregnancy   Headache    Hypertension    Pregnancy induced hypertension    Vaginal Pap smear, abnormal    Vitamin D deficiency 03/10/2022    Past Surgical History:  Past Surgical History:  Procedure Laterality Date   arthoscopic knee Right 2013   CESAREAN SECTION N/A 11/05/2012   Procedure: CESAREAN SECTION;  Surgeon: Antionette Char, MD;  Location: [facility] ORS;  Service: Obstetrics;  Laterality: N/A;   CESAREAN SECTION N/A 04/02/2018   Procedure: CESAREAN SECTION;  Surgeon: Lesly Dukes, MD;  Location: [facility] BIRTHING SUITES;  Service: Obstetrics;  Laterality: N/A;   CESAREAN SECTION N/A 03/06/2020  Procedure: CESAREAN SECTION;  Surgeon: Levie Heritage, DO;  Location: [facility] LD ORS;  Service: Obstetrics;  Laterality: N/A;   COLPOSCOPY  05/07/2016   LAPAROSCOPIC GASTRIC SLEEVE RESECTION  06/04/2022    Obstetrical History:  OB History     Gravida  5   Para  3   Term  3   Preterm      AB  1   Living  3      SAB      IAB  1   Ectopic      Multiple  0   Live Births  3           Gynecological History:  OB History     Gravida  5   Para  3   Term  3   Preterm      AB  1   Living  3      SAB      IAB  1   Ectopic      Multiple  0   Live Births  3           Social History:  Social History   Socioeconomic History   Marital status: Single    Spouse name: Not on file   Number of children: Not on file   Years of education: Not on file   Highest education level: Not on file  Occupational History   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  Tobacco Use   Smoking status: Never   Smokeless  tobacco: Never  Vaping Use   Vaping status: Former  Substance and Sexual Activity   Alcohol use: Not Currently    Comment: sometimes, not since confirmed preg   Drug use: Not Currently    Types: Marijuana    Comment: not since confirmed pregnancy   Sexual activity: Yes    Partners: Male    Birth control/protection: None  Other Topics Concern   Not on file  Social History Narrative   Not on file   Social Determinants of Health   Financial Resource Strain: Low Risk  (06/08/2022)   Received from [facility]   Overall Financial Resource Strain (CARDIA)    Difficulty of Paying Living Expenses: Not very hard  Food Insecurity: No Food Insecurity (03/23/2023)   Hunger Vital Sign    Worried About Running Out of Food in the Last Year: Never true    Ran Out of Food in the Last Year: Never true  Transportation Needs: No Transportation Needs (03/23/2023)   PRAPARE - Administrator, Civil Service (Medical): No    Lack of Transportation (Non-Medical): No  Physical Activity: Sufficiently Active (06/08/2022)   Received from [facility]   Exercise Vital Sign    Days of Exercise per Week: 4 days    Minutes of Exercise per Session: 40 min  Stress: No Stress Concern Present (06/08/2022)   Received from [facility], [facility] - Occupational Stress Questionnaire    Feeling of Stress : Only a little  Social Connections: Socially Integrated (06/08/2022)   Received from [facility], [facility]   Social Network    How would you rate your social network (family, work, friends)?: Good participation with social networks    Family History:  Family History  Problem Relation Age of Onset   Hypertension Mother    Hypertension Father    Healthy Sister  x1`   Healthy Brother        x1   Asthma Brother    Heart attack Maternal Grandmother    Hypertension Maternal Grandmother    Hearing loss  Maternal Grandmother    Kidney disease Maternal Grandmother    Intellectual disability Maternal Aunt    Cancer Neg Hx    Diabetes Neg Hx     Medications:  Prenatal vitamins,  Current Facility-Administered Medications  Medication Dose Route Frequency Provider Last Rate Last Admin   ceFAZolin (ANCEF) IVPB 2g/100 mL premix  2 g Intravenous On Call to OR Levie Heritage, DO       lactated ringers infusion   Intravenous Continuous Levie Heritage, DO 100 mL/hr at 03/23/23 1052 New Bag at 03/23/23 1052   levonorgestrel (MIRENA) 20 MCG/DAY IUD 1 each  1 each Intrauterine Once Gustabo Gordillo J, DO       povidone-iodine 10 % swab 2 Application  2 Application Topical Once Levie Heritage, DO        Allergies:  Allergies  Allergen Reactions   Iodine Rash   Shellfish Allergy Hives and Rash    Review of Systems: -  negative  Physical Exam: Blood pressure 111/87, pulse 83, temperature 98.5 F (36.9 C), temperature source Oral, height 5\' 2"  (1.575 m), weight 84.1 kg, last menstrual period 06/16/2022, SpO2 95%. GENERAL: Well-developed, well-nourished female in no acute distress.  LUNGS: Clear to auscultation bilaterally.  HEART: Regular rate and rhythm. ABDOMEN: Soft, nontender, nondistended, gravid. EXTREMITIES: Nontender, no edema, 2+ distal pulses. FHT:  Baseline rate 130 bpm      Pertinent Labs/Studies:   Lab Results  Component Value Date   WBC 8.4 03/20/2023   HGB 10.5 (L) 03/20/2023   HCT 32.8 (L) 03/20/2023   MCV 77.7 (L) 03/20/2023   PLT 248 03/20/2023    Assessment : Monica [name] is a 27 y.o. [id number] at [redacted]w[redacted]d being admitted for cesarean section secondary to elective repeat with insertion of mirena IUD.  Plan: The risks of cesarean section discussed with the patient included but were not limited to: bleeding which may require transfusion or reoperation; infection which may require antibiotics; injury to bowel, bladder, ureters or other surrounding organs;  injury to the fetus; need for additional procedures including hysterectomy in the event of a life-threatening hemorrhage; placental abnormalities wth subsequent pregnancies, incisional problems, thromboembolic phenomenon and other postoperative/anesthesia complications. The patient concurred with the proposed plan, giving informed written consent for the procedure.   Patient has been NPO since last night and will remain NPO for procedure.  Preoperative prophylactic Ancef ordered on call to the OR.   Post placental IUD will be placed. Will attempt to breastfeed. TXA intraoperatively as high PPH risk.    Levie Heritage, DO 03/23/2023, 11:07 AM

## 2023-03-23 NOTE — Lactation Note (Signed)
This note was copied from a baby's chart. Lactation Consultation Note  Patient Name: Monica [name] [id number] Date: 03/23/2023 Age:28 hours Reason for consult: Initial assessment;Term.See MOB- MR: hx gastric sleeve, pre-eclampsia and C/S delivery.  P4, term female infant, FOB changed void diaper while LC was in the room, LC discussed infant's input and output with Parents. LC entered the room, infant pacifier in his mouth and when removed he was cuing to BF. MOB informed LC infant recently BF for 30 minutes less than 1 hour prior to [facility] entering the room. LC suggested to wait 3 weeks before offering pacifier. Mob open to trying the football hold position, MOB latched infant on her right breast using the football hold, infant was on and off the breast for 5 minutes, LC did not observe swallows with this latch. MOB concern if she has colostrum, LC used breast model and MOB self expressed few drops that was finger feed to infant. MOB was doing skin to skin with infant when LC left the room. MOB was made aware of O/P services, breastfeeding support groups, community resources, and our phone # for post-discharge questions.     Today's Current feeding plan: 1- MOB will continue to BF infant by cues, on demand, skin to skin every 2-3 hours. 2- MOB knows to call for latch assistance if needed. 3- MOB will have balance meals, snacks and stay hydrated. 4-MOB will delay pacifier use until 3 weeks postpartum.   Maternal Data Has patient been taught Hand Expression?: Yes Does the patient have breastfeeding experience prior to this delivery?: Yes How long did the patient breastfeed?: Per MOB, did not BF 1st child, attempted with 2nd had latch difficulties and BF 3rd child for 6 weeks who is currently 50 years old.  Feeding Mother's Current Feeding Choice: Breast Milk  LATCH Score Latch: Repeated attempts needed to sustain latch, nipple held in mouth throughout feeding, stimulation needed to elicit sucking  reflex.  Audible Swallowing: None  Type of Nipple: Everted at rest and after stimulation  Comfort (Breast/Nipple): Soft / non-tender  Hold (Positioning): Assistance needed to correctly position infant at breast and maintain latch.  LATCH Score: 6   Lactation Tools Discussed/Used    Interventions Interventions: Breast feeding basics reviewed;Assisted with latch;Skin to skin;Hand express;Breast compression;Adjust position;Support pillows;Position options;Education;LC Services brochure  Discharge Pump: DEBP;Personal  Consult Status Consult Status: Follow-up Date: 03/24/23 Follow-up type: In-patient    Frederico Hamman 03/23/2023, 5:59 PM

## 2023-03-23 NOTE — Transfer of Care (Signed)
Immediate Anesthesia Transfer of Care Note  Patient: Monica [name]  Procedure(s) Performed: CESAREAN SECTION  Patient Location: PACU  Anesthesia Type:Spinal  Level of Consciousness: awake, alert , and oriented  Airway & Oxygen Therapy: Patient Spontanous Breathing  Post-op Assessment: Report given to RN and Post -op Vital signs reviewed and stable  Post vital signs: Reviewed and stable  Last Vitals:  Vitals Value Taken Time  BP 96/57 03/23/23 1351  Temp    Pulse 61 03/23/23 1354  Resp 15 03/23/23 1354  SpO2 99 % 03/23/23 1354  Vitals shown include unfiled device data.  Last Pain:  Vitals:   03/23/23 1056  TempSrc: Oral  PainSc:          Complications: No notable events documented.

## 2023-03-23 NOTE — Discharge Summary (Signed)
Postpartum Discharge Summary  Date of Service updated***     Patient Name: Monica [name] DOB: 01-17-1996 MRN: [id number]  Date of admission: 03/23/2023 Delivery date:03/23/2023 Delivering provider: Levie Heritage Date of discharge: 03/23/2023  Admitting diagnosis: Repeat C-section x4 Intrauterine pregnancy: [redacted]w[redacted]d     Secondary diagnosis:  Principal Problem:   S/P cesarean section  Additional problems: ***    Discharge diagnosis: Term Pregnancy Delivered and h/o gastric bypass surgery                                               Post partum procedures:{Postpartum procedures:23558} Augmentation: N/A Complications: {OB Labor/Delivery Complications:20784}  Hospital course: Scheduled C/S   27 y.o. yo [id number] at [redacted]w[redacted]d was admitted to the hospital 03/23/2023 for scheduled cesarean section with the following indication:Elective Repeat.Delivery details are as follows:  Membrane Rupture Time/Date: 12:54 PM,03/23/2023  Delivery Method:C-Section, Low Transverse Operative Delivery:N/A Details of operation can be found in separate operative note.  Patient had a postpartum course complicated by***.  She is ambulating, tolerating a regular diet, passing flatus, and urinating well. Patient is discharged home in stable condition on  03/23/23        Newborn Data: Birth date:03/23/2023 Birth time:12:55 PM Gender:Female Living status:Living Apgars:9 ,9  Weight:3320 g    Magnesium Sulfate received: {Mag received:30440022} BMZ received: No Rhophylac:N/A MMR:N/A T-DaP:{Tdap:23962} Flu: No RSV Vaccine received: {RSV:31013} Transfusion:{Transfusion received:30440034}  Immunizations received: Immunization History  Administered Date(s) Administered   HPV Quadrivalent 04/06/2008, 10/19/2008, 02/22/2009   Tdap 11/06/2012, 01/12/2018    Physical exam  Vitals:   03/23/23 1056 03/23/23 1352 03/23/23 1400 03/23/23 1415  BP: 111/87 (!) 96/57 98/60   Pulse: 83 (!) 59 66 60   Resp:   16 19  Temp: 98.5 F (36.9 C) 97.7 F (36.5 C)    TempSrc: Oral Oral    SpO2: 95% 99% 100% 98%  Weight:      Height:       General: {Exam; general:21111117} Lochia: {Desc; appropriate/inappropriate:30686::"appropriate"} Uterine Fundus: {Desc; firm/soft:30687} Incision: {Exam; incision:21111123} DVT Evaluation: {Exam; dvt:2111122} Labs: Lab Results  Component Value Date   WBC 8.4 03/20/2023   HGB 10.5 (L) 03/20/2023   HCT 32.8 (L) 03/20/2023   MCV 77.7 (L) 03/20/2023   PLT 248 03/20/2023      Latest Ref Rng & Units 03/13/2022    5:20 PM  CMP  Glucose 70 - 99 mg/dL 98   BUN 6 - 20 mg/dL 10   Creatinine 7.84 - 1.00 mg/dL 6.96   Sodium 295 - 284 mmol/L 139   Potassium 3.5 - 5.1 mmol/L 4.6   Chloride 98 - 111 mmol/L 110   CO2 22 - 32 mmol/L 23   Calcium 8.9 - 10.3 mg/dL 9.1   Total Protein 6.5 - 8.1 g/dL 7.8   Total Bilirubin 0.3 - 1.2 mg/dL 0.8   Alkaline Phos 38 - 126 U/L 41   AST 15 - 41 U/L 23   ALT 0 - 44 U/L 23    Edinburgh Score:    04/04/2020    3:09 PM  Edinburgh Postnatal Depression Scale Screening Tool  I have been able to laugh and see the funny side of things. 0  I have looked forward with enjoyment to things. 0  I have blamed myself unnecessarily when things went wrong. 0  I have  been anxious or worried for no good reason. 0  I have felt scared or panicky for no good reason. 0  Things have been getting on top of me. 0  I have been so unhappy that I have had difficulty sleeping. 0  I have felt sad or miserable. 0  I have been so unhappy that I have been crying. 0  The thought of harming myself has occurred to me. 0  Edinburgh Postnatal Depression Scale Total 0   No data recorded  After visit meds:  Allergies as of 03/23/2023       Reactions   Iodine Rash   Shellfish Allergy Hives, Rash     Med Rec must be completed prior to using this [facility]***        Discharge home in stable condition Infant Feeding: {Baby  feeding:23562} Infant Disposition:{CHL IP OB HOME WITH ZOXWRU:04540} Discharge instruction: per After Visit Summary and Postpartum booklet. Activity: Advance as tolerated. Pelvic rest for 6 weeks.  Diet: {OB JWJX:91478295} Future Appointments: Future Appointments  Date Time Provider Department Center  03/31/2023 10:00 AM [facility] NURSE [facility] None  05/06/2023 10:15 AM Gerrit Heck, CNM [facility] None   Follow up Visit: Message sent to [facility] 03/23/23  Please schedule this patient for a In person postpartum visit in 4 weeks with the following provider: Any provider. Additional Postpartum F/U:Incision check 1 week  High risk pregnancy complicated by:  h/o gastric bypass, h/o cesarean delivery x 3 prior, morbid obesity Delivery mode:  C-Section, Low Transverse Anticipated Birth Control:  PP IUD placed   03/23/2023 Sundra Aland, MD

## 2023-03-23 NOTE — Op Note (Addendum)
Lachele Ngoctran Doan Finelli PROCEDURE DATE: 03/23/2023  PREOPERATIVE DIAGNOSIS: Intrauterine pregnancy at  [redacted]w[redacted]d weeks gestation;  h/o cesarean x 3, h/o gastric bypass, h/o preeclampsia  POSTOPERATIVE DIAGNOSIS: The same  PROCEDURE: Repeat Low Transverse Cesarean Section  SURGEON:  Dr. Candelaria Celeste  ASSISTANT: Dr. Liborio Negron Torres Bing, Sundra Aland, MD  INDICATIONS: Monica [name] is a 27 y.o. [id number] at [redacted]w[redacted]d scheduled for cesarean section secondary to  h/o cesarean delivery x 3 .  The risks of cesarean section discussed with the patient included but were not limited to: bleeding which may require transfusion or reoperation; infection which may require antibiotics; injury to bowel, bladder, ureters or other surrounding organs; injury to the fetus; need for additional procedures including hysterectomy in the event of a life-threatening hemorrhage; placental abnormalities wth subsequent pregnancies, incisional problems, thromboembolic phenomenon and other postoperative/anesthesia complications. The patient concurred with the proposed plan, giving informed written consent for the procedure.    FINDINGS:  Viable female infant in cephalic presentation.  Apgars 9 and 9, weight, 3320 grams.  Clear amniotic fluid.  Intact placenta, three vessel cord.  Normal uterus, fallopian tubes and ovaries bilaterally. Very thin lower uterine segment with uterine window of left lower uterine segment.  Mild adhesive disease.  ANESTHESIA:    Spinal INTRAVENOUS FLUIDS:1300 ml ESTIMATED BLOOD LOSS: 560 ml URINE OUTPUT:  150 ml SPECIMENS: Placenta sent to L&D COMPLICATIONS: None immediate  PROCEDURE IN DETAIL:  The patient received intravenous antibiotics and had sequential compression devices applied to her lower extremities while in the preoperative area.  She was then taken to the operating room where spinal anesthesia was administered and was found to be adequate. She was then placed in a dorsal supine  position with a leftward tilt, and prepped and draped in a sterile manner.  A foley catheter was placed into her bladder and attached to constant gravity, which drained clear fluid throughout.  After an adequate timeout was performed, a Pfannenstiel skin incision was made with scalpel and carried through to the underlying layer of fascia. The fascia was incised in the midline and this incision was extended bilaterally using the Mayo scissors. Kocher clamps were applied to the superior aspect of the fascial incision and the underlying rectus muscles were dissected off bluntly. A similar process was carried out on the inferior aspect of the facial incision. The rectus muscles were separated in the midline bluntly and the peritoneum was entered bluntly. An Alexis retractor was placed to aid in visualization of the uterus.  Attention was turned to the lower uterine segment where a transverse hysterotomy was made with a scalpel and extended bilaterally bluntly. The infant was successfully delivered, and cord was clamped and cut and infant was handed over to awaiting neonatology team. Uterine massage was then administered and the placenta delivered intact with three-vessel cord. The uterus was then cleared of clot and debris.  The hysterotomy was closed with 0 Vicryl in a running locked fashion. Prior to complete closure of the hysterotomy, a Mirena IUD was then removed from it's packaging in a sterile manner and the strings were trimmed to approximately 10 cm. The IUD was placed manually at the uterine fundus and the strings were passed through the cervical os with a Kelly clamp. The remainder of the hysterotomy was then closed. Overall, excellent hemostasis was noted. The abdomen and the pelvis were cleared of all clot and debris and the Jon Gills was removed. Hemostasis was confirmed on all surfaces.  The peritoneum was reapproximated using  2-0 vicryl with a purse-string suture. The fascia was then closed using 0 Vicryl  in a running fashion. The subcutaneous layer was reapproximated with plain gut and the skin was closed with 4-0 vicryl. The patient tolerated the procedure well. Sponge, lap, instrument and needle counts were correct x 2. She was taken to the recovery room in stable condition.    Sundra Aland, MD OB Fellow, Faculty [facility], Center for [facility]  03/23/2023 2:05 PM

## 2023-03-23 NOTE — Anesthesia Postprocedure Evaluation (Signed)
Anesthesia Post Note  Patient: Monica [name]  Procedure(s) Performed: CESAREAN SECTION     Patient location during evaluation: PACU Anesthesia Type: Spinal Level of consciousness: awake Pain management: pain level controlled Vital Signs Assessment: post-procedure vital signs reviewed and stable Respiratory status: spontaneous breathing, nonlabored ventilation and respiratory function stable Cardiovascular status: blood pressure returned to baseline and stable Postop Assessment: no apparent nausea or vomiting Anesthetic complications: no   No notable events documented.  Last Vitals:  Vitals:   03/23/23 1445 03/23/23 1505  BP: 105/64 103/62  Pulse: (!) 56 (!) 54  Resp: 19 17  Temp: (!) 36.3 C (!) 36.4 C  SpO2: 98% 99%    Last Pain:  Vitals:   03/23/23 1505  TempSrc: Oral  PainSc: 0-No pain   Pain Goal:    LLE Motor Response: Purposeful movement (03/23/23 1445) LLE Sensation: Tingling (03/23/23 1445) RLE Motor Response: Purposeful movement (03/23/23 1445) RLE Sensation: Tingling (03/23/23 1445) L Sensory Level: T10-Umbilical region (03/23/23 1445) R Sensory Level: T10-Umbilical region (03/23/23 1445) Epidural/Spinal Function Cutaneous sensation: Able to Discern Pressure (03/23/23 1505), Patient able to flex knees: Yes (03/23/23 1505), Patient able to lift hips off bed: No (03/23/23 1505), Back pain beyond tenderness at insertion site: No (03/23/23 1505), Progressively worsening motor and/or sensory loss: No (03/23/23 1505), Bowel and/or bladder incontinence post epidural: No (03/23/23 1505)  [name] P Mert Dietrick

## 2023-03-24 LAB — CBC
HCT: 24.9 % — ABNORMAL LOW (ref 36.0–46.0)
Hemoglobin: 8.2 g/dL — ABNORMAL LOW (ref 12.0–15.0)
MCH: 25.4 pg — ABNORMAL LOW (ref 26.0–34.0)
MCHC: 32.9 g/dL (ref 30.0–36.0)
MCV: 77.1 fL — ABNORMAL LOW (ref 80.0–100.0)
Platelets: 227 10*3/uL (ref 150–400)
RBC: 3.23 MIL/uL — ABNORMAL LOW (ref 3.87–5.11)
RDW: 12.9 % (ref 11.5–15.5)
WBC: 9.4 10*3/uL (ref 4.0–10.5)
nRBC: 0 % (ref 0.0–0.2)

## 2023-03-24 MED ORDER — FERROUS FUMARATE 324 (106 FE) MG PO TABS
1.0000 | ORAL_TABLET | ORAL | Status: DC
  Administered 2023-03-24: 106 mg via ORAL
  Filled 2023-03-24: qty 1

## 2023-03-24 NOTE — Social Work (Signed)
MOB was referred for history of depression/anxiety.  * Referral screened out by Clinical Social Worker because none of the following criteria appear to apply:  ~ History of anxiety/depression during this pregnancy, or of post-partum depression following prior delivery.  ~ Diagnosis of anxiety and/or depression within last 3 years OR * MOB's symptoms currently being treated with medication and/or therapy.  Per chart review MOB's diagnosis dates back to 2014. No concerns noted during his pregnancy.  Please contact the Clinical Social Worker if needs arise, or by MOB request.   Wende Neighbors, LCSWA Clinical Social Worker [phone]

## 2023-03-24 NOTE — Plan of Care (Signed)
  Problem: Education: Goal: Knowledge of General Education information will improve Description: Including pain rating scale, medication(s)/side effects and non-pharmacologic comfort measures Outcome: Progressing   Problem: Health Behavior/Discharge Planning: Goal: Ability to manage health-related needs will improve Outcome: Progressing   Problem: Clinical Measurements: Goal: Ability to maintain clinical measurements within normal limits will improve Outcome: Progressing Goal: Will remain free from infection Outcome: Progressing Goal: Diagnostic test results will improve Outcome: Progressing Goal: Respiratory complications will improve Outcome: Progressing Goal: Cardiovascular complication will be avoided Outcome: Progressing   Problem: Activity: Goal: Risk for activity intolerance will decrease Outcome: Progressing   Problem: Nutrition: Goal: Adequate nutrition will be maintained Outcome: Progressing   Problem: Coping: Goal: Level of anxiety will decrease Outcome: Progressing   Problem: Elimination: Goal: Will not experience complications related to bowel motility Outcome: Progressing Goal: Will not experience complications related to urinary retention Outcome: Progressing   Problem: Pain Management: Goal: General experience of comfort will improve Outcome: Progressing   Problem: Safety: Goal: Ability to remain free from injury will improve Outcome: Progressing   Problem: Skin Integrity: Goal: Risk for impaired skin integrity will decrease Outcome: Progressing   Problem: Education: Goal: Knowledge of condition will improve Outcome: Progressing   Problem: Activity: Goal: Will verbalize the importance of balancing activity with adequate rest periods Outcome: Progressing Goal: Ability to tolerate increased activity will improve Outcome: Progressing   Problem: Coping: Goal: Ability to identify and utilize available resources and services will  improve Outcome: Progressing   Problem: Life Cycle: Goal: Chance of risk for complications during the postpartum period will decrease Outcome: Progressing   Problem: Role Relationship: Goal: Ability to demonstrate positive interaction with newborn will improve Outcome: Progressing   Problem: Skin Integrity: Goal: Demonstration of wound healing without infection will improve Outcome: Progressing   Problem: Education: Goal: Knowledge of the prescribed therapeutic regimen will improve Outcome: Progressing Goal: Understanding of sexual limitations or changes related to disease process or condition will improve Outcome: Progressing Goal: Individualized Educational Video(s) Outcome: Progressing   Problem: Self-Concept: Goal: Communication of feelings regarding changes in body function or appearance will improve Outcome: Progressing   Problem: Skin Integrity: Goal: Demonstration of wound healing without infection will improve Outcome: Progressing

## 2023-03-24 NOTE — Progress Notes (Signed)
Subjective: Postpartum Day #1: Cesarean Delivery Patient reports tolerating PO; denies dizziness; foley cath has been removed but just recently and so she hasn't voided yet; breastfeeding going okay; she desires a circumcision for her son> parents were consented a note was placed in the baby's chart    Objective: Vital signs in last 24 hours: Temp:  [97.3 F (36.3 C)-98.5 F (36.9 C)] 97.7 F (36.5 C) (10/29 0607) Pulse Rate:  [50-83] 73 (10/29 0607) Resp:  [15-19] 18 (10/29 0607) BP: (92-117)/(50-87) 94/67 (10/29 0607) SpO2:  [95 %-100 %] 100 % (10/29 1610)  Physical Exam:  General: cooperative, fatigued, and no distress Lochia: appropriate Uterine Fundus: firm Incision: honeycomb intact and dry DVT Evaluation: No evidence of DVT seen on physical exam.  Recent Labs    03/24/23 0548  HGB 8.2*  HCT 24.9*    Assessment/Plan: Status post Cesarean section. Doing well postoperatively.  Continue current care. Begin every other day Fe due to acute blood loss anemia. Anticipate d/c 03/25/23.  Monica [name], CNM 03/24/2023, 10:48 AM

## 2023-03-25 DIAGNOSIS — O900 Disruption of cesarean delivery wound: Secondary | ICD-10-CM | POA: Insufficient documentation

## 2023-03-25 MED ORDER — POLYETHYLENE GLYCOL 3350 17 GM/SCOOP PO POWD: 17.0000 g | Freq: Every day | ORAL | 1 refills | Status: AC | PRN

## 2023-03-25 MED ORDER — ACETAMINOPHEN 500 MG PO TABS: 1000.0000 mg | ORAL_TABLET | Freq: Four times a day (QID) | ORAL | 0 refills | Status: AC

## 2023-03-25 MED ORDER — OXYCODONE HCL 5 MG PO TABS: 5.0000 mg | ORAL_TABLET | Freq: Four times a day (QID) | ORAL | 0 refills | Status: AC | PRN

## 2023-03-25 MED ORDER — FERROUS FUMARATE 324 (106 FE) MG PO TABS: 1.0000 | ORAL_TABLET | ORAL | 1 refills | Status: AC

## 2023-03-25 MED ORDER — IBUPROFEN 600 MG PO TABS: 600.0000 mg | ORAL_TABLET | Freq: Four times a day (QID) | ORAL | 0 refills | Status: DC

## 2023-03-25 NOTE — Lactation Note (Signed)
This note was copied from a baby's chart. Lactation Consultation Note  Patient Name: Monica [name] ZOXWR'U Date: 03/25/2023 Age:28 hours Reason for consult: Follow-up assessment  P4, LC rounds. Mom preparing for discharge with LC entrance. Discussed working with baby at breast to bring milk in. Mom's fourth baby, praised experience at feeding. Handout on breast milk storage shared with mom.     Maternal Data    Feeding Mother's Current Feeding Choice: Breast Milk and Formula   Interventions Interventions: Breast feeding basics reviewed;Education  Discharge Discharge Education: Engorgement and breast care  Consult Status Consult Status: Complete Date: 03/25/23    Idamae Lusher RN IBCLC 03/25/2023, 2:53 PM

## 2023-03-25 NOTE — Progress Notes (Signed)
POSTPARTUM PROGRESS NOTE  POD #2  Subjective:  Sibbie Ngoctran Doan Kulig is a 27 y.o. [id number] s/p rLTCS at [redacted]w[redacted]d. No acute events overnight. She reports she is doing well. She denies any problems with ambulating, voiding or po intake. Denies nausea or vomiting. She has passed flatus. Pain is well controlled.  Lochia is minimal.  Objective: Blood pressure (!) 93/59, pulse 62, temperature 98.4 F (36.9 C), temperature source Oral, resp. rate 18, height 5\' 2"  (1.575 m), weight 84.1 kg, last menstrual period 06/16/2022, SpO2 100%, unknown if currently breastfeeding.  Physical Exam:  General: alert, cooperative and no distress Chest: no respiratory distress Heart: regular rate, distal pulses intact Uterine Fundus: firm, appropriately tender DVT Evaluation: No calf swelling or tenderness Extremities: Minimal LE edema Skin: warm, dry; honeycomb dressing in dry and in place  Recent Labs    03/24/23 0548  HGB 8.2*  HCT 24.9*    Assessment/Plan: Ashonti Ngoctran Doan Ritsema is a 27 y.o. [id number] s/p rLTCS at [redacted]w[redacted]d for hx of C/S x4.  POD#2 - Doing welll; pain well controlled. H/H low  Routine postpartum care  OOB, ambulated  Lovenox for VTE prophylaxis Microcytic Anemia: asymptomatic  Start po ferrous Fumarate every other day  Contraception: PP IUD Feeding: breast  Dispo: Plan for discharge tomorrow.   LOS: 2 days   Brien Mates, Student-PA OB Fellow  03/25/2023, 7:52 AM

## 2023-03-26 ENCOUNTER — Encounter (HOSPITAL_COMMUNITY): Payer: Self-pay | Admitting: Family Medicine

## 2023-03-30 ENCOUNTER — Encounter: Payer: Medicaid Other | Admitting: Obstetrics and Gynecology

## 2023-03-31 ENCOUNTER — Ambulatory Visit: Payer: Medicaid Other | Admitting: Emergency Medicine

## 2023-03-31 VITALS — BP 102/68 | HR 87 | Wt 167.4 lb

## 2023-03-31 DIAGNOSIS — Z0130 Encounter for examination of blood pressure without abnormal findings: Secondary | ICD-10-CM

## 2023-03-31 DIAGNOSIS — Z4889 Encounter for other specified surgical aftercare: Secondary | ICD-10-CM

## 2023-03-31 NOTE — Progress Notes (Signed)
Pt presents for PP BP check and wound check. Repeat c-sesarean on 10/28.   Pain controlled with Tylenol and Ibuprofen.  BP stable in office today 102/68 P 87. Pt reports severe headaches at home, but denies visual disturbance, swelling, or epigastric pain. States that Tylenol or Ibuprofen doesn't help with relief but sleep does. Reports that BP readings at home with headache episodes are normal.  Pt advised to continue to monitor BP at home, particularly when she has headaches. MAU/ED precautions given.  Incision clean, dry, intact. No evidence of redness, erythema, drainage, bleeding, odor, or dehiscence or fever. Pt counseled on hygiene, wound care. MAU precautions given.

## 2023-04-14 ENCOUNTER — Encounter: Payer: Self-pay | Admitting: Advanced Practice Midwife

## 2023-04-15 ENCOUNTER — Ambulatory Visit: Payer: Medicaid Other | Admitting: Obstetrics and Gynecology

## 2023-04-15 ENCOUNTER — Encounter: Payer: Self-pay | Admitting: Obstetrics and Gynecology

## 2023-04-15 DIAGNOSIS — N939 Abnormal uterine and vaginal bleeding, unspecified: Secondary | ICD-10-CM

## 2023-04-15 DIAGNOSIS — F439 Reaction to severe stress, unspecified: Secondary | ICD-10-CM | POA: Diagnosis not present

## 2023-04-15 DIAGNOSIS — Z9889 Other specified postprocedural states: Secondary | ICD-10-CM | POA: Diagnosis not present

## 2023-04-15 NOTE — Progress Notes (Signed)
Post Partum Visit Note  Monica [name] is a 27 y.o. [phone] female who presents for a postpartum visit. She is 3 weeks postpartum following a repeat cesarean section.  I have fully reviewed the prenatal and intrapartum course. The delivery was at 39 gestational weeks.  Anesthesia: spinal. Postpartum course has been stressful. Baby is doing well yes. Baby is feeding by both breast and bottle - similac 360 . Bleeding brown. Bowel function is normal. Bladder function is normal. Patient is not sexually active. Contraception method is IUD. Postpartum depression screening: negative.  Patient felt strings yesterday and felt some pain with strings poking her. Wasn't bleeding last week, started bleeding again this week. Brownish red.    The pregnancy intention screening data noted above was reviewed. Potential methods of contraception were discussed. The patient elected to proceed with No data recorded.   Edinburgh Postnatal Depression Scale - 04/15/23 1141       Edinburgh Postnatal Depression Scale:  In the Past 7 Days   I have been able to laugh and see the funny side of things. 0    I have looked forward with enjoyment to things. 0    I have been anxious or worried for no good reason. 0    I have felt scared or panicky for no good reason. 0    Things have been getting on top of me. 2    I have been so unhappy that I have had difficulty sleeping. 0    I have felt sad or miserable. 3    I have been so unhappy that I have been crying. 1    The thought of harming myself has occurred to me. 0             Health Maintenance Due  Topic Date Due   COVID-19 Vaccine (1) Never done   INFLUENZA VACCINE  Never done    The following portions of the patient's history were reviewed and updated as appropriate: allergies, current medications, past family history, past medical history, past social history, past surgical history, and problem list.  Review of Systems Pertinent items are  noted in HPI.  Objective:  BP 111/62   Pulse 77   Ht 5\' 2"  (1.575 m)   Wt 169 lb 12.8 oz (77 kg)   LMP 06/16/2022 Comment: IUD  Breastfeeding Yes   BMI 31.06 kg/m    General:  alert, cooperative, and appears stated age   Breasts:  not indicated  Lungs: Normal respiration noted  Heart:  Normal heart rate noted  Abdomen: Soft, non-tender, well approximated pfannenstiel healing well    Wound well approximated incision  GU exam:   IUD strings protruding from os, trimmed to 3 cm, small amount placental tissue and clot protruding from external os, easily expressed with a foxswab and evacuated       Assessment:   1. Postpartum state  2. Postoperative state  3. Stress Feels she is coping well, offered behavioral health, declined, she will call if she needs help  4. Breast feeding status of mother Feels she is not emptying well due to elastic nipples, gave advice on flanges and will have her follow up with lactation - Ambulatory referral to Lactation  5. Vaginal bleeding Pulled small amount placental tissue/clot from os  Return 2 weeks for follow up   normal postpartum exam.   Plan:   Essential components of care per ACOG recommendations:  1.  Mood and well being: Patient with  negative depression screening today. Reviewed local resources for support.  - Patient tobacco use? No.   - hx of drug use? No.    2. Infant care and feeding:  -Patient currently breastmilk feeding? Yes. Reviewed importance of draining breast regularly to support lactation.  -Social determinants of health (SDOH) reviewed in EPIC. No concerns  3. Sexuality, contraception and birth spacing - Patient does not want a pregnancy in the next year.  - Reviewed reproductive life planning. Reviewed contraceptive methods based on pt preferences and effectiveness.  Patient desired IUD or IUS today.   - Discussed birth spacing of 18 months  4. Sleep and fatigue -Encouraged family/partner/community support of  4 hrs of uninterrupted sleep to help with mood and fatigue  5. Physical Recovery  - Discussed patients delivery and complications.  - Patient had a C-section. Patient had a Perineal healing reviewed. Patient expressed understanding - Patient has urinary incontinence? No. - Patient is not safe to resume physical and sexual activity  6.  Health Maintenance - HM due items addressed Yes - Last pap smear  Diagnosis  Date Value Ref Range Status  05/14/2022      - Negative for intraepithelial lesion or malignancy (NILM)   Pap smear not done at today's visit.  -Breast Cancer screening indicated? No.   7. Chronic Disease/Pregnancy Condition follow up: Hypertension  - PCP follow up  Conan Bowens, MD Center for [facility], [facility]

## 2023-05-06 ENCOUNTER — Ambulatory Visit (INDEPENDENT_AMBULATORY_CARE_PROVIDER_SITE_OTHER): Payer: Medicaid Other

## 2023-05-06 VITALS — BP 106/65 | HR 65 | Ht 62.0 in | Wt 169.0 lb

## 2023-05-06 DIAGNOSIS — N921 Excessive and frequent menstruation with irregular cycle: Secondary | ICD-10-CM | POA: Diagnosis not present

## 2023-05-06 DIAGNOSIS — Z975 Presence of (intrauterine) contraceptive device: Secondary | ICD-10-CM

## 2023-05-06 DIAGNOSIS — K5909 Other constipation: Secondary | ICD-10-CM

## 2023-05-06 DIAGNOSIS — Z30431 Encounter for routine checking of intrauterine contraceptive device: Secondary | ICD-10-CM

## 2023-05-06 MED ORDER — IBUPROFEN 800 MG PO TABS: 800.0000 mg | ORAL_TABLET | Freq: Three times a day (TID) | ORAL | 1 refills | Status: AC

## 2023-05-06 NOTE — Progress Notes (Signed)
Pt presents for IUD check. Pt reports bleeding since delivery. Pt reports discomfort from partner with intercourse

## 2023-05-06 NOTE — Progress Notes (Signed)
    GYNECOLOGY OFFICE ENCOUNTER NOTE  History:  Monica [name] is  a 27 y.o. [id number] here today for today for IUD string check; Mirena  IUD was placed post placentally on 03/23/2023. Patient reports she has had bleeding since delivery and expresses concern with cramping.  She also reports discomfort during intercourse, but has not engaged since and is unsure if it would be improved with position change. She endorses some constipation, but contributes it to not eating properly. Reports last bm was this morning.   The following portions of the patient's history were reviewed and updated as appropriate: allergies, current medications, past family history, past medical history, past social history, past surgical history and problem list. Last pap smear on 05/14/2022 was negative.  Review of Systems:  Pertinent items are noted in HPI.   Objective:  Physical Exam Blood pressure 106/65, pulse 65, height 5\' 2"  (1.575 m), weight 169 lb (76.7 kg), last menstrual period 06/16/2022, currently breastfeeding. CONSTITUTIONAL: Well-developed, well-nourished female in no acute distress.  NEUROLOGIC: Alert and oriented to person, place, and time. Normal reflexes, muscle tone coordination.  PSYCHIATRIC: Normal mood and affect. Normal behavior. Normal judgment and thought content. CARDIOVASCULAR: Normal heart rate noted RESPIRATORY: Effort and breath sounds normal, no problems with respiration noted ABDOMEN: Soft, no distention noted.   PELVIC: Normal appearing external genitalia; normal appearing vaginal mucosa and cervix.  Scant amt blood in vault removed with faux swab x 1. IUD strings visualized, about 4 cm in length outside cervix. Strings trimmed to 2cm and remnants removed with faux swab. Done in the presence of a chaperone-Ashley, Charity fundraiser.   Assessment & Plan:  1. Encounter for routine checking of intrauterine contraceptive device -IUD in place -Strings trimmed -Encouraged usage of lubrication  to promote increased comfort during sex.   2. Breakthrough bleeding with IUD -Informed that bleeding likely from IUD. -Discussed various medications to assist in cessation including ibuprofen, TXA, and oral contraception. -After exam ibuprofen seems least invasive.  Will do 10 day course and patient to send mychart message if no approval.   3. Other constipation -Discussed increasing fiber and water in diet.     Cherre Robins MSN, CNM Advanced Practice Provider, [facility] for [employer]

## 2023-06-04 NOTE — Progress Notes (Signed)
 Subjective   HPI:  Patient is here for visit for follow up and treatment of chronic metabolic issues. She is  1 year status post  SG performed. Weight loss from time of surgery has been 61 lbs.  Past year's visits were reviewed with nutrition, as well any admissions or ER visits. We discussed her long term weight loss goals and the necessary components of hitting protein target every single day, regular weight training exercise, and taking nutritional supplements and their key roles in achieving sustainable success.  Recent labs were reviewed as below with special attention to chronic abnormalities.   Follow-Up Questions:  Comorbidity List After Surgery: Wn:Ipjazuzd Wn:Pwdlopw No:Non-Insulin Wn:HZMI requiring medicine  Wn:Ybezmopepizfpj  Wn:Ybezmuzwdpnw # or Medications ________ Wn:Dozze Apnea   she denies any problems with:  nausea or vomiting no, melena or bloody stools no, diarrhea no, constipation no, dysphagia no, excessive weight loss no.   Please see changes in medications compared to preop.  She is/is not  taking below listed supplements as prescribed in the manual.   PPI- not taking  Citrucel- no  MVI -no   Actigall- no  Pepto Bismol -no  Calcium Citrate -no  Vitamin D -no  Is taking a daily iron supplement   she  is not doing resistance training. We discussed short and long term importance of regular exercise to maximize the metabolic benefits of the operation and lifestyle changes, as well as to prevent or minimize any muscle wasting. In particular, we talked about at least twice a week performing a typically cardio oriented exercise program, while 3 days a week performing resistance training. We also discussed variations on above suggested schedule. Also gave she information about exercising at home while gyms are closed and / or weather is bad.  she is  Complaining of  nothing  denies vomiting, abdominal pain, fussiness, diarrhea, cough, and difficulty breathing  Latex allergy  History reviewed. No pertinent past medical history. Past Surgical History:  Procedure Laterality Date  . Arthroscopic repair acl Right 2013  . Cesarean section     2014-2019-2021  . Sleeve gastrectomy  06/04/2022  . Wisdom tooth extraction     Current Outpatient Medications on File Prior to Visit  Medication Sig Dispense Refill  . aspirin  (ECOTRIN LOW DOSE) EC tablet Take one tablet (81 mg dose) by mouth daily. (Patient not taking: Reported on 06/05/2023)    . bismuth subsalicylate (PEPTO BISMOL) 262 mg/15 mL oral suspension Take 15 mLs by mouth every 6 (six) hours as needed for Indigestion.    . calcium carbonate (TUMS) 500 mg chewable tablet Chew two tablets (1,000 mg dose) by mouth 3 (three) times a day.    . cetirizine (ZYRTEC) 10 mg chewable tablet Chew one tablet (10 mg dose) by mouth as needed.    . Cholecalciferol (VITAMIN D) 50 MCG (2000 UT) CAPS Take two capsules by mouth 2 (two) times daily. (Patient not taking: Reported on 06/05/2023)    . COLOSTRUM PO Take by mouth.    . etonogestrel -ethinyl estradiol  (NUVARING) 0.12-0.015 MG/24HR vaginal ring Place one each vaginally every 28 (twenty-eight) days.    . Ferrous Fumarate  324 (106 Fe) MG TABS Take 106 mg of iron by mouth every other day.    . metoclopramide  HCl (REGLAN ) 10 mg tablet Take 1 tablet at 5am the morning of surgery and then every 6 to 8 hours as needed for nausea. (Patient not taking: Reported on 06/05/2023) 30 tablet 3  . Multiple Minerals-Vitamins (CALCIUM CITRATE +) TABS  Take by mouth. (Patient not taking: Reported on 06/05/2023)    . Multiple Vitamin (MULTIVITAMIN) tablet Take one tablet by mouth daily. (Patient not taking: Reported on 06/05/2023)    . omeprazole (PRILOSEC) 40 mg capsule Take one capsule (40 mg dose) by mouth daily. Take one capsule the night before surgery between 9pm and 11pm and then one capsule daily. (Patient not taking: Reported on 06/05/2023) 30 capsule 5  . ondansetron  (ZOFRAN -ODT)  8 mg disintegrating tablet Take one tablet (8 mg dose) by mouth every 12 (twelve) hours as needed for Nausea. (Patient not taking: Reported on 12/04/2022) 30 tablet 1  . Prenatal-FeAspGly-Methylfol-FA (PRENATE ELITE ) 20-0.6-0.4 MG TABS tablet Take one tablet by mouth daily. (Patient not taking: Reported on 06/05/2023)     No current facility-administered medications on file prior to visit.   Allergies: Allergies  Allergen Reactions  . Iodine  Rash  . Shellfish Allergy Hives and Rash   Social History   Socioeconomic History  . Marital status: Married  . Number of children: 3  Occupational History  . Occupation: Nail Tech  Tobacco Use  . Smoking status: Never    Passive exposure: Never  . Smokeless tobacco: Never  Vaping Use  . Vaping status: Never Used  Substance and Sexual Activity  . Alcohol use: Yes    Alcohol/week: 2.0 standard drinks of alcohol    Types: 2 Standard drinks or equivalent per week  . Drug use: Yes    Types: Marijuana   Family History  Problem Relation Age of Onset  . Hypertension Father    ROS: Vitamin Deficiencies denies evidence of calcium deficiency such as altered mental status, tetanus, generalized weakness denies evidence of L- Carnitine deficiency such as lipid intolerance, encephalopathy denies evidence of cobalamin deficiency such as general weakness, anemia denies evidence of copper deficiency including fatigue, bleeding under the skin, anemia, cardiomegaly denies evidence of folate deficiency including generalized weakness, anemia GI discomfort denies evidence of iron deficiency including fatigue shortness of breath malaise denies evidence of thiamine deficiency such as numbness in the fingers or toes, neuropathy, irritation, encephalopathy denies evidence of vitamin A deficiency such as night blindness denies evidence of vitamin D deficiency such as bone or joint pain, depression denies evidence of zinc deficiency such as skin disorders or hair  loss  Objective  BP 106/74 (BP Location: Right Upper Arm, Patient Position: Sitting)   Pulse 80   Ht 5' 3 (1.6 m)   Wt 169 lb (76.7 kg)   SpO2 95%   Breastfeeding Unknown   BMI 29.94 kg/m  Chaperone is not present for exam Physical Examination:  GENERAL ASSESSMENT: well developed and well nourished, obese SKIN: normal color, no lesions, non-icteric, no petechiae, no ecchymoses HEAD: normocephalic, atraumatic EYES: non-icteric sclerae EARS: normal external appearance NOSE: normal external appearance and nares patent MOUTH:  moist mucosa, no dry mucosa, no tongue coating, multiple teeth present and grossly normal NECK: normal supple full range of motion and no adenopathy or masses, trachea midline, no carotid bruits, no JVD CHEST: normal air exchange, no rales, no rhonchi, no wheezes, respiratory effort normal with no retractions HEART: regular rate and rhythm, normal S1/S2, no murmurs, no thrills, no rubs, no gallops ABDOMEN: soft, obese, non-distended, no masses, no hepatosplenomegaly, incisions are well healed with no herniation EXTREMITY: normal and symmetric movement, normal range of motion, no joint swelling NEURO: strength normal and symmetric, alert and oriented X 3, gait normal Psycho/social: normal affect and behavior  Office Visit on 06/05/2023  Component  Date Value Ref Range Status  . WBC 06/05/2023 5.6  3.4 - 10.8 x10E3/uL Final  . RBC 06/05/2023 5.13  3.77 - 5.28 x10E6/uL Final  . Hemoglobin 06/05/2023 12.6  11.1 - 15.9 g/dL Final  . Hematocrit 98/89/7974 40.0  34.0 - 46.6 % Final  . MCV 06/05/2023 78 (L)  79 - 97 fL Final  . MCH 06/05/2023 24.6 (L)  26.6 - 33.0 pg Final  . MCHC 06/05/2023 31.5  31.5 - 35.7 g/dL Final  . RDW 98/89/7974 14.4  11.7 - 15.4 % Final  . Platelet Count 06/05/2023 354  150 - 450 x10E3/uL Final  . Neutrophils 06/05/2023 47  Not Estab. % Final  . Lymphs Relative 06/05/2023 34  Not Estab. % Final  . Monocytes 06/05/2023 11  Not Estab. %  Final  . Eos Relative 06/05/2023 7  Not Estab. % Final  . Basos Relative  06/05/2023 1  Not Estab. % Final  . Neutrophils Absolute 06/05/2023 2.7  1.4 - 7.0 x10E3/uL Final  . Lymphocytes Absolute 06/05/2023 1.9  0.7 - 3.1 x10E3/uL Final  . Monocytes Absolute 06/05/2023 0.6  0.1 - 0.9 x10E3/uL Final  . Eosinophils Absolute 06/05/2023 0.4  0.0 - 0.4 x10E3/uL Final  . Basophils Absolute 06/05/2023 0.0  0.0 - 0.2 x10E3/uL Final  . Immature Granulocytes 06/05/2023 0  Not Estab. % Final  . Immature Grans (Abs) 06/05/2023 0.0  0.0 - 0.1 x10E3/uL Final   Assessment   1. BMI 29.0-29.9,adult   2. History of sleeve gastrectomy   3. Postgastrectomy malabsorption    Plan  Monica [name] has done well post bariatric surgery. she is status post laparoscopic sleeve gastrectomy at [facility]. she has been compliant with our bariatric post operative requirements.  Current BMI is Body mass index is 29.94 kg/m..  she has had resolution or, at least, improvement of her obesity related co-morbidities.  Labs to be drawn today  she has obesity-related medical problems including:  1. BMI 29.0-29.9,adult   2. History of sleeve gastrectomy   3. Postgastrectomy malabsorption    .  Exercise, including three times per week resistance training stressed along with at least twice per week of cardio related exercise. We discussed in detail the differences and advantages of each..  Protein intake of 80+ gms per day stressed. Offered if patient had any concerns or confusion that we arrange follow up appointment with nutritionist.  Follow up in 1 year with labs.  Lifestyle and supplementation per AVS.   I have reviewed the discharge plans and course since discharge. After discussion, the patient understands these plans and questions related to the plans have been addressed

## 2023-06-25 ENCOUNTER — Encounter: Payer: Self-pay | Admitting: Advanced Practice Midwife

## 2023-06-25 ENCOUNTER — Ambulatory Visit: Payer: Medicaid Other | Admitting: Obstetrics & Gynecology

## 2023-06-25 VITALS — BP 113/83 | HR 72 | Ht 62.0 in | Wt 169.4 lb

## 2023-06-25 DIAGNOSIS — N61 Mastitis without abscess: Secondary | ICD-10-CM

## 2023-06-25 MED ORDER — DICLOXACILLIN SODIUM 250 MG PO CAPS: 250.0000 mg | ORAL_CAPSULE | Freq: Four times a day (QID) | ORAL | 0 refills | Status: AC

## 2023-06-25 NOTE — Progress Notes (Signed)
    GYNECOLOGY PROGRESS NOTE  Subjective:    Patient ID: Monica [name], female    DOB: 07/20/1995, 28 y.o.   MRN: [id number]  HPI  Patient is a 28 y.o. [id number] is here with a 3 day h/o a very painful lump in her right breast. It is much more painful with touch. She is 3 months postpartum and is pumping q 3 hours. Her milk production has not changed. She has no other complaints, no fever or chills.  The following portions of the patient's history were reviewed and updated as appropriate: allergies, current medications, past family history, past medical history, past social history, past surgical history, and problem list.  Review of Systems Pertinent items are noted in HPI.   Objective:   Blood pressure 113/83, pulse 72, height 5\' 2"  (1.575 m), weight 169 lb 6.4 oz (76.8 kg), currently breastfeeding. Body mass index is 30.98 kg/m. General appearance: alert Her breasts appear similar in the seated position. Her left breast has a normal exam. Her right breast has a very tender 4-5 cm lump in the upper outer quadrant c/w a clogged milk duct and impending mastitis.  Assessment:   Early mastitis, clogged milk duct  Plan:   I have encouraged frequent pumping, application of heat and I will prescribe dicloxacillin. She will come back in a week if she is not significantly better

## 2024-02-02 ENCOUNTER — Ambulatory Visit (INDEPENDENT_AMBULATORY_CARE_PROVIDER_SITE_OTHER): Admitting: Certified Nurse Midwife

## 2024-02-02 ENCOUNTER — Encounter: Payer: Self-pay | Admitting: Certified Nurse Midwife

## 2024-02-02 VITALS — BP 123/84 | HR 77 | Wt 180.2 lb

## 2024-02-02 DIAGNOSIS — Z975 Presence of (intrauterine) contraceptive device: Secondary | ICD-10-CM | POA: Diagnosis not present

## 2024-02-02 DIAGNOSIS — N921 Excessive and frequent menstruation with irregular cycle: Secondary | ICD-10-CM | POA: Diagnosis not present

## 2024-02-02 DIAGNOSIS — N946 Dysmenorrhea, unspecified: Secondary | ICD-10-CM

## 2024-02-02 LAB — POCT URINE PREGNANCY: Preg Test, Ur: NEGATIVE

## 2024-02-02 NOTE — Progress Notes (Unsigned)
 IUD placed 10 months ago   Concerned about pain during intercourse, bleeding following intercourse and cramping on the right side. Pt states spotting has occurred every since then. This first occurred 2 months ago and twice since. All three times following intercourse.

## 2024-02-07 NOTE — Progress Notes (Signed)
 GYNECOLOGY OFFICE VISIT NOTE  History:   Monica [name] is a 28 y.o. [id number] here today for concerns with her IUD. Patient reports that occasionally after intercourse she will having some spotting and abdominal cramping after intercourse. She also reports some irritation on her partners penis after intercourse and wonders if the strings are causing it. She denies any abnormal vaginal discharge or other concerns. Patient does not desire removal today.     Past Medical History:  Diagnosis Date   Anxiety    Chronic hypertension affecting pregnancy 12/08/2017   [x ] Aspirin  81 mg daily after 12 weeks  Current antihypertensives:  None      Baseline and surveillance labs (pulled in from [facility], refresh links as needed)       Lab Results  Component  Value  Date     PLT  332  09/08/2019     CREATININE  0.46 (L)  09/08/2019     AST  14  09/08/2019     ALT  9  09/08/2019     PROTCRRATIO  11.61 (H)  11/03/2012            UPC                                          Chronic hypertension affecting pregnancy 09/15/2017   Galerius.Gant ] Aspirin  81 mg daily after 12 weeks; discontinue after 36 weeks  Current antihypertensives:  None      Baseline and surveillance labs (pulled in from [facility], refresh links as needed)       Lab Results  Component  Value  Date     PLT  322  09/15/2017     CREATININE  0.42 (L)  09/15/2017     AST  13  09/15/2017     ALT  9  09/15/2017     PROTCRRATIO  11.61 (H)  11/03/2012        Antenatal Testing    Depression    H/O pre-eclampsia in prior pregnancy, currently pregnant 11/10/2017   First pregnancy   Headache    Hypertension    Pregnancy induced hypertension    Vaginal Pap smear, abnormal    Vitamin D deficiency 03/10/2022    Past Surgical History:  Procedure Laterality Date   arthoscopic knee Right 2013   CESAREAN SECTION N/A 11/05/2012   Procedure: CESAREAN SECTION;  Surgeon: Olam Mill, MD;  Location: [facility] ORS;  Service: Obstetrics;  Laterality: N/A;   CESAREAN  SECTION N/A 04/02/2018   Procedure: CESAREAN SECTION;  Surgeon: Cris Burnard DEL, MD;  Location: [facility] BIRTHING SUITES;  Service: Obstetrics;  Laterality: N/A;   CESAREAN SECTION N/A 03/06/2020   Procedure: CESAREAN SECTION;  Surgeon: Barbra Lang PARAS, DO;  Location: [facility] LD ORS;  Service: Obstetrics;  Laterality: N/A;   CESAREAN SECTION N/A 03/23/2023   Procedure: CESAREAN SECTION;  Surgeon: Barbra Lang PARAS, DO;  Location: [facility] LD ORS;  Service: Obstetrics;  Laterality: N/A;   COLPOSCOPY  05/07/2016   LAPAROSCOPIC GASTRIC SLEEVE RESECTION  06/04/2022    The following portions of the patient's history were reviewed and updated as appropriate: allergies, current medications, past family history, past medical history, past social history, past surgical history and problem list.   Health Maintenance:  Normal pap and negative HRHPV on 05/14/2022.   Review of Systems:  Pertinent items noted in HPI and remainder  of comprehensive ROS otherwise negative.  Physical Exam:  BP 123/84   Pulse 77   Wt 180 lb 3.2 oz (81.7 kg)   Breastfeeding No   BMI 32.96 kg/m  CONSTITUTIONAL: Well-developed, well-nourished female in no acute distress.  HEENT:  Normocephalic, atraumatic. External right and left ear normal. No scleral icterus.  NECK: Normal range of motion, supple, no masses noted on observation SKIN: No rash noted. Not diaphoretic. No erythema. No pallor. MUSCULOSKELETAL: Normal range of motion. No edema noted. NEUROLOGIC: Alert and oriented to person, place, and time. Normal muscle tone coordination. No cranial nerve deficit noted. PSYCHIATRIC: Normal mood and affect. Normal behavior. Normal judgment and thought content. CARDIOVASCULAR: Normal heart rate noted RESPIRATORY: Effort and breath sounds normal, no problems with respiration noted ABDOMEN: No masses noted. No other overt distention noted.   PELVIC: Normal appearing external genitalia; normal urethral meatus; normal appearing vaginal mucosa  and cervix.  No abnormal discharge noted.  Normal uterine size, no other palpable masses, no uterine or adnexal tenderness. Unable to visualize strings. Patient denies IUD falling out.  Performed in the presence of a chaperone  Labs and Imaging Results for orders placed or performed in visit on 02/02/24 (from the past week)  POCT urine pregnancy   Collection Time: 02/02/24  4:51 PM  Result Value Ref Range   Preg Test, Ur Negative Negative   No results found.    Assessment and Plan:    1. Breakthrough bleeding with IUD (Primary) 3. Dysmenorrhea - Reviewed physiologic changes that occur during ovulation that affect uterine/cervical positions in the pelvis.  - Recommended to incorporate more comfortable positions during intercourse.   2. IUD (intrauterine device) in place - Pregnancy test negative.  - IUD strings not visualized but assumed in place. Attempted to tease strings out but unsuccessful. - POCT urine pregnancy   Routine preventative health maintenance measures emphasized. Please refer to After Visit Summary for other counseling recommendations.   Return in about 1 year (around 02/01/2025) for [place].    I spent 30 minutes dedicated to the care of this patient including pre-visit review of records, face to face time with the patient discussing her conditions and treatments and post visit orders.    Matyas Baisley Erven) Emilio, MSN, CNM  Center for [facility]  02/07/24 11:53 PM
